# Patient Record
Sex: Male | Born: 1960 | Race: White | Hispanic: No | Marital: Married | Smoking: Never smoker
Health system: Southern US, Community
[De-identification: ages and names within clinical notes are randomized; demographics above are authoritative.]

## PROBLEM LIST (undated history)

## (undated) DIAGNOSIS — M419 Scoliosis, unspecified: Secondary | ICD-10-CM

## (undated) DIAGNOSIS — I10 Essential (primary) hypertension: Secondary | ICD-10-CM

## (undated) DIAGNOSIS — M4310 Spondylolisthesis, site unspecified: Secondary | ICD-10-CM

## (undated) DIAGNOSIS — C4490 Unspecified malignant neoplasm of skin, unspecified: Secondary | ICD-10-CM

## (undated) DIAGNOSIS — G8929 Other chronic pain: Secondary | ICD-10-CM

## (undated) DIAGNOSIS — G4733 Obstructive sleep apnea (adult) (pediatric): Secondary | ICD-10-CM

## (undated) DIAGNOSIS — F1090 Alcohol use, unspecified, uncomplicated: Secondary | ICD-10-CM

## (undated) DIAGNOSIS — M48062 Spinal stenosis, lumbar region with neurogenic claudication: Secondary | ICD-10-CM

## (undated) DIAGNOSIS — M109 Gout, unspecified: Secondary | ICD-10-CM

## (undated) DIAGNOSIS — L570 Actinic keratosis: Secondary | ICD-10-CM

## (undated) DIAGNOSIS — Z789 Other specified health status: Secondary | ICD-10-CM

## (undated) DIAGNOSIS — N529 Male erectile dysfunction, unspecified: Secondary | ICD-10-CM

## (undated) DIAGNOSIS — E785 Hyperlipidemia, unspecified: Secondary | ICD-10-CM

## (undated) DIAGNOSIS — R7303 Prediabetes: Secondary | ICD-10-CM

## (undated) HISTORY — PX: KNEE ARTHROSCOPY: SUR90

## (undated) HISTORY — DX: Obstructive sleep apnea (adult) (pediatric): G47.33

## (undated) HISTORY — DX: Actinic keratosis: L57.0

## (undated) HISTORY — PX: CARPAL TUNNEL RELEASE: SHX101

## (undated) HISTORY — PX: COLONOSCOPY W/ POLYPECTOMY: SHX1380

## (undated) HISTORY — DX: Hyperlipidemia, unspecified: E78.5

## (undated) HISTORY — DX: Unspecified malignant neoplasm of skin, unspecified: C44.90

## (undated) HISTORY — DX: Other specified health status: Z78.9

## (undated) HISTORY — DX: Alcohol use, unspecified, uncomplicated: F10.90

## (undated) HISTORY — DX: Male erectile dysfunction, unspecified: N52.9

## (undated) HISTORY — DX: Essential (primary) hypertension: I10

## (undated) HISTORY — PX: ROTATOR CUFF REPAIR: SHX139

## (undated) HISTORY — DX: Prediabetes: R73.03

## (undated) HISTORY — DX: Gout, unspecified: M10.9

## (undated) HISTORY — PX: JOINT REPLACEMENT: SHX530

## (undated) HISTORY — PX: KNEE ARTHROSCOPY W/ MENISCECTOMY: SHX1879

## (undated) HISTORY — PX: OLECRANON BURSECTOMY: SHX2097

## (undated) HISTORY — PX: REPLACEMENT TOTAL KNEE: SUR1224

---

## 2004-11-21 ENCOUNTER — Ambulatory Visit: Payer: Self-pay | Admitting: Internal Medicine

## 2008-04-06 ENCOUNTER — Ambulatory Visit: Payer: Self-pay | Admitting: Internal Medicine

## 2008-04-06 IMAGING — CR DG ABDOMEN 1V
1 series · 2 of 2 positions shown · non-contrast
Comparison: none

REASON FOR EXAM: abd pain
COMMENTS:

[Series 1: view not recorded · 0.17mm/px · 2 of 2 slices shown]
[im 1/2]
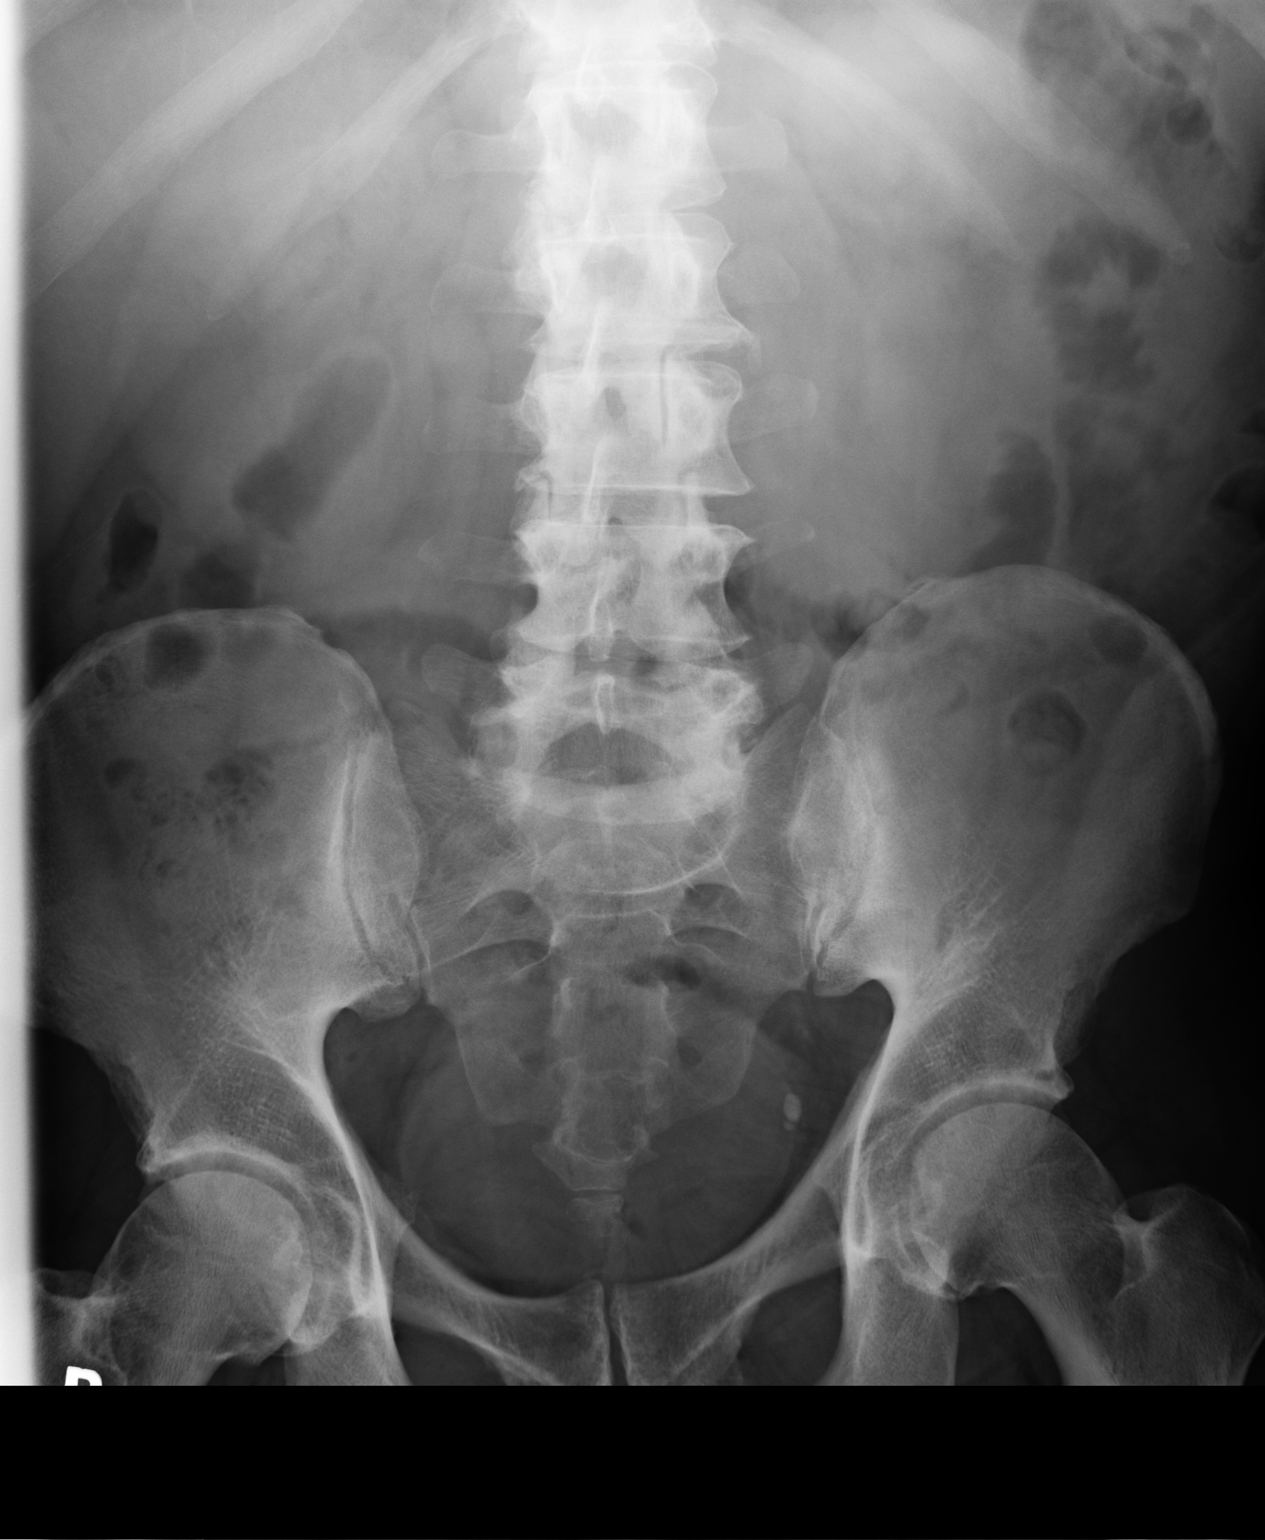
[im 2/2]
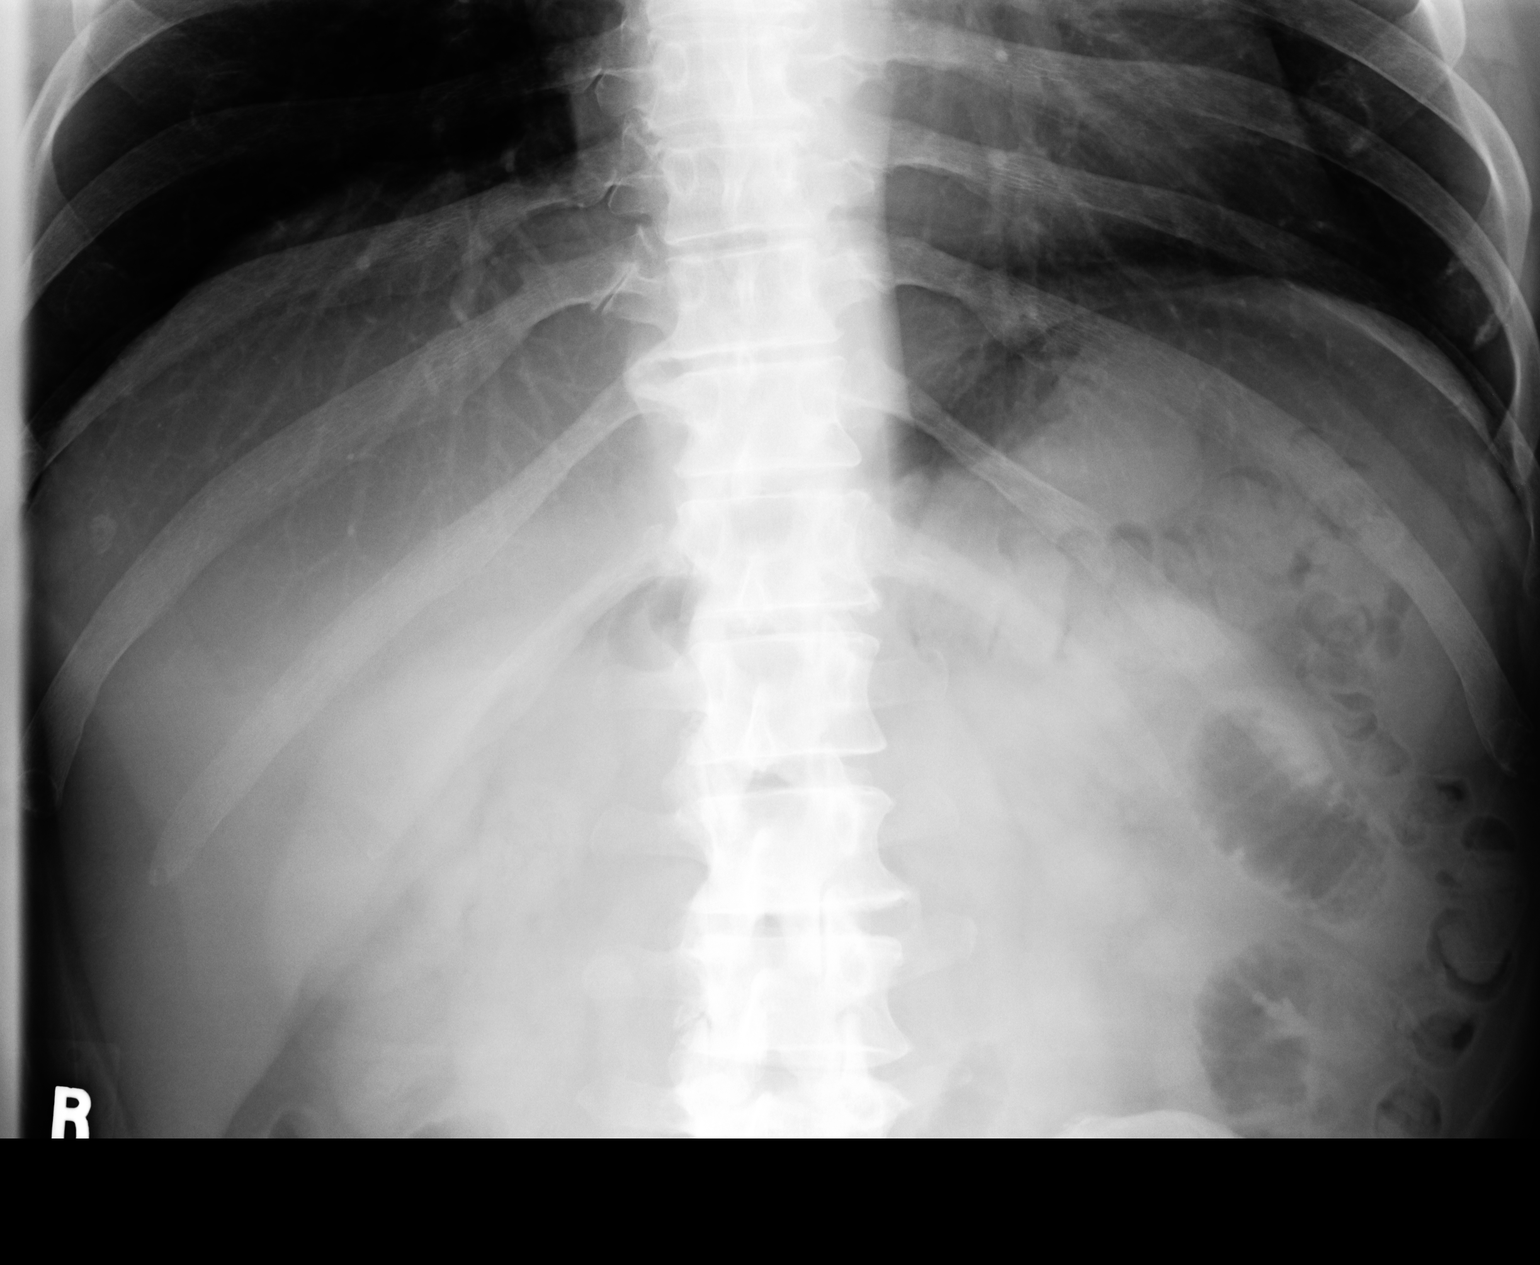

[2 of 2 positions shown; findings below may reference images not displayed]

PROCEDURE:     DXR - DXR KIDNEY URETER BLADDER  - April 06, 2008  [DATE]

RESULT:     The lumbar vertebral bodies exhibit very mild degenerative
change. There are two dense calcifications the left aspect of the pelvis
which likely reflect phleboliths, but the possibility of a distal left
ureteral stone is raised. A smaller calcification is seen just inferior to
the  larger calcification. The largest calcification measures roughly 7 mm
in greatest dimension while the smaller measures roughly 2 mm in greatest
dimension. The bowel gas pattern exhibits no acute abnormality.
IMPRESSION: There are calcifications in left aspect of the pelvis which
may reflect the presence of 1 or 2 stones measuring 2 mm and/or 7 mm
respectively. Followup IVP or CT scanning would be a useful next step given
that there is abdominal discomfort.

Attempts were made to call this report, but all receptionists were busy.

## 2008-04-06 IMAGING — US ABDOMEN ULTRASOUND
1 series · 17 of 25 positions shown · non-contrast
Comparison: none

REASON FOR EXAM: abdominal pain      call report  [phone]   [phone]
pager  [phone]
COMMENTS:

[Series 1: abdomen ultrasound · 17 of 85 slices shown]
[im 1/85]
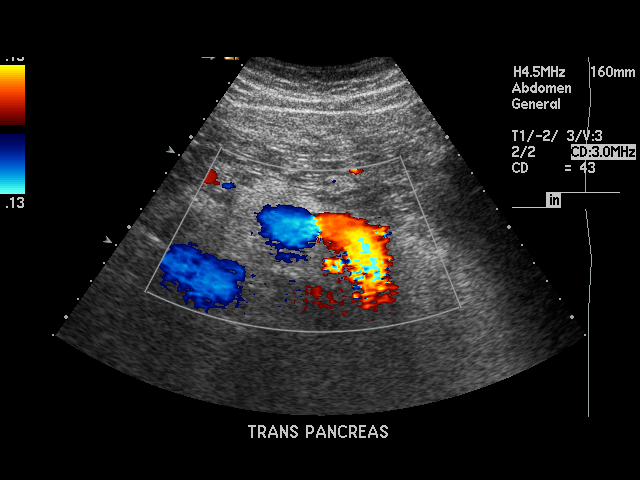
[im 8/85]
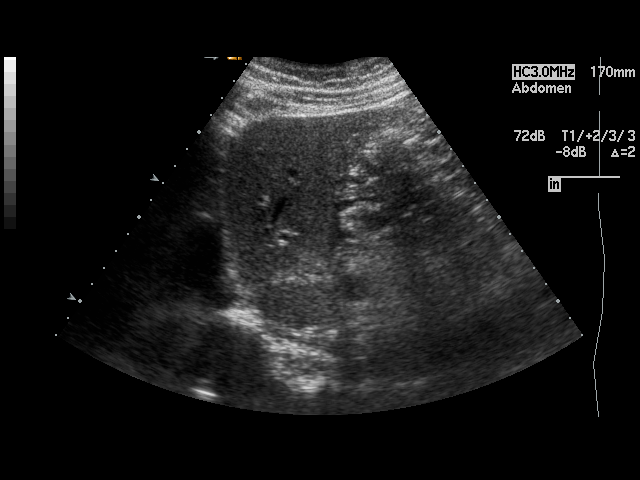
[im 11/85]
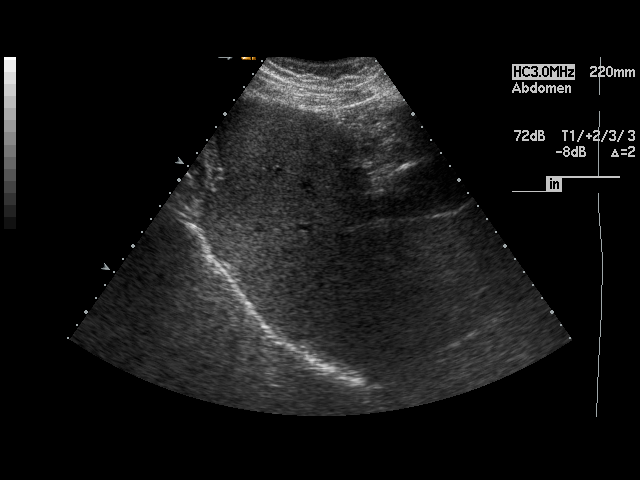
[im 18/85]
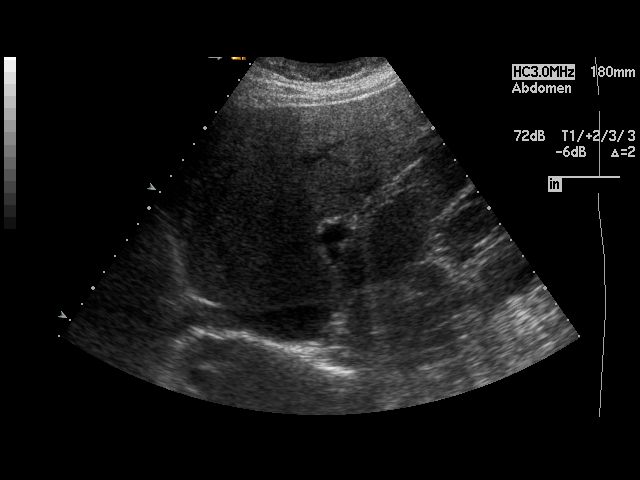
[im 22/85]
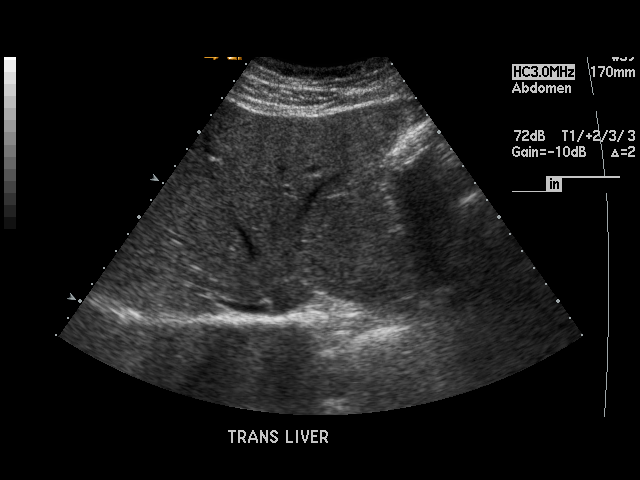
[im 29/85]
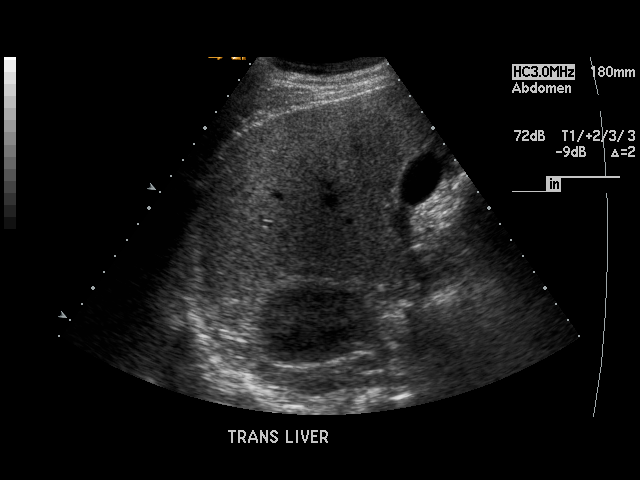
[im 32/85]
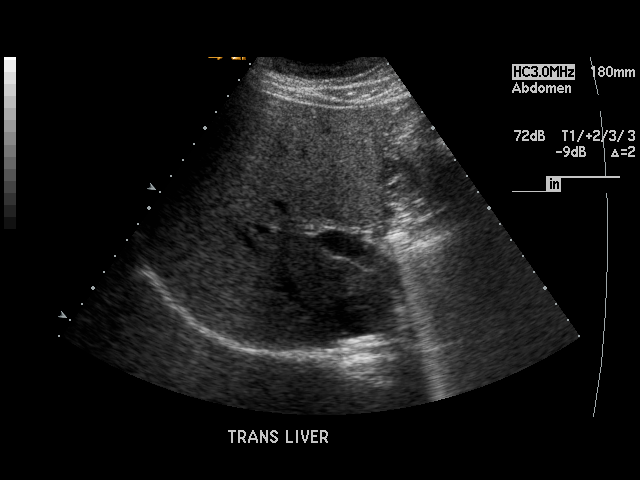
[im 39/85]
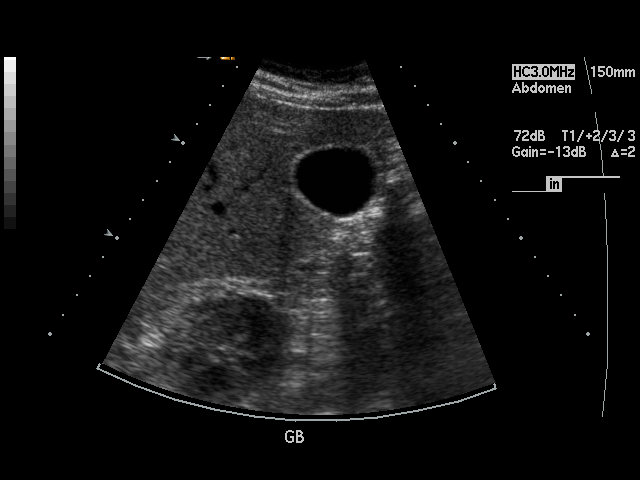
[im 43/85]
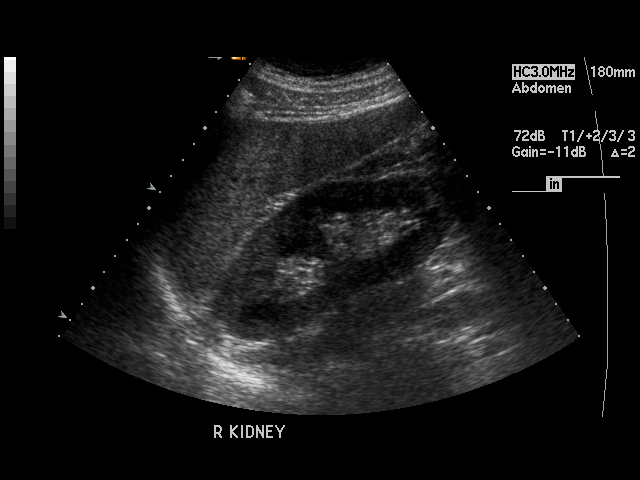
[im 46/85]
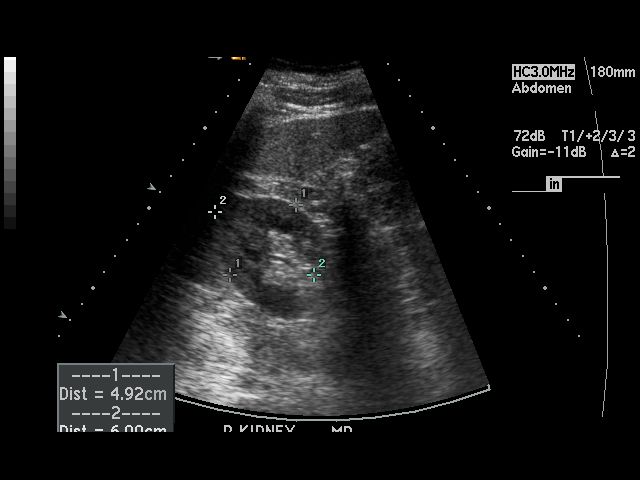
[im 53/85]
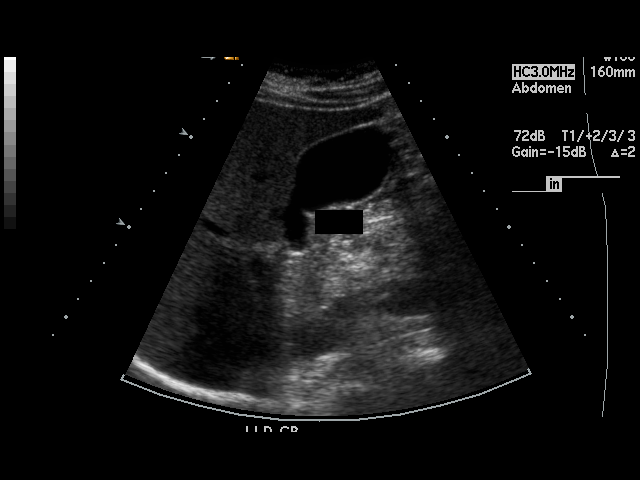
[im 57/85]
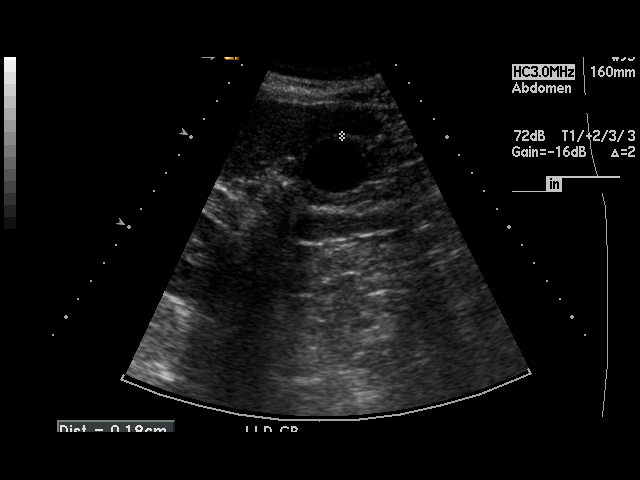
[im 64/85]
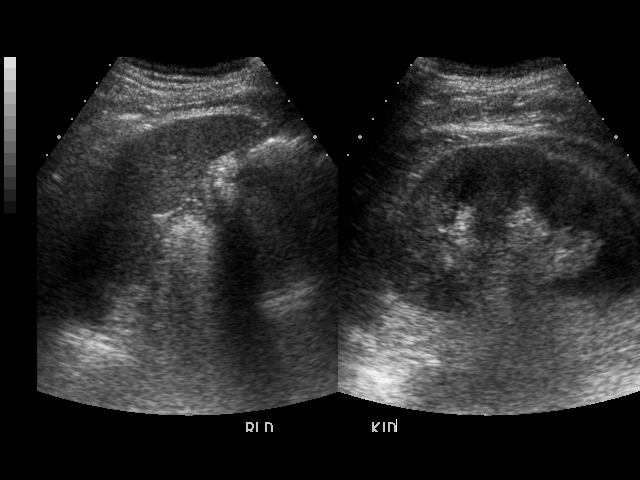
[im 67/85]
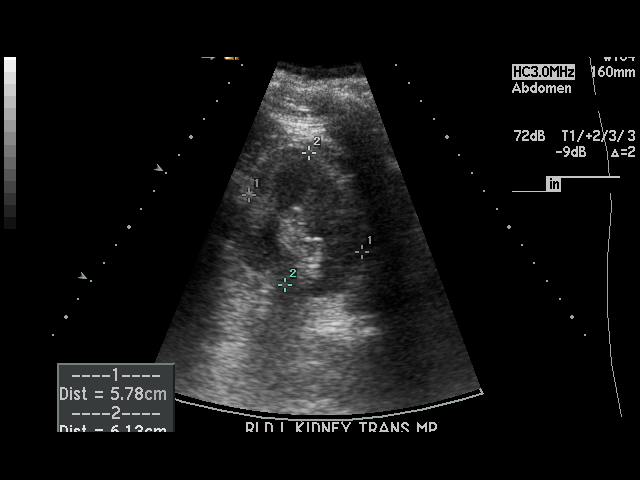
[im 74/85]
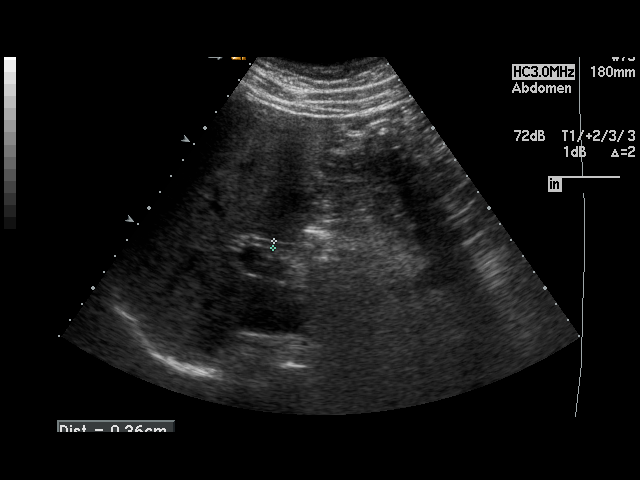
[im 78/85]
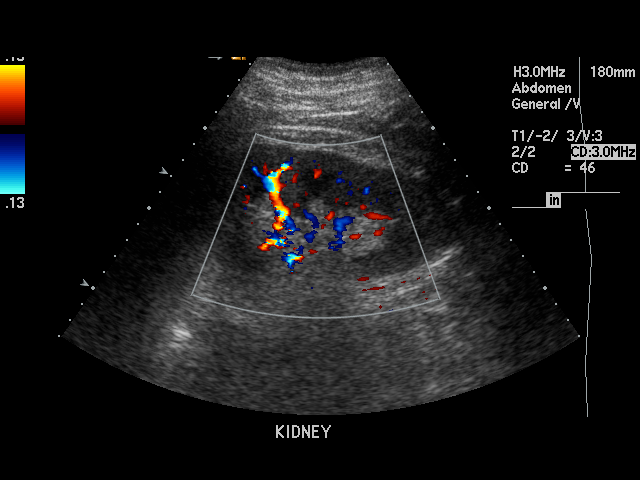
[im 85/85]
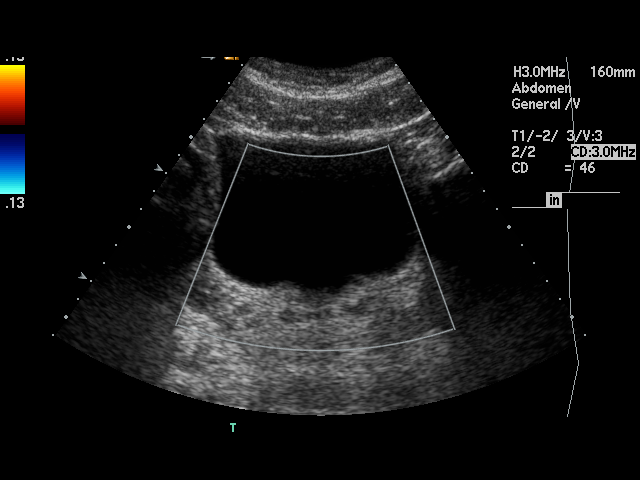

[17 of 25 positions shown; findings below may reference images not displayed]

PROCEDURE:     US  - US ABDOMEN GENERAL SURVEY  - April 06, 2008  [DATE]

RESULT:     The liver exhibits normal echotexture with no focal mass nor
ductal dilation. Portal venous flow is normal in direction toward the liver.
The gallbladder is adequately distended with no evidence of stones, wall
thickening, or pericholecystic fluid. The common bile duct is normal at
mm in diameter. There is no sonographic Murphy's sign.

The pancreas, spleen, abdominal aorta, and kidneys are normal in appearance.
IMPRESSION: Normal abdominal ultrasound examination.

## 2008-04-09 ENCOUNTER — Ambulatory Visit: Payer: Self-pay | Admitting: Internal Medicine

## 2008-04-09 IMAGING — CT CT STONE STUDY
1 of 2 series · 16 of 32 positions shown, 20 images · non-contrast
Comparison: none

REASON FOR EXAM: abd and flank pain, elev creatinine and  hematuria  call
COMMENTS:

PROCEDURE:     CT  - CT ABDOMEN /PELVIS WO (STONE)  - April 09, 2008  [DATE]
RESULT:
HISTORY: LEFT flank pain.

[Series 2: stone · axial · 0.77mm/px · z∈[-985,-535]mm · 16 of 164 slices shown, 20 images]
[im 7/164  soft-tissue]
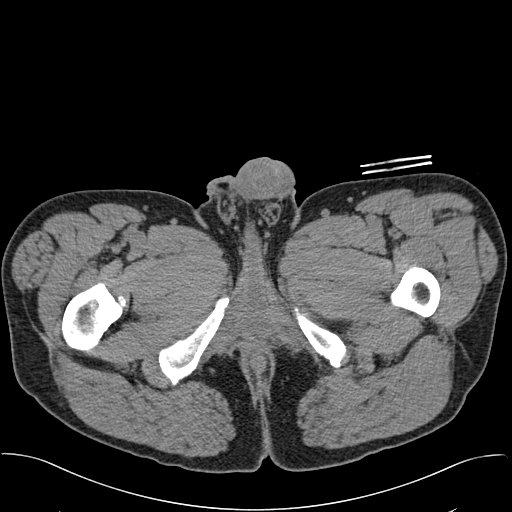
[im 7/164  bone]
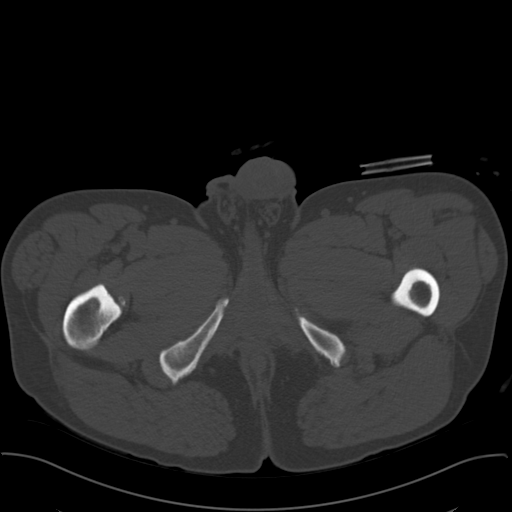
[im 20/164  soft-tissue]
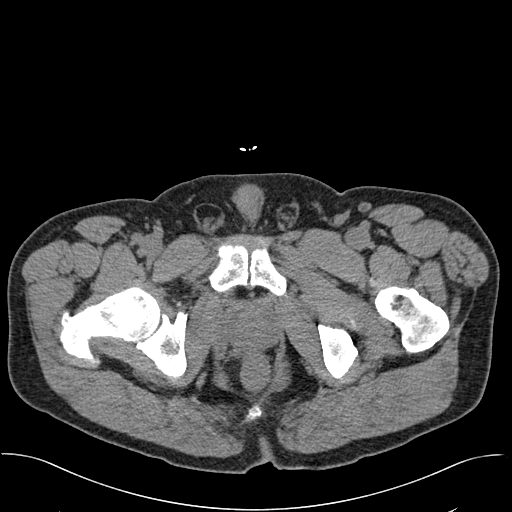
[im 33/164  soft-tissue]
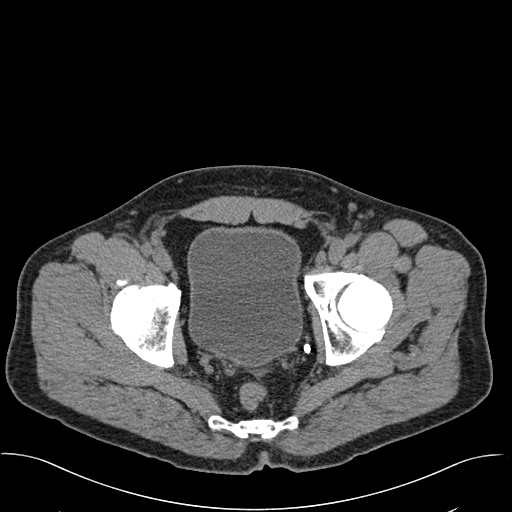
[im 46/164  soft-tissue]
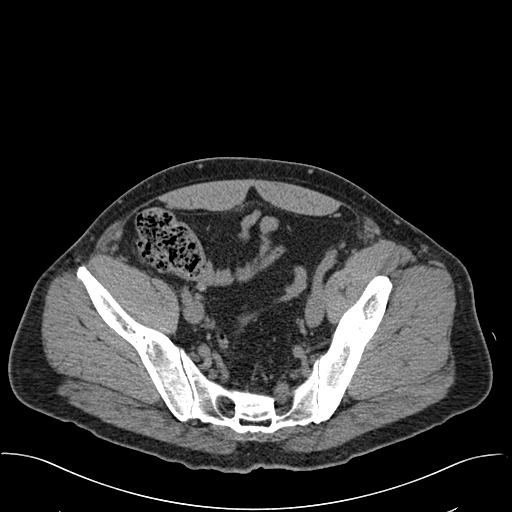
[im 53/164  soft-tissue]
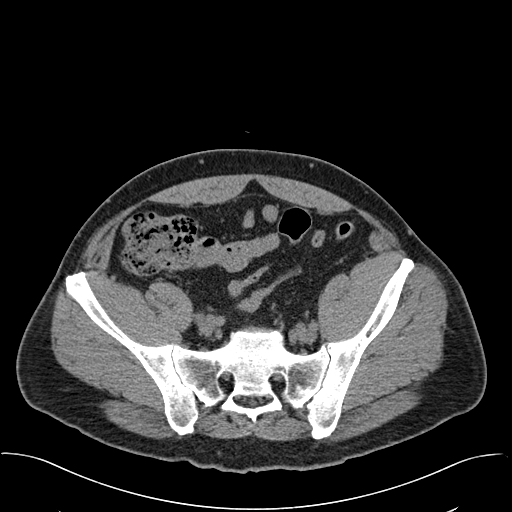
[im 66/164  soft-tissue]
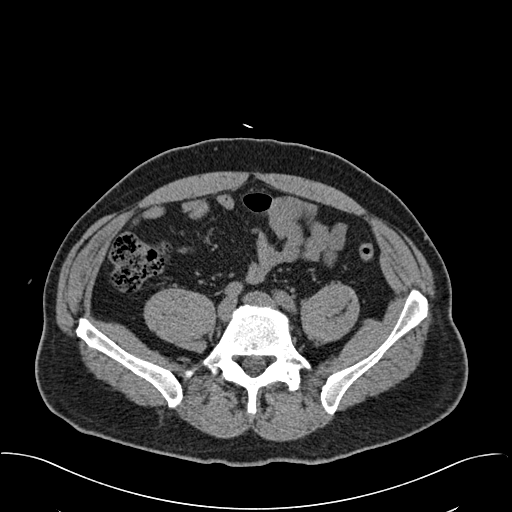
[im 79/164  soft-tissue]
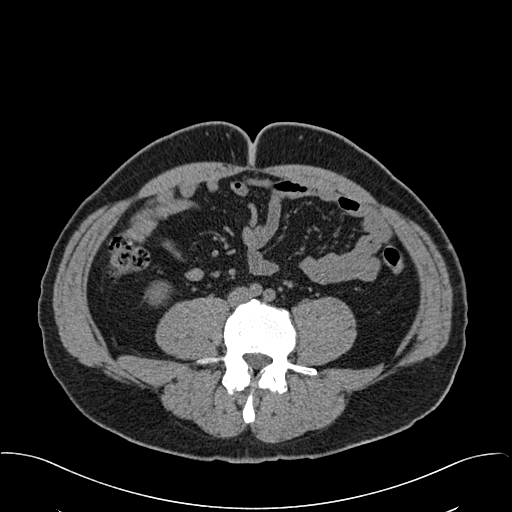
[im 85/164  soft-tissue]
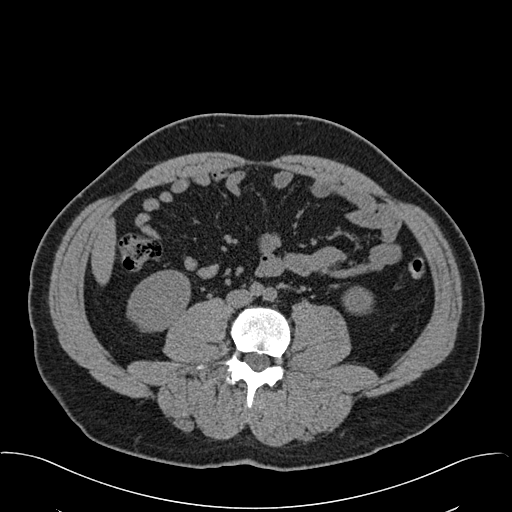
[im 98/164  soft-tissue]
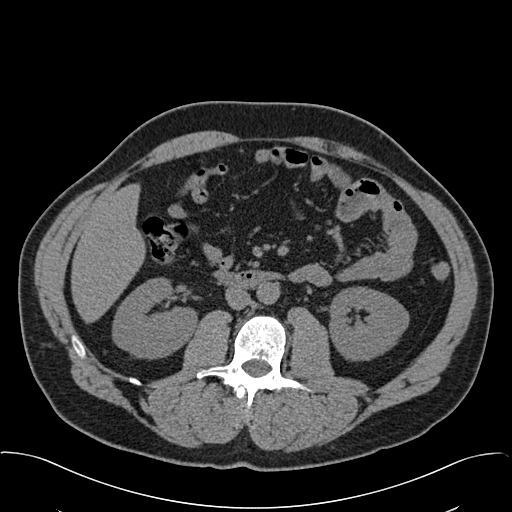
[im 98/164  bone]
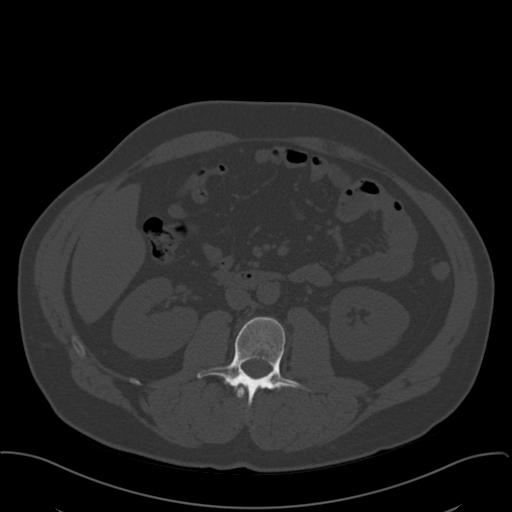
[im 111/164  soft-tissue]
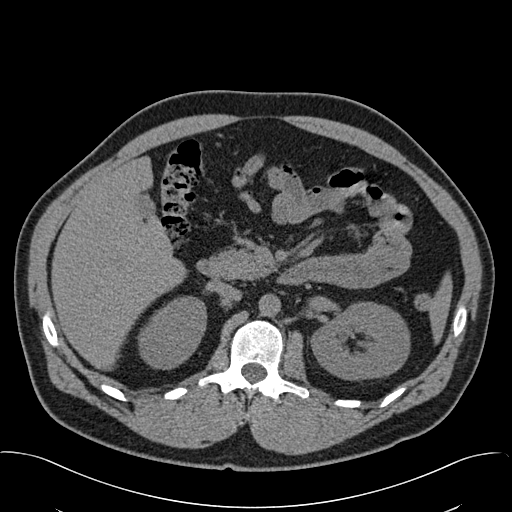
[im 124/164  soft-tissue]
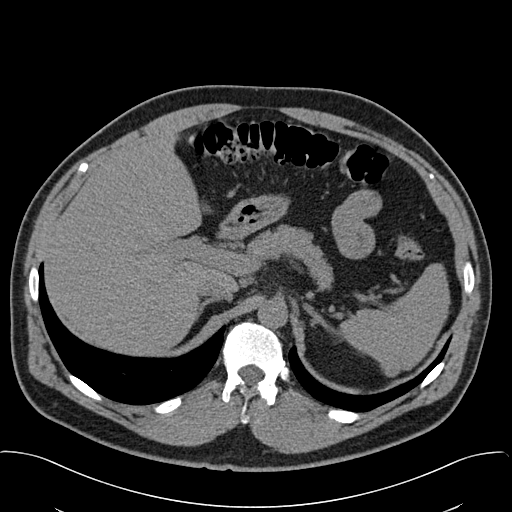
[im 131/164  soft-tissue]
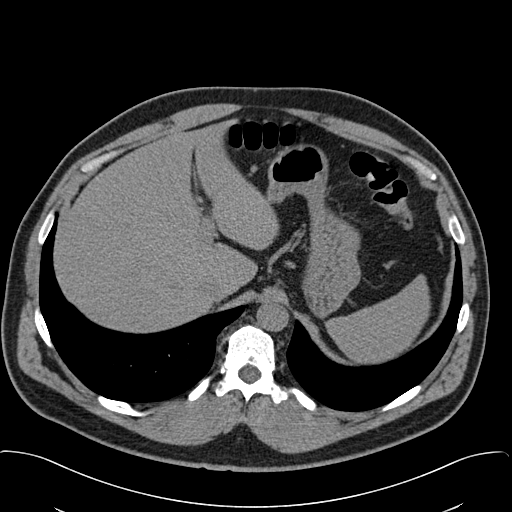
[im 137/164  lung]
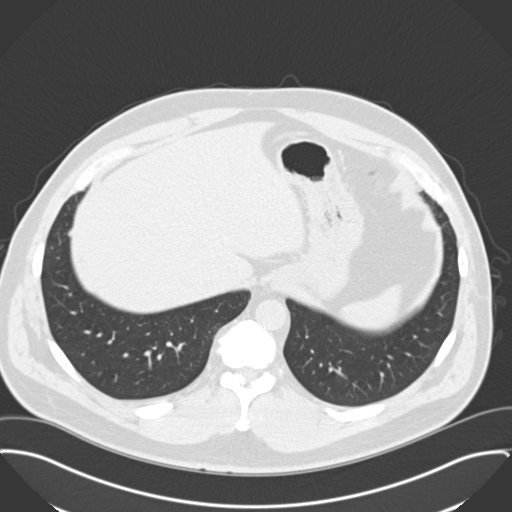
[im 144/164  soft-tissue]
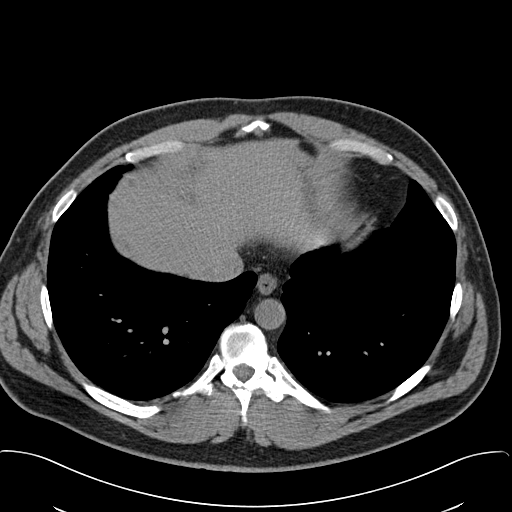
[im 144/164  lung]
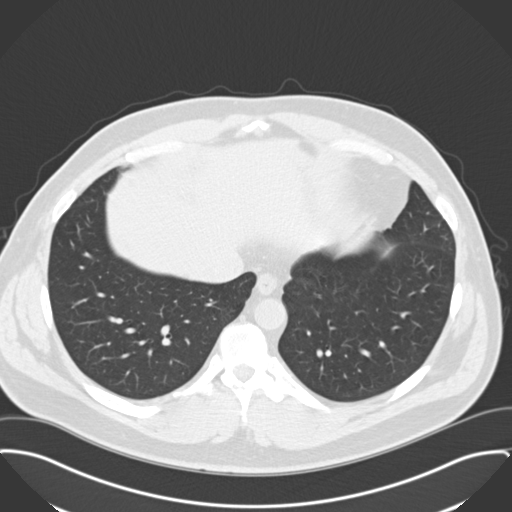
[im 150/164  lung]
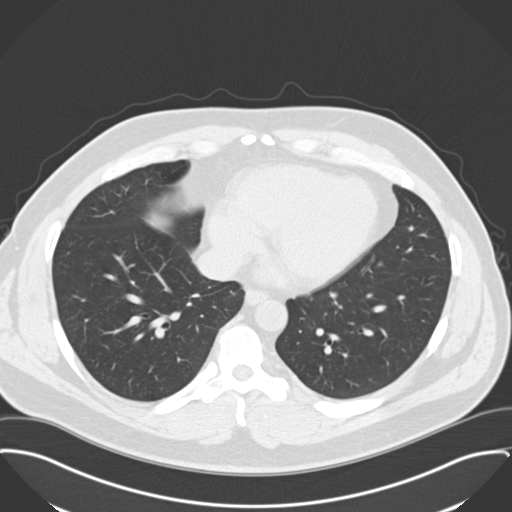
[im 157/164  soft-tissue]
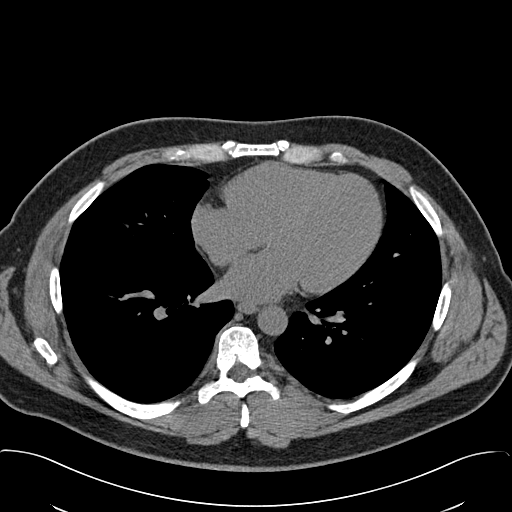
[im 157/164  lung]
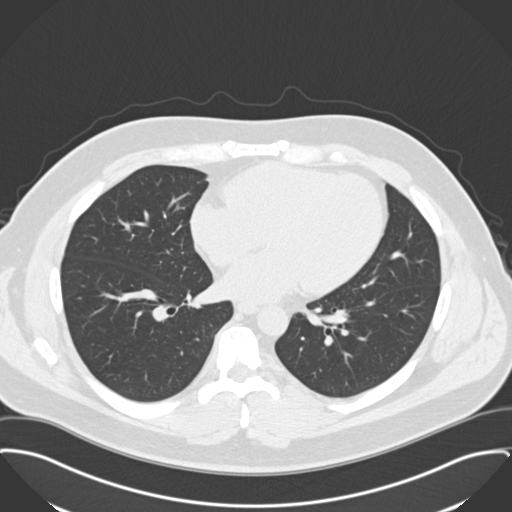

[16 of 32 positions shown; findings below may reference images not displayed]

COMPARISON STUDIES:  No recent.

PROCEDURE AND FINDINGS:   Nonenhanced CT of the abdomen and pelvis was
obtained.  The liver and spleen are normal.  The pancreas is normal. The
adrenals are normal. No hydronephrosis is noted. No ureteral stones are
noted. The bladder is unremarkable. No inguinal adenopathy is noted. The
appendix is normal.  Bilateral inguinal hernias are noted. The lung bases
are clear.
IMPRESSION: No acute or focal abnormality is identified.

## 2011-07-03 DIAGNOSIS — I10 Essential (primary) hypertension: Secondary | ICD-10-CM | POA: Insufficient documentation

## 2011-07-03 DIAGNOSIS — E669 Obesity, unspecified: Secondary | ICD-10-CM | POA: Insufficient documentation

## 2011-07-03 DIAGNOSIS — Z789 Other specified health status: Secondary | ICD-10-CM | POA: Insufficient documentation

## 2011-07-03 DIAGNOSIS — E782 Mixed hyperlipidemia: Secondary | ICD-10-CM | POA: Insufficient documentation

## 2012-07-15 ENCOUNTER — Emergency Department: Payer: Self-pay | Admitting: Emergency Medicine

## 2012-07-15 ENCOUNTER — Ambulatory Visit: Payer: Self-pay | Admitting: Family Medicine

## 2012-07-15 LAB — CBC WITH DIFFERENTIAL/PLATELET
Basophil #: 0.1 10*3/uL (ref 0.0–0.1)
Basophil %: 1 %
Eosinophil #: 0.2 10*3/uL (ref 0.0–0.7)
Eosinophil %: 3.3 %
Lymphocyte #: 1.7 10*3/uL (ref 1.0–3.6)
Lymphocyte %: 31.7 %
MCH: 30.7 pg (ref 26.0–34.0)
MCV: 90 fL (ref 80–100)
Monocyte #: 0.5 x10 3/mm (ref 0.2–1.0)
Monocyte %: 9 %
Neutrophil #: 2.9 10*3/uL (ref 1.4–6.5)
RBC: 4.76 10*6/uL (ref 4.40–5.90)
RDW: 13.4 % (ref 11.5–14.5)
WBC: 5.3 10*3/uL (ref 3.8–10.6)

## 2012-07-15 LAB — BASIC METABOLIC PANEL
Anion Gap: 10 (ref 7–16)
BUN: 18 mg/dL (ref 7–18)
Chloride: 103 mmol/L (ref 98–107)
Co2: 25 mmol/L (ref 21–32)
Creatinine: 1.08 mg/dL (ref 0.60–1.30)
EGFR (African American): >60
Osmolality: 277 (ref 275–301)
Potassium: 3.8 mmol/L (ref 3.5–5.1)
Sodium: 138 mmol/L (ref 136–145)

## 2012-07-15 LAB — CREATININE, SERUM: EGFR (Non-African Amer.): >60

## 2012-07-15 LAB — LIPASE, BLOOD: Lipase: 176 U/L (ref 73–393)

## 2012-07-15 IMAGING — CT CT ABD-PELV W/ CM
1 of 2 series · 15 of 32 positions shown, 19 images · IV contrast (isovue)
Comparison: none

REASON FOR EXAM: Left Lower abdominal pain, possible diverticulitis.
COMMENTS:

PROCEDURE:     CT  - CT ABDOMEN / PELVIS  W  - July 15, 2012 [DATE]
RESULT:     Comparison:  04/09/2008
TECHNIQUE: Multiple axial images of the abdomen and pelvis were performed
from the lung bases to the pubic symphysis, with p.o. contrast and with 100
mL of Isovue 370 intravenous contrast.

[Series 2: 3mm soft tissue · axial · 0.86mm/px · z∈[-544,-115]mm · 15 of 157 slices shown, 19 images]
[im 7/157  soft-tissue]
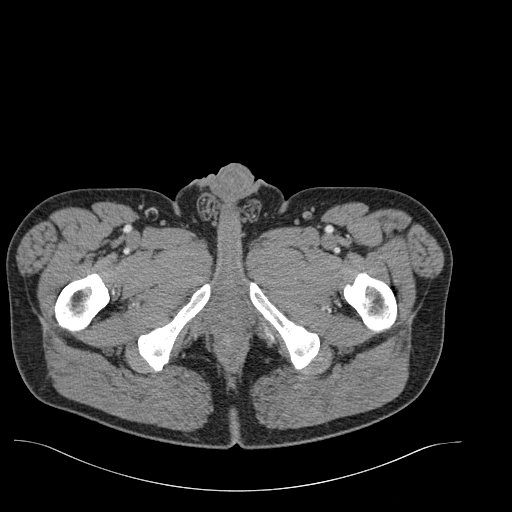
[im 7/157  bone]
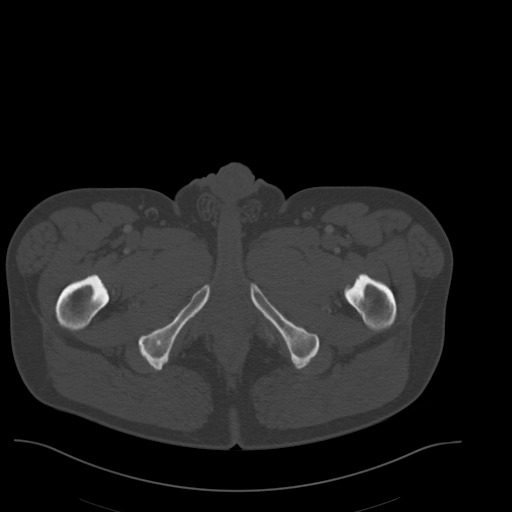
[im 20/157  soft-tissue]
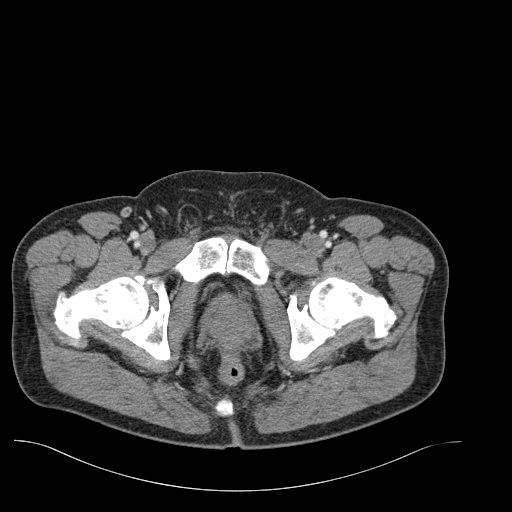
[im 33/157  soft-tissue]
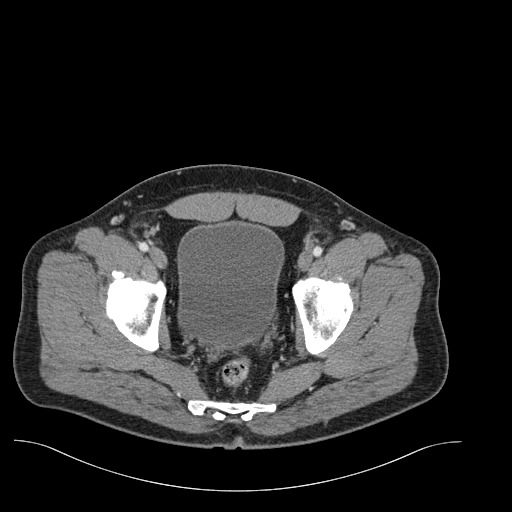
[im 46/157  soft-tissue]
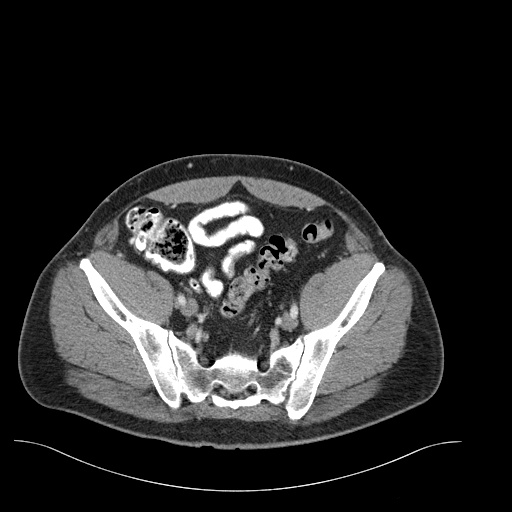
[im 53/157  soft-tissue]
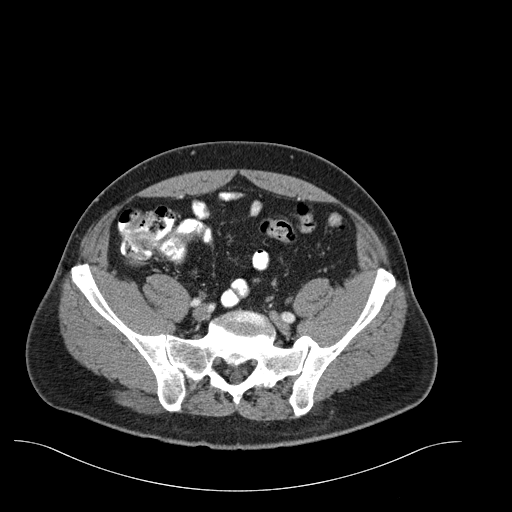
[im 66/157  soft-tissue]
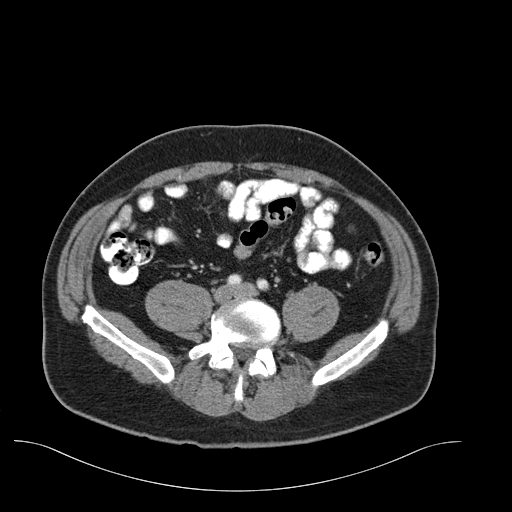
[im 79/157  soft-tissue]
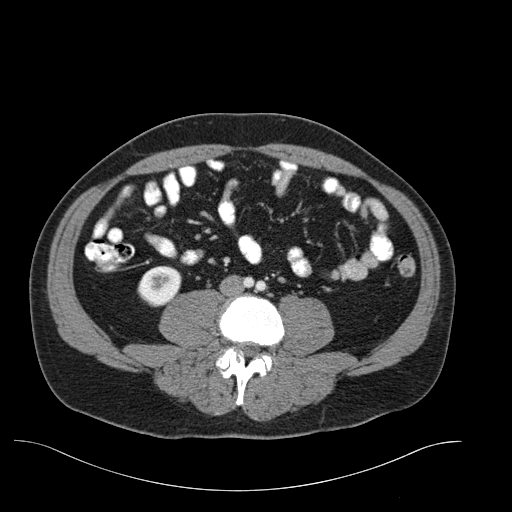
[im 92/157  soft-tissue]
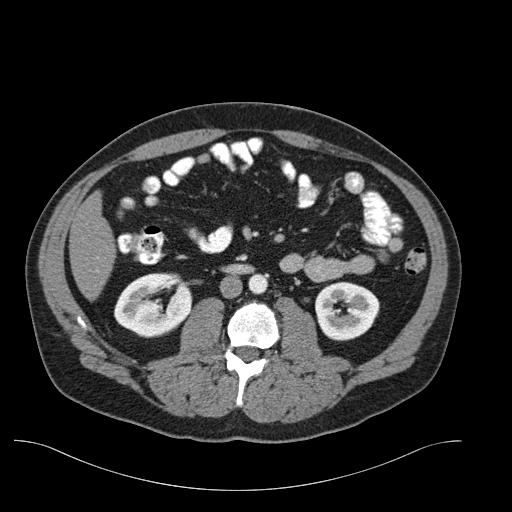
[im 105/157  soft-tissue]
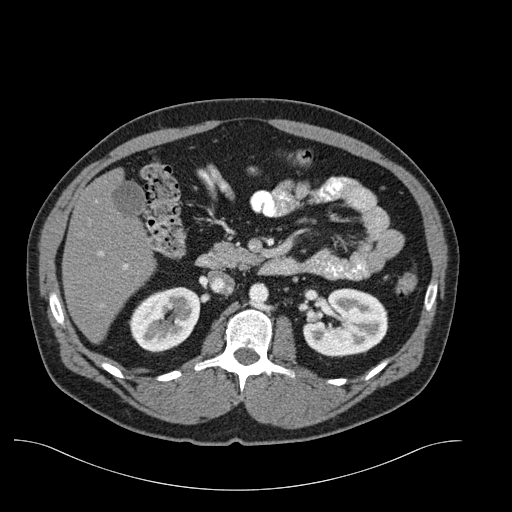
[im 105/157  bone]
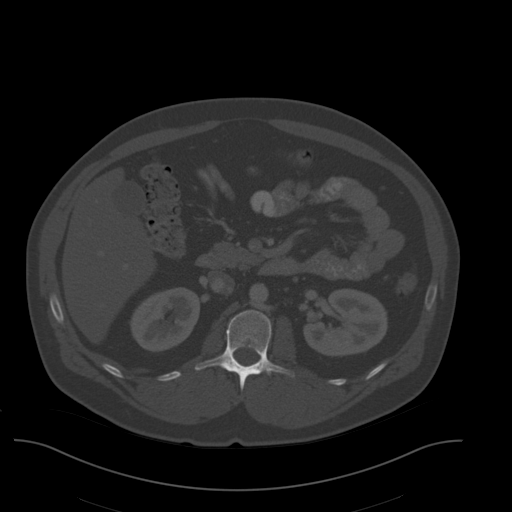
[im 111/157  soft-tissue]
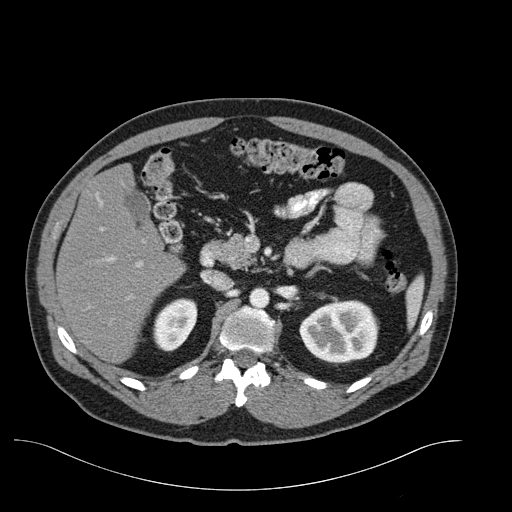
[im 124/157  soft-tissue]
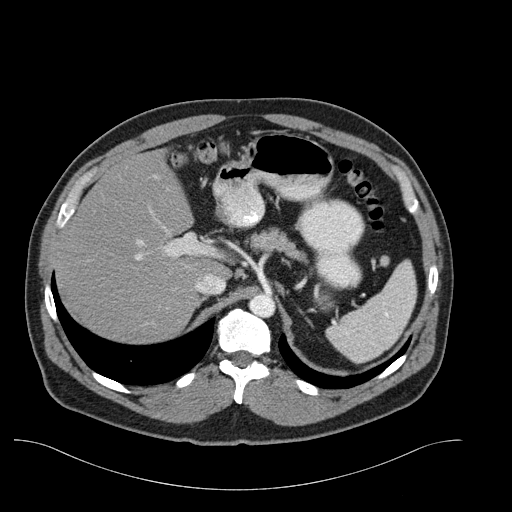
[im 131/157  lung]
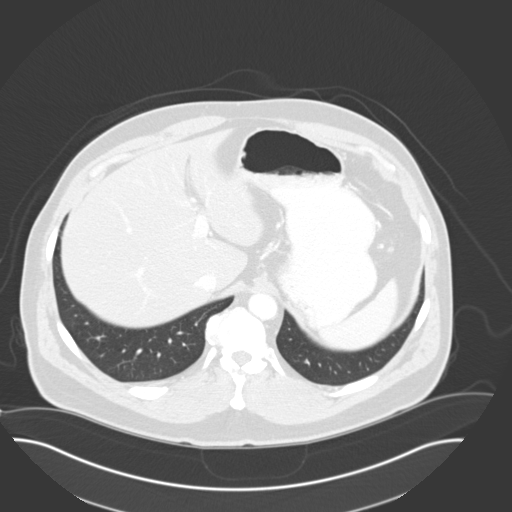
[im 137/157  soft-tissue]
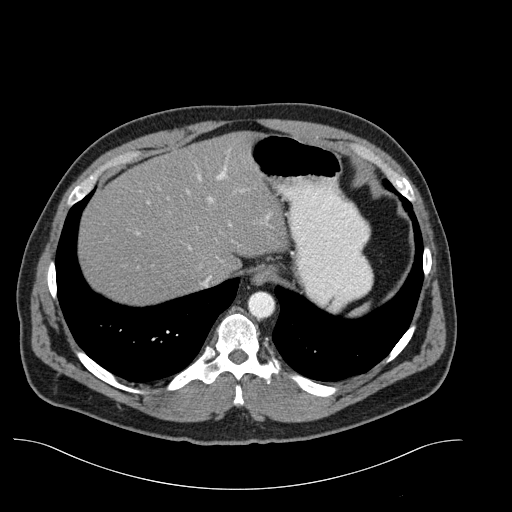
[im 137/157  lung]
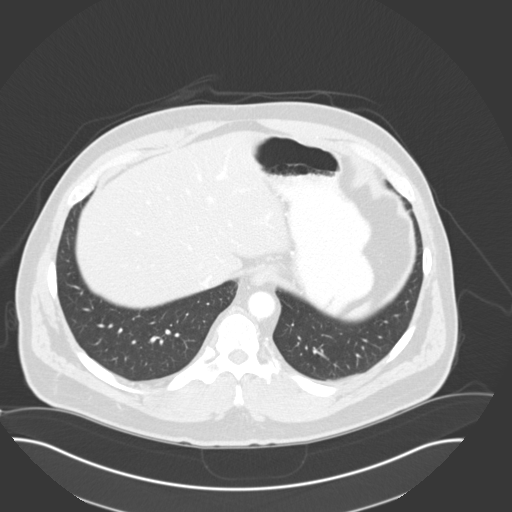
[im 144/157  lung]
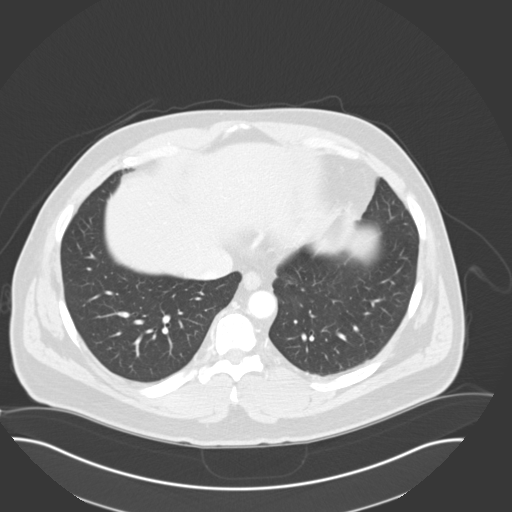
[im 150/157  soft-tissue]
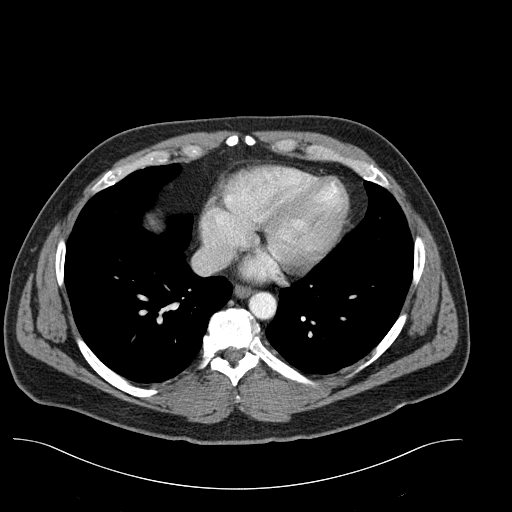
[im 150/157  lung]
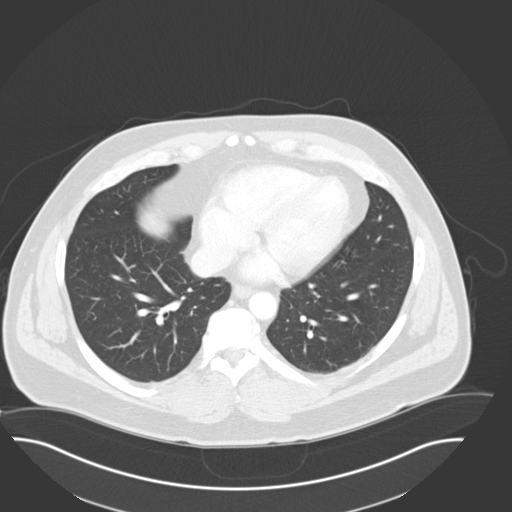

[15 of 32 positions shown; findings below may reference images not displayed]

FINDINGS: The liver is diffusely low in attenuation, consistent with hepatic
steatosis. The gallbladder, spleen, adrenals, and pancreas are unremarkable.
Tiny low-attenuation focus in the right kidney is too small to characterize.
No hydronephrosis.

The small and large bowel are normal in caliber. The appendix measures 7-8
mm in diameter. However, there are no adjacent inflammatory changes and
there is oral contrast within the appendix.

No aggressive lytic or sclerotic osseous lesions are identified.
IMPRESSION: 1. The appendix is mildly enlarged by size criteria. However, there are no
adjacent inflammatory changes and there is oral contrast material within the
appendix. However, correlate for early appendicitis.
2. Otherwise, no acute findings in the abdomen or pelvis.

## 2012-11-03 DIAGNOSIS — M109 Gout, unspecified: Secondary | ICD-10-CM | POA: Insufficient documentation

## 2013-10-01 DIAGNOSIS — G5603 Carpal tunnel syndrome, bilateral upper limbs: Secondary | ICD-10-CM | POA: Insufficient documentation

## 2015-04-04 ENCOUNTER — Ambulatory Visit
Admission: RE | Admit: 2015-04-04 | Discharge: 2015-04-04 | Disposition: A | Discharge status: Home / Self Care | Payer: 59 | Attending: Family Medicine | Admitting: Family Medicine

## 2015-04-04 ENCOUNTER — Ambulatory Visit (HOSPITAL_COMMUNITY)
Admission: RE | Admit: 2015-04-04 | Discharge: 2015-04-04 | Disposition: A | Discharge status: Home / Self Care | Payer: 59 | Source: Ambulatory Visit | Attending: Family Medicine | Admitting: Family Medicine

## 2015-04-04 ENCOUNTER — Ambulatory Visit: Admission: RE | Admit: 2015-04-04 | Payer: 59 | Source: Ambulatory Visit | Admitting: *Deleted

## 2015-04-04 ENCOUNTER — Other Ambulatory Visit: Payer: Self-pay | Admitting: Family Medicine

## 2015-04-04 ENCOUNTER — Ambulatory Visit
Admission: RE | Admit: 2015-04-04 | Discharge: 2015-04-04 | Disposition: A | Discharge status: Home / Self Care | Payer: 59 | Source: Ambulatory Visit | Attending: Family Medicine | Admitting: Family Medicine

## 2015-04-04 DIAGNOSIS — R05 Cough: Secondary | ICD-10-CM | POA: Diagnosis not present

## 2015-04-04 DIAGNOSIS — R059 Cough, unspecified: Secondary | ICD-10-CM

## 2015-04-04 IMAGING — CR DG CHEST 2V
2 series · 2 of 2 positions shown · non-contrast
Comparison: Report of a chest x-ray dated March 31, 2003.

CLINICAL DATA: Cough

EXAM:
CHEST  2 VIEW

[chest pa]
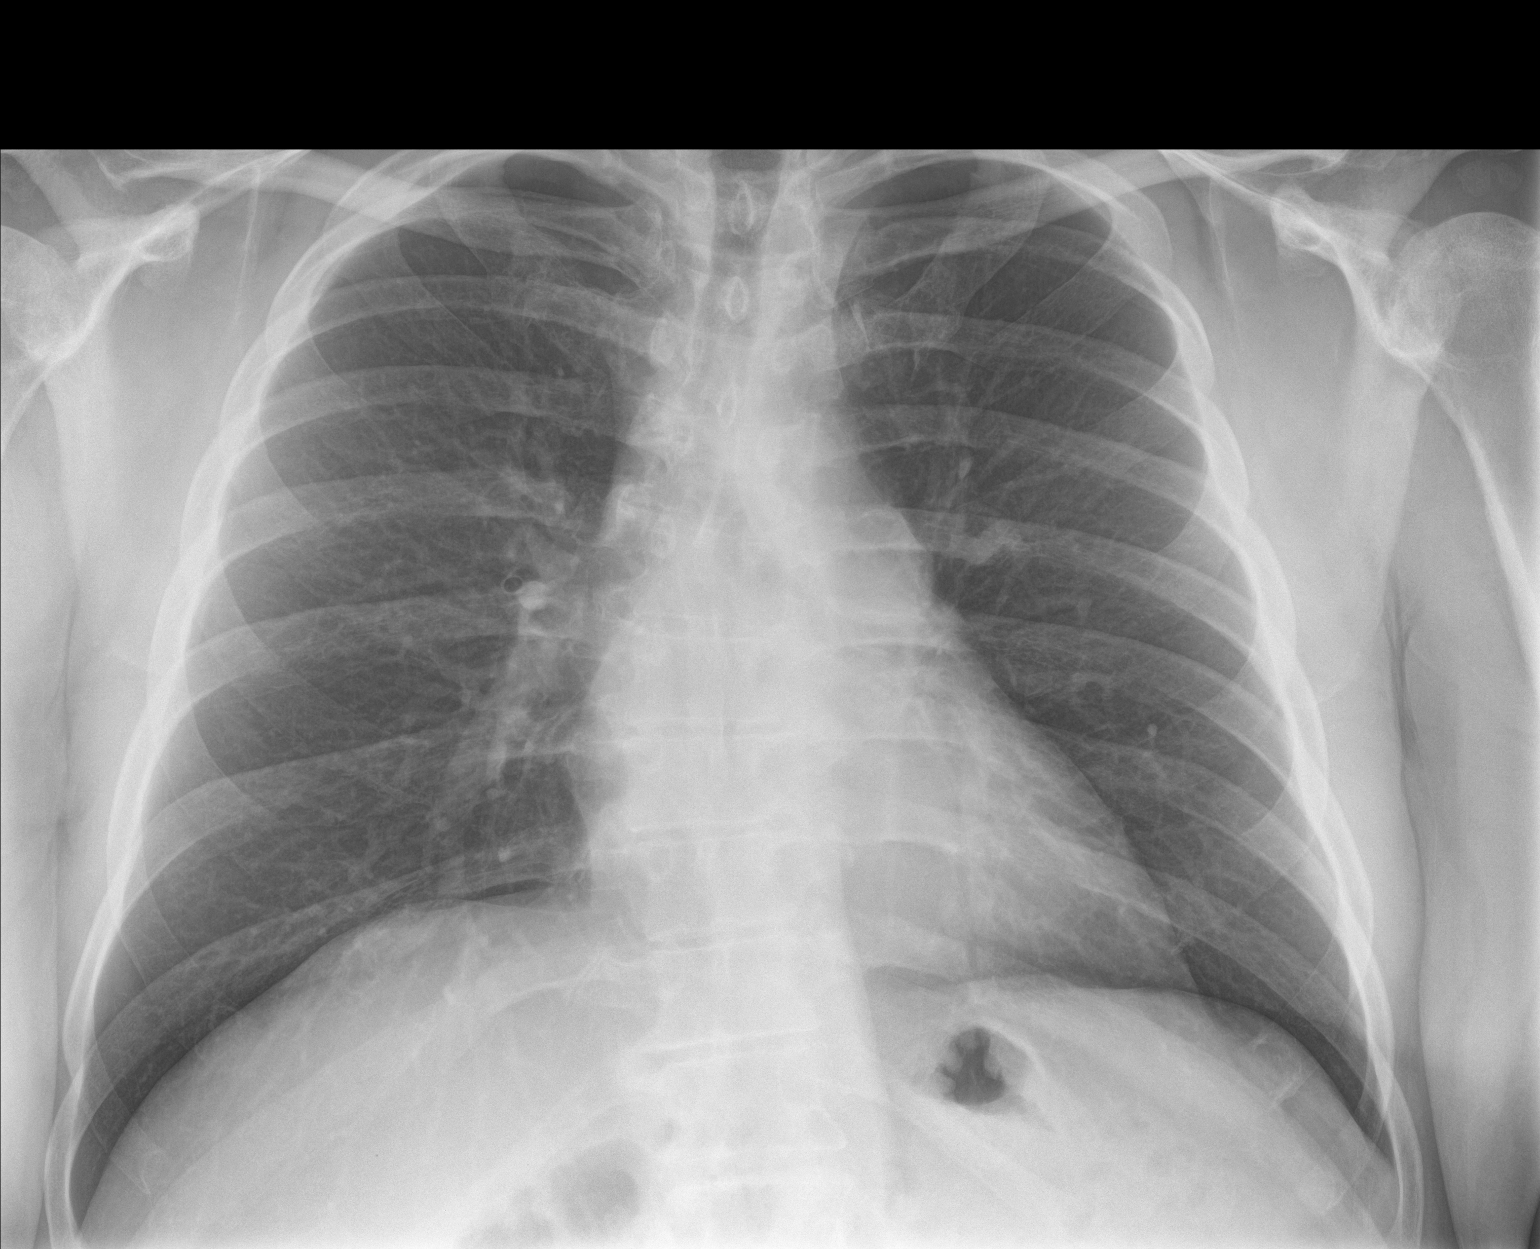

[chest lat]
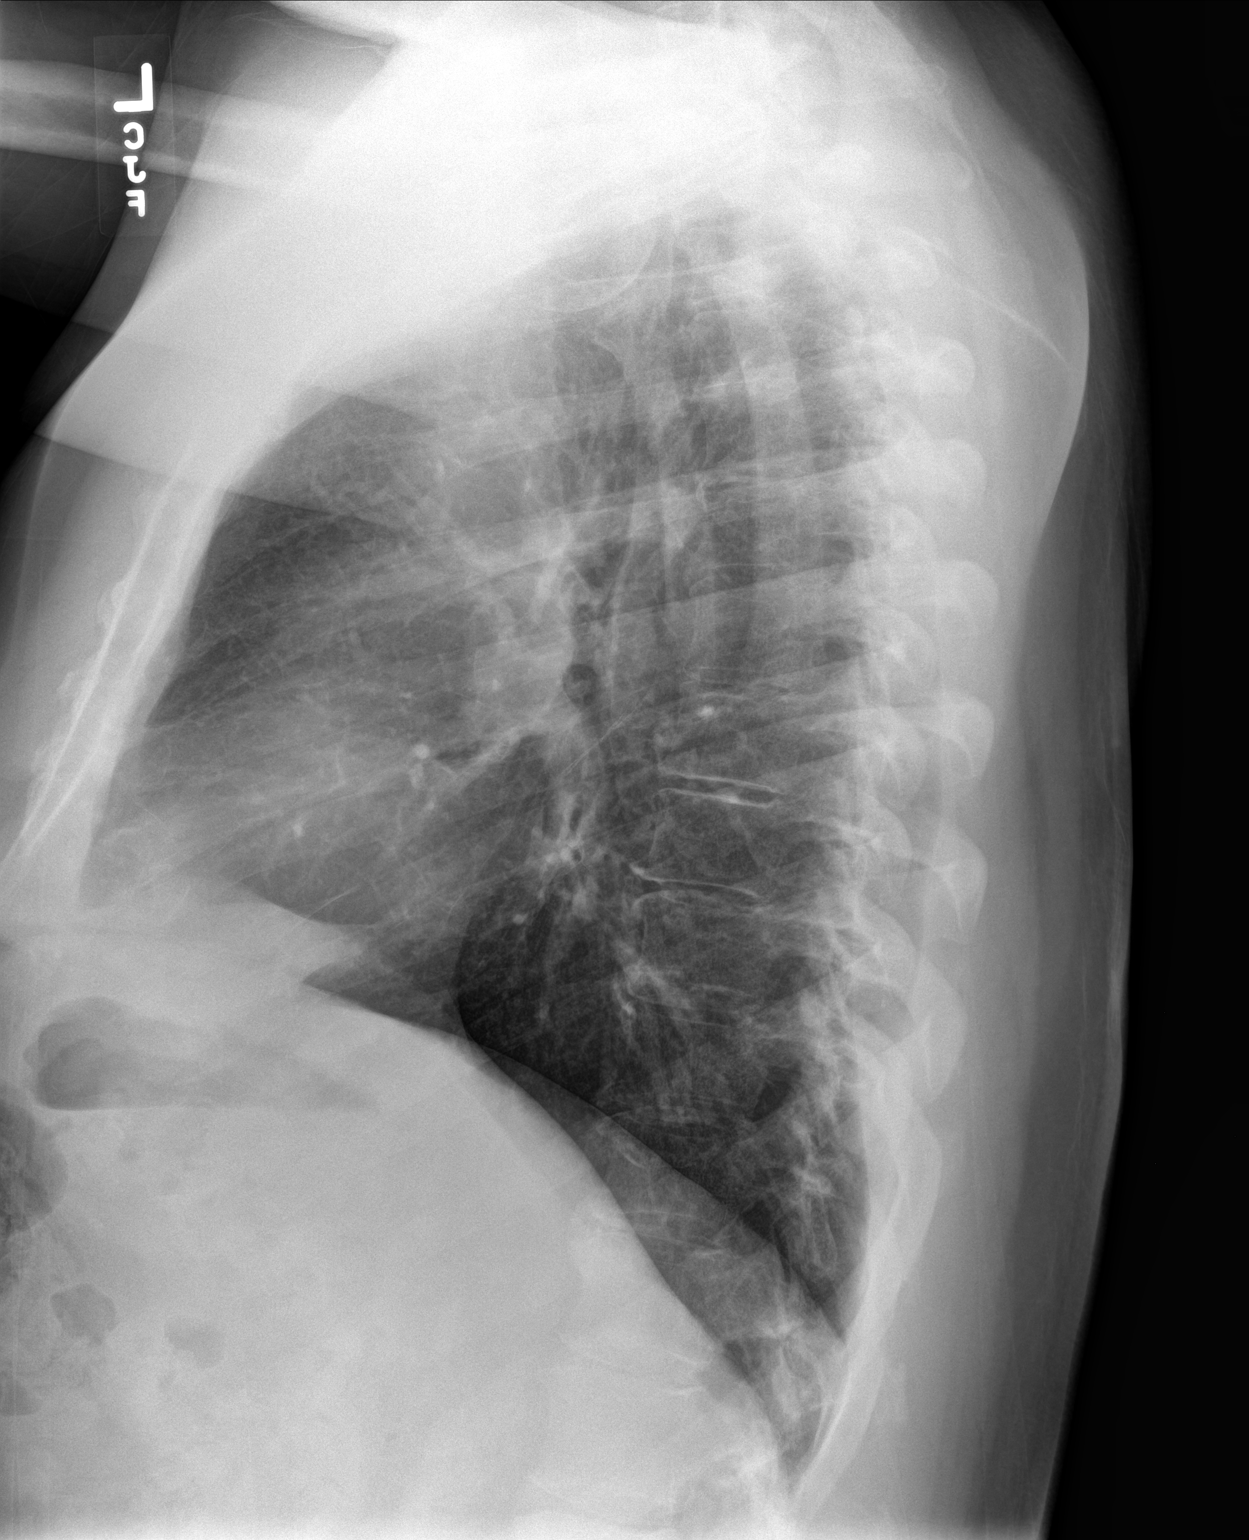

[2 of 2 positions shown; findings below may reference images not displayed]

FINDINGS: The lungs are adequately inflated and clear. The heart and pulmonary
vascularity are normal. The mediastinum is normal in width. There is
no pleural effusion. There is gentle dextrocurvature of the mid
lumbar spine.
IMPRESSION: There is no pneumonia nor other acute cardiopulmonary abnormality.

## 2016-05-04 DIAGNOSIS — Z860101 Personal history of adenomatous and serrated colon polyps: Secondary | ICD-10-CM | POA: Insufficient documentation

## 2016-05-04 DIAGNOSIS — Z86010 Personal history of colonic polyps: Secondary | ICD-10-CM | POA: Insufficient documentation

## 2016-06-05 HISTORY — PX: OTHER SURGICAL HISTORY: SHX169

## 2016-06-26 HISTORY — PX: OTHER SURGICAL HISTORY: SHX169

## 2017-09-06 HISTORY — PX: OTHER SURGICAL HISTORY: SHX169

## 2018-01-22 DIAGNOSIS — M7520 Bicipital tendinitis, unspecified shoulder: Secondary | ICD-10-CM | POA: Insufficient documentation

## 2018-01-22 DIAGNOSIS — M179 Osteoarthritis of knee, unspecified: Secondary | ICD-10-CM | POA: Insufficient documentation

## 2018-01-22 DIAGNOSIS — M719 Bursopathy, unspecified: Secondary | ICD-10-CM | POA: Insufficient documentation

## 2018-01-22 DIAGNOSIS — M1710 Unilateral primary osteoarthritis, unspecified knee: Secondary | ICD-10-CM | POA: Insufficient documentation

## 2018-01-22 DIAGNOSIS — M542 Cervicalgia: Secondary | ICD-10-CM | POA: Insufficient documentation

## 2018-01-22 DIAGNOSIS — M23359 Other meniscus derangements, posterior horn of lateral meniscus, unspecified knee: Secondary | ICD-10-CM | POA: Insufficient documentation

## 2018-01-22 DIAGNOSIS — M2350 Chronic instability of knee, unspecified knee: Secondary | ICD-10-CM | POA: Insufficient documentation

## 2018-01-22 DIAGNOSIS — M6281 Muscle weakness (generalized): Secondary | ICD-10-CM | POA: Insufficient documentation

## 2018-01-22 DIAGNOSIS — M25569 Pain in unspecified knee: Secondary | ICD-10-CM | POA: Insufficient documentation

## 2018-01-22 DIAGNOSIS — M1712 Unilateral primary osteoarthritis, left knee: Secondary | ICD-10-CM | POA: Insufficient documentation

## 2018-01-22 DIAGNOSIS — M2020 Hallux rigidus, unspecified foot: Secondary | ICD-10-CM | POA: Insufficient documentation

## 2018-01-22 DIAGNOSIS — G5620 Lesion of ulnar nerve, unspecified upper limb: Secondary | ICD-10-CM | POA: Insufficient documentation

## 2018-10-30 DIAGNOSIS — M6530 Trigger finger, unspecified finger: Secondary | ICD-10-CM | POA: Insufficient documentation

## 2019-03-03 DIAGNOSIS — M79641 Pain in right hand: Secondary | ICD-10-CM | POA: Insufficient documentation

## 2019-05-27 ENCOUNTER — Encounter: Payer: Self-pay | Admitting: Podiatry

## 2019-05-27 ENCOUNTER — Ambulatory Visit: Payer: BC Managed Care – PPO | Admitting: Podiatry

## 2019-05-27 ENCOUNTER — Other Ambulatory Visit: Payer: Self-pay

## 2019-05-27 DIAGNOSIS — B070 Plantar wart: Secondary | ICD-10-CM | POA: Diagnosis not present

## 2019-05-27 MED ORDER — NONFORMULARY OR COMPOUNDED ITEM: 2 refills | Status: DC

## 2019-05-27 MED ORDER — CIMETIDINE 800 MG PO TABS: 800.0000 mg | ORAL_TABLET | Freq: Every day | ORAL | 1 refills | Status: DC

## 2019-05-27 NOTE — Progress Notes (Signed)
  Subjective:  Patient ID: Todd Madden, male    DOB: October 31, 1960,  MRN: [id number] HPI Chief Complaint  Patient presents with  . Plantar Warts    patient presents today for plantar warts bottom of bilat feet, right worse than left x 1 year.  He state " they are uncomfortable when I walk and I have used everything you can think of to get rid of them"    58 y.o. male presents with the above complaint.   ROS: Denies fever chills nausea vomiting muscle aches pains calf pain back pain chest pain shortness of breath.  No past medical history on file.   Current Outpatient Medications:  .  allopurinol (ZYLOPRIM) 100 MG tablet, TAKE ONE TABLET BY MOUTH EVERY DAY, Disp: , Rfl:  .  Aspirin Buf,CaCarb-MgCarb-MgO, 81 MG TABS, Take by mouth., Disp: , Rfl:  .  colchicine 0.6 MG tablet, TAKE 2 TABS @ ONSET OF FLARE FOLLOWED BY 1 TAB 1 HOUR LATER [place] OF 3 TABS IN 1 attack.  May repeat in 3 days, Disp: , Rfl:  .  fexofenadine (ALLEGRA) 180 MG tablet, Take by mouth., Disp: , Rfl:  .  hydrochlorothiazide (HYDRODIURIL) 12.5 MG tablet, Take by mouth., Disp: , Rfl:  .  verapamil (CALAN-SR) 240 MG CR tablet, Take by mouth., Disp: , Rfl:  .  atorvastatin (LIPITOR) 80 MG tablet, , Disp: , Rfl:  .  cimetidine (TAGAMET) 800 MG tablet, Take 1 tablet (800 mg total) by mouth at bedtime., Disp: 30 tablet, Rfl: 1 .  lisinopril (ZESTRIL) 20 MG tablet, , Disp: , Rfl:  .  NONFORMULARY OR COMPOUNDED ITEM, See pharmacy note, Disp: 120 each, Rfl: 2 .  triamcinolone cream (KENALOG) 0.1 %, , Disp: , Rfl:   No Known Allergies Review of Systems Objective:  There were no vitals filed for this visit.  General: Well developed, nourished, in no acute distress, alert and oriented x3   Dermatological: Skin is warm, dry and supple bilateral. Nails x 10 are well maintained; remaining integument appears unremarkable at this time. There are no open sores, no preulcerative lesions, no rash or signs of infection present.  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  Vascular: Dorsalis Pedis artery and Posterior Tibial artery pedal pulses are 2/4 bilateral with immedate capillary fill time. Pedal hair growth present. No varicosities and no lower extremity edema present bilateral.   Neruologic: Grossly intact via light touch bilateral. Vibratory intact via tuning fork bilateral. Protective threshold with Semmes Wienstein monofilament intact to all pedal sites bilateral. Patellar and Achilles deep tendon reflexes 2+ bilateral. No Babinski or clonus noted bilateral.   Musculoskeletal: No gross boney pedal deformities bilateral. No pain, crepitus, or limitation noted with foot and ankle range of motion bilateral. Muscular strength 5/5 in all groups tested bilateral.  Gait: Unassisted, Nonantalgic.    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  Plan: Discussed etiology pathology conservative surgical therapies.  Due to the amount of skin involvement surgery is not an option for this.  We started him on cimetidine 800 mg once a day as well as a topical anti-wart regimen.  This is a compounded agent.     Vincentina Sollers T. [place], [place]

## 2019-06-03 DIAGNOSIS — G4733 Obstructive sleep apnea (adult) (pediatric): Secondary | ICD-10-CM | POA: Insufficient documentation

## 2019-07-13 ENCOUNTER — Other Ambulatory Visit: Payer: Self-pay

## 2019-07-13 ENCOUNTER — Ambulatory Visit: Payer: BC Managed Care – PPO | Admitting: Podiatry

## 2019-07-13 ENCOUNTER — Encounter: Payer: Self-pay | Admitting: Podiatry

## 2019-07-13 DIAGNOSIS — Z20822 Contact with and (suspected) exposure to covid-19: Secondary | ICD-10-CM

## 2019-07-13 DIAGNOSIS — B070 Plantar wart: Secondary | ICD-10-CM | POA: Diagnosis not present

## 2019-07-13 NOTE — Progress Notes (Signed)
He presents today for follow-up of his plantar warts he states that he is not sure that they are doing much better but his wife seems to think there looking better.  He states they continue the Efudex cream and the cimetidine tablets.  Objective: Vital signs are stable alert and oriented x3.  Pulses are palpable.  Neurologic sensorium is intact.  Deep tendon reflexes are intact.  Muscle strength is normal symmetrical.  Lesions appear to be much smaller and much more superficial than they have been in the past there is a lot of thrombosed capillaries that are now visible which they were not previously.  Assessment: Slowly resolving warts bilateral foot.  Plan: At this point I encouraged him to continue his Efudex cream and his cimetidine I will follow-up with him in 6 weeks to 2 months.

## 2019-07-14 LAB — NOVEL CORONAVIRUS, NAA: SARS-CoV-2, NAA: NOT DETECTED

## 2019-08-04 ENCOUNTER — Encounter: Payer: Self-pay | Admitting: Podiatry

## 2019-08-11 HISTORY — PX: OTHER SURGICAL HISTORY: SHX169

## 2019-09-07 ENCOUNTER — Ambulatory Visit: Payer: BC Managed Care – PPO | Admitting: Podiatry

## 2020-04-18 ENCOUNTER — Ambulatory Visit: Payer: BC Managed Care – PPO | Admitting: Dermatology

## 2020-04-18 ENCOUNTER — Other Ambulatory Visit: Payer: Self-pay

## 2020-04-18 ENCOUNTER — Encounter: Payer: Self-pay | Admitting: Dermatology

## 2020-04-18 DIAGNOSIS — L738 Other specified follicular disorders: Secondary | ICD-10-CM

## 2020-04-18 DIAGNOSIS — C44612 Basal cell carcinoma of skin of right upper limb, including shoulder: Secondary | ICD-10-CM | POA: Diagnosis not present

## 2020-04-18 DIAGNOSIS — L570 Actinic keratosis: Secondary | ICD-10-CM | POA: Diagnosis not present

## 2020-04-18 DIAGNOSIS — L814 Other melanin hyperpigmentation: Secondary | ICD-10-CM

## 2020-04-18 DIAGNOSIS — D485 Neoplasm of uncertain behavior of skin: Secondary | ICD-10-CM

## 2020-04-18 DIAGNOSIS — L820 Inflamed seborrheic keratosis: Secondary | ICD-10-CM | POA: Diagnosis not present

## 2020-04-18 DIAGNOSIS — L821 Other seborrheic keratosis: Secondary | ICD-10-CM

## 2020-04-18 DIAGNOSIS — C4491 Basal cell carcinoma of skin, unspecified: Secondary | ICD-10-CM

## 2020-04-18 DIAGNOSIS — D229 Melanocytic nevi, unspecified: Secondary | ICD-10-CM | POA: Diagnosis not present

## 2020-04-18 DIAGNOSIS — Z1283 Encounter for screening for malignant neoplasm of skin: Secondary | ICD-10-CM

## 2020-04-18 DIAGNOSIS — L578 Other skin changes due to chronic exposure to nonionizing radiation: Secondary | ICD-10-CM

## 2020-04-18 DIAGNOSIS — D1800 Hemangioma unspecified site: Secondary | ICD-10-CM

## 2020-04-18 DIAGNOSIS — L918 Other hypertrophic disorders of the skin: Secondary | ICD-10-CM

## 2020-04-18 HISTORY — DX: Basal cell carcinoma of skin, unspecified: C44.91

## 2020-04-18 NOTE — Progress Notes (Signed)
New Patient Visit  Subjective  Todd Madden is a 59 y.o. male who presents for the following: Annual Exam (patient has noticed an irregular skin lesion on the L pretibial that he would like checked). The patient presents for Total-Body Skin Exam (TBSE) for skin cancer screening and mole check.  The following portions of the chart were reviewed this encounter and updated as appropriate:  Tobacco  Allergies  Meds  Problems  Med Hx  Surg Hx  Fam Hx     Review of Systems:  No other skin or systemic complaints except as noted in HPI or Assessment and Plan.  Objective  Well appearing patient in no apparent distress; mood and affect are within normal limits.  A full examination was performed including scalp, head, eyes, ears, nose, lips, neck, chest, axillae, abdomen, back, buttocks, bilateral upper extremities, bilateral lower extremities, hands, feet, fingers, toes, fingernails, and toenails. All findings within normal limits unless otherwise noted below.  Objective  Face: Small yellow papules with a central dell.   Objective  face (11): Erythematous thin papules/macules with gritty scale.   Objective  R post shoulder: Scaly pink patches on the R upper arm, R shoulder, and upper back x 6  Objective  L pretibial (2): Erythematous keratotic or waxy stuck-on papule or plaque.   Assessment & Plan  Sebaceous hyperplasia Face  Benign, observe.    AK (actinic keratosis) (11) face  Destruction of lesion - face Complexity: simple   Destruction method: cryotherapy   Informed consent: discussed and consent obtained   Timeout:  patient name, date of birth, surgical site, and procedure verified Lesion destroyed using liquid nitrogen: Yes   Region frozen until ice ball extended beyond lesion: Yes   Outcome: patient tolerated procedure well with no complications   Post-procedure details: wound care instructions given    Neoplasm of uncertain behavior of skin R post  shoulder  Skin / nail biopsy Type of biopsy: tangential   Informed consent: discussed and consent obtained   Timeout: patient name, date of birth, surgical site, and procedure verified   Procedure prep:  Patient was prepped and draped in usual sterile fashion Prep type:  Isopropyl alcohol Anesthesia: the lesion was anesthetized in a standard fashion   Anesthetic:  1% lidocaine w/ epinephrine 1-100,000 buffered w/ 8.4% NaHCO3 Instrument used: flexible razor blade   Hemostasis achieved with: pressure, aluminum chloride and electrodesiccation   Outcome: patient tolerated procedure well   Post-procedure details: sterile dressing applied and wound care instructions given   Dressing type: bandage and petrolatum    Specimen 1 - Surgical pathology Differential Diagnosis: D48.5 r/o BCC  Check Margins: No Scaly pink patches on the R post shoulder   R/o superficial BCC - if positive plan to biopsy and treat the other 5 lesions  Inflamed seborrheic keratosis (2) L pretibial  Destruction of lesion - L pretibial Complexity: simple   Destruction method: cryotherapy   Timeout:  patient name, date of birth, surgical site, and procedure verified Lesion destroyed using liquid nitrogen: Yes   Region frozen until ice ball extended beyond lesion: Yes   Outcome: patient tolerated procedure well with no complications   Post-procedure details: wound care instructions given     Lentigines - Scattered tan macules - Discussed due to sun exposure - Benign, observe - Call for any changes  Seborrheic Keratoses - Stuck-on, waxy, tan-brown papules and plaques  - Discussed benign etiology and prognosis. - Observe - Call for any changes  Melanocytic Nevi - Tan-brown and/or pink-flesh-colored symmetric macules and papules - Benign appearing on exam today - Observation - Call clinic for new or changing moles - Recommend daily use of broad spectrum spf 30+ sunscreen to sun-exposed areas.    Hemangiomas - Red papules - Discussed benign nature - Observe - Call for any changes  Actinic Damage - diffuse scaly erythematous macules with underlying dyspigmentation - Recommend daily broad spectrum sunscreen SPF 30+ to sun-exposed areas, reapply every 2 hours as needed.  - Call for new or changing lesions.  Acrochordons (Skin Tags) - Fleshy, skin-colored pedunculated papules - Benign appearing.  - Observe. - If desired, they can be removed with an in office procedure that is not covered by insurance. - Please call the clinic if you notice any new or changing lesions.  Skin cancer screening performed today.  Return in about 2 months (around 06/18/2020) for recheck AK's, ISK, biopsy site and other pink patches.  Luther Redo, CMA, am acting as scribe for Sarina Ser, MD .  Documentation: I have reviewed the above documentation for accuracy and completeness, and I agree with the above.  Sarina Ser, MD

## 2020-04-18 NOTE — Patient Instructions (Signed)

## 2020-04-19 ENCOUNTER — Encounter: Payer: Self-pay | Admitting: Dermatology

## 2020-04-25 ENCOUNTER — Telehealth: Payer: Self-pay

## 2020-04-25 NOTE — Telephone Encounter (Signed)
-----   Message from Ralene Bathe, MD sent at 04/21/2020  7:30 PM EDT ----- Skin , right post shoulder SUPERFICIAL BASAL CELL CARCINOMA  Cancer - BCC Superficial Schedule for treatment [facility]) And recheck other similar spots

## 2020-04-25 NOTE — Telephone Encounter (Signed)
Advised pt of bx results.  Scheduled pt for [facility] on 06/14/20 at 4:30./sh

## 2020-05-17 DIAGNOSIS — J3089 Other allergic rhinitis: Secondary | ICD-10-CM | POA: Insufficient documentation

## 2020-06-14 ENCOUNTER — Encounter: Payer: Self-pay | Admitting: Dermatology

## 2020-06-14 ENCOUNTER — Ambulatory Visit: Payer: BC Managed Care – PPO | Admitting: Dermatology

## 2020-06-14 ENCOUNTER — Other Ambulatory Visit: Payer: Self-pay

## 2020-06-14 DIAGNOSIS — L578 Other skin changes due to chronic exposure to nonionizing radiation: Secondary | ICD-10-CM | POA: Diagnosis not present

## 2020-06-14 DIAGNOSIS — C44612 Basal cell carcinoma of skin of right upper limb, including shoulder: Secondary | ICD-10-CM

## 2020-06-14 DIAGNOSIS — L570 Actinic keratosis: Secondary | ICD-10-CM | POA: Diagnosis not present

## 2020-06-14 DIAGNOSIS — D485 Neoplasm of uncertain behavior of skin: Secondary | ICD-10-CM

## 2020-06-14 NOTE — Progress Notes (Signed)
   Follow-Up Visit   Subjective  Todd Madden is a 59 y.o. male who presents for the following: Basal Cell Carcinoma (right post shoulder - Biopsy proven - EDC today) and Other (Pink patches of right arm and back - Patient's daughter is getting married this weekend and he prefers to wait to have these areas treated.).  The following portions of the chart were reviewed this encounter and updated as appropriate:  Tobacco  Allergies  Meds  Problems  Med Hx  Surg Hx  Fam Hx     Review of Systems:  No other skin or systemic complaints except as noted in HPI or Assessment and Plan.  Objective  Well appearing patient in no apparent distress; mood and affect are within normal limits.  A focused examination was performed including face, scalp, back. Relevant physical exam findings are noted in the Assessment and Plan.  Objective  Right mid back: Pink patch  Objective  Right upper arm: Pink patches x 4  Objective  Right post shoulder: Healing biopsy site  Objective  Scalp: Erythematous thin papules/macules with gritty scale.    Assessment & Plan  Neoplasm of uncertain behavior of skin (2) Right mid back  Right upper arm  Will plan biopsy and EDC on follow up  Basal cell carcinoma (BCC) of skin of right upper extremity including shoulder Right post shoulder  Epidermal / dermal shaving  Lesion diameter (cm):  2.6 Informed consent: discussed and consent obtained   Timeout: patient name, date of birth, surgical site, and procedure verified   Procedure prep:  Patient was prepped and draped in usual sterile fashion Prep type:  Isopropyl alcohol Anesthesia: the lesion was anesthetized in a standard fashion   Anesthetic:  1% lidocaine w/ epinephrine 1-100,000 buffered w/ 8.4% NaHCO3 Instrument used: flexible razor blade   Hemostasis achieved with: pressure, aluminum chloride and electrodesiccation   Outcome: patient tolerated procedure well   Post-procedure details:  sterile dressing applied and wound care instructions given   Dressing type: bandage and petrolatum    Destruction of lesion Complexity: extensive   Destruction method: electrodesiccation and curettage   Informed consent: discussed and consent obtained   Timeout:  patient name, date of birth, surgical site, and procedure verified Procedure prep:  Patient was prepped and draped in usual sterile fashion Prep type:  Isopropyl alcohol Anesthesia: the lesion was anesthetized in a standard fashion   Anesthetic:  1% lidocaine w/ epinephrine 1-100,000 buffered w/ 8.4% NaHCO3 Curettage performed in three different directions: Yes   Electrodesiccation performed over the curetted area: Yes   Lesion length (cm):  2.6 Lesion width (cm):  2.6 Margin per side (cm):  0.2 Final wound size (cm):  3 Hemostasis achieved with:  pressure and aluminum chloride Outcome: patient tolerated procedure well with no complications   Post-procedure details: sterile dressing applied and wound care instructions given   Dressing type: bandage and petrolatum    AK (actinic keratosis) Scalp  Will plan Ln2 on follow up  Actinic Damage - diffuse scaly erythematous macules with underlying dyspigmentation - Recommend daily broad spectrum sunscreen SPF 30+ to sun-exposed areas, reapply every 2 hours as needed.  - Call for new or changing lesions.  Return for Biopsy and EDC in 2-8 weeks.   I, Ashok Cordia, CMA, am acting as scribe for Sarina Ser, MD .  Documentation: I have reviewed the above documentation for accuracy and completeness, and I agree with the above.  Sarina Ser, MD

## 2020-06-14 NOTE — Patient Instructions (Signed)

## 2020-07-05 ENCOUNTER — Ambulatory Visit: Payer: BC Managed Care – PPO | Admitting: Dermatology

## 2020-08-09 ENCOUNTER — Ambulatory Visit: Payer: BC Managed Care – PPO | Admitting: Dermatology

## 2020-08-16 ENCOUNTER — Ambulatory Visit: Payer: BC Managed Care – PPO | Admitting: Dermatology

## 2020-08-16 ENCOUNTER — Encounter: Payer: Self-pay | Admitting: Dermatology

## 2020-08-16 ENCOUNTER — Other Ambulatory Visit: Payer: Self-pay

## 2020-08-16 DIAGNOSIS — L570 Actinic keratosis: Secondary | ICD-10-CM | POA: Diagnosis not present

## 2020-08-16 DIAGNOSIS — D485 Neoplasm of uncertain behavior of skin: Secondary | ICD-10-CM | POA: Diagnosis not present

## 2020-08-16 DIAGNOSIS — Z85828 Personal history of other malignant neoplasm of skin: Secondary | ICD-10-CM | POA: Diagnosis not present

## 2020-08-16 DIAGNOSIS — L578 Other skin changes due to chronic exposure to nonionizing radiation: Secondary | ICD-10-CM

## 2020-08-16 DIAGNOSIS — C44519 Basal cell carcinoma of skin of other part of trunk: Secondary | ICD-10-CM | POA: Diagnosis not present

## 2020-08-16 NOTE — Progress Notes (Signed)
Follow-Up Visit   Subjective  Todd Madden is a 59 y.o. male who presents for the following: Irregular skin lesions (of the right upper arm and right mid back - patient is here today for biopsy and ED&C ) and AK's (on the scalp - patient is here today for LN2).  The following portions of the chart were reviewed this encounter and updated as appropriate:  Tobacco  Allergies  Meds  Problems  Med Hx  Surg Hx  Fam Hx     Review of Systems:  No other skin or systemic complaints except as noted in HPI or Assessment and Plan.  Objective  Well appearing patient in no apparent distress; mood and affect are within normal limits.  A focused examination was performed including the scalp, trunk, and extremities. Relevant physical exam findings are noted in the Assessment and Plan.  Objective  R distal tricep lat above the elbow: Crusted pink patch   Objective  R mid back: 1.1 cm pink patch   Objective  Scalp (16): Erythematous thin papules/macules with gritty scale.   Assessment & Plan  Neoplasm of uncertain behavior of skin (2) R distal tricep lat above the elbow  R mid back  Epidermal / dermal shaving  Lesion diameter (cm):  1.1 Informed consent: discussed and consent obtained   Timeout: patient name, date of birth, surgical site, and procedure verified   Procedure prep:  Patient was prepped and draped in usual sterile fashion Prep type:  Isopropyl alcohol Anesthesia: the lesion was anesthetized in a standard fashion   Anesthetic:  1% lidocaine w/ epinephrine 1-100,000 buffered w/ 8.4% NaHCO3 Instrument used: flexible razor blade   Hemostasis achieved with: pressure, aluminum chloride and electrodesiccation   Outcome: patient tolerated procedure well   Post-procedure details: sterile dressing applied and wound care instructions given   Dressing type: bandage and petrolatum    Destruction of lesion Complexity: extensive   Destruction method: electrodesiccation and  curettage   Informed consent: discussed and consent obtained   Timeout:  patient name, date of birth, surgical site, and procedure verified Procedure prep:  Patient was prepped and draped in usual sterile fashion Prep type:  Isopropyl alcohol Anesthesia: the lesion was anesthetized in a standard fashion   Anesthetic:  1% lidocaine w/ epinephrine 1-100,000 buffered w/ 8.4% NaHCO3 Curettage performed in three different directions: Yes   Electrodesiccation performed over the curetted area: Yes   Lesion length (cm):  1.1 Lesion width (cm):  1.1 Margin per side (cm):  0.2 Final wound size (cm):  1.5 Hemostasis achieved with:  pressure, aluminum chloride and electrodesiccation Outcome: patient tolerated procedure well with no complications   Post-procedure details: sterile dressing applied and wound care instructions given   Dressing type: bandage and petrolatum    Specimen 1 - Surgical pathology Differential Diagnosis: D48.5 r/o BCC Check Margins: No 1.1 cm pink patch  Recheck the R distal tricep lat above the elbow at follow up appointment. If still present plan to biopsy.  AK (actinic keratosis) (16) Scalp  Destruction of lesion - Scalp Complexity: simple   Destruction method: cryotherapy   Informed consent: discussed and consent obtained   Timeout:  patient name, date of birth, surgical site, and procedure verified Lesion destroyed using liquid nitrogen: Yes   Region frozen until ice ball extended beyond lesion: Yes   Outcome: patient tolerated procedure well with no complications   Post-procedure details: wound care instructions given     Actinic Damage - chronic, secondary to  cumulative UV radiation exposure/sun exposure over time - diffuse scaly erythematous macules with underlying dyspigmentation - Recommend daily broad spectrum sunscreen SPF 30+ to sun-exposed areas, reapply every 2 hours as needed.  - Call for new or changing lesions. - Discussed field tx - may  recommend PDT in the future.   History of Basal Cell Carcinoma of the Skin - R post shoulder  - No evidence of recurrence today - Recommend regular full body skin exams - Recommend daily broad spectrum sunscreen SPF 30+ to sun-exposed areas, reapply every 2 hours as needed.  - Call if any new or changing lesions are noted between office visits  Return for F/U appt. in 6-8 weeks.  Luther Redo, CMA, am acting as scribe for Sarina Ser, MD .  Documentation: I have reviewed the above documentation for accuracy and completeness, and I agree with the above.  Sarina Ser, MD

## 2020-08-16 NOTE — Patient Instructions (Signed)

## 2020-08-18 ENCOUNTER — Encounter: Payer: Self-pay | Admitting: Dermatology

## 2020-08-22 ENCOUNTER — Telehealth: Payer: Self-pay

## 2020-08-22 NOTE — Telephone Encounter (Signed)
Patient informed of pathology results 

## 2020-08-22 NOTE — Telephone Encounter (Signed)
-----   Message from Ralene Bathe, MD sent at 08/18/2020  3:27 PM EST ----- Diagnosis Skin , right mid back BASAL CELL CARCINOMA, NODULAR PATTERN  Cancer - BCC Already treated Recheck next visit

## 2020-10-05 DIAGNOSIS — M19041 Primary osteoarthritis, right hand: Secondary | ICD-10-CM | POA: Insufficient documentation

## 2020-10-05 DIAGNOSIS — M19042 Primary osteoarthritis, left hand: Secondary | ICD-10-CM | POA: Insufficient documentation

## 2020-10-12 ENCOUNTER — Other Ambulatory Visit: Payer: Self-pay

## 2020-10-12 ENCOUNTER — Ambulatory Visit: Payer: BC Managed Care – PPO | Admitting: Dermatology

## 2020-10-12 DIAGNOSIS — C44612 Basal cell carcinoma of skin of right upper limb, including shoulder: Secondary | ICD-10-CM

## 2020-10-12 DIAGNOSIS — L578 Other skin changes due to chronic exposure to nonionizing radiation: Secondary | ICD-10-CM

## 2020-10-12 DIAGNOSIS — L820 Inflamed seborrheic keratosis: Secondary | ICD-10-CM

## 2020-10-12 DIAGNOSIS — D485 Neoplasm of uncertain behavior of skin: Secondary | ICD-10-CM

## 2020-10-12 DIAGNOSIS — Z85828 Personal history of other malignant neoplasm of skin: Secondary | ICD-10-CM

## 2020-10-12 DIAGNOSIS — L570 Actinic keratosis: Secondary | ICD-10-CM

## 2020-10-12 NOTE — Progress Notes (Unsigned)
Follow-Up Visit   Subjective  Todd Madden is a 60 y.o. male who presents for the following: Basal Cell Carcinoma (Follow up right mid back - Biopsy and [facility] 08/16/2020) and Follow-up (AK follow up of scalp treated with LN2). He has other spots to be evaluated today.  The following portions of the chart were reviewed this encounter and updated as appropriate:   Tobacco  Allergies  Meds  Problems  Med Hx  Surg Hx  Fam Hx     Review of Systems:  No other skin or systemic complaints except as noted in HPI or Assessment and Plan.  Objective  Well appearing patient in no apparent distress; mood and affect are within normal limits.  examination was performed including scalp, head, eyes, ears, nose, lips, neck, chest, axillae, abdomen, back, bilateral upper extremities, hands, fingers,  fingernails. All findings within normal limits unless otherwise noted below.  Objective  Right distal lat tricep above elbow: 1.1 cm scaly pink patch  Objective  Right distal lat tricep above elbow medial: 0.7 cm scaly pink patch  Objective  Left Lower Eyelid: Erythematous keratotic or waxy stuck-on papule or plaque.    Assessment & Plan    Actinic Damage - chronic, secondary to cumulative UV radiation exposure/sun exposure over time - diffuse scaly erythematous macules with underlying dyspigmentation - Recommend daily broad spectrum sunscreen SPF 30+ to sun-exposed areas, reapply every 2 hours as needed.  - Call for new or changing lesions.  History of Basal Cell Carcinoma of the Skin - No evidence of recurrence today - Recommend regular full body skin exams - Recommend daily broad spectrum sunscreen SPF 30+ to sun-exposed areas, reapply every 2 hours as needed.  - Call if any new or changing lesions are noted between office visits   Neoplasm of uncertain behavior of skin (2) Right distal lat tricep above elbow  Epidermal / dermal shaving  Lesion diameter (cm):  1.1 Informed  consent: discussed and consent obtained   Timeout: patient name, date of birth, surgical site, and procedure verified   Procedure prep:  Patient was prepped and draped in usual sterile fashion Prep type:  Isopropyl alcohol Anesthesia: the lesion was anesthetized in a standard fashion   Anesthetic:  1% lidocaine w/ epinephrine 1-100,000 buffered w/ 8.4% NaHCO3 Instrument used: flexible razor blade   Hemostasis achieved with: pressure, aluminum chloride and electrodesiccation   Outcome: patient tolerated procedure well   Post-procedure details: sterile dressing applied and wound care instructions given   Dressing type: bandage and petrolatum    Destruction of lesion Complexity: extensive   Destruction method: electrodesiccation and curettage   Informed consent: discussed and consent obtained   Timeout:  patient name, date of birth, surgical site, and procedure verified Procedure prep:  Patient was prepped and draped in usual sterile fashion Prep type:  Isopropyl alcohol Anesthesia: the lesion was anesthetized in a standard fashion   Anesthetic:  1% lidocaine w/ epinephrine 1-100,000 buffered w/ 8.4% NaHCO3 Curettage performed in three different directions: Yes   Electrodesiccation performed over the curetted area: Yes   Lesion length (cm):  1.1 Lesion width (cm):  1.1 Margin per side (cm):  0.2 Final wound size (cm):  1.5 Hemostasis achieved with:  pressure and aluminum chloride Outcome: patient tolerated procedure well with no complications   Post-procedure details: sterile dressing applied and wound care instructions given   Dressing type: bandage and petrolatum    Specimen 1 - Surgical pathology Differential Diagnosis: BCC vs other  Check Margins: No 1.1 cm scaly pink patch EDC today  Right distal lat tricep above elbow medial  Epidermal / dermal shaving  Lesion diameter (cm):  0.7 Informed consent: discussed and consent obtained   Timeout: patient name, date of birth,  surgical site, and procedure verified   Procedure prep:  Patient was prepped and draped in usual sterile fashion Prep type:  Isopropyl alcohol Anesthesia: the lesion was anesthetized in a standard fashion   Anesthetic:  1% lidocaine w/ epinephrine 1-100,000 buffered w/ 8.4% NaHCO3 Instrument used: flexible razor blade   Hemostasis achieved with: pressure, aluminum chloride and electrodesiccation   Outcome: patient tolerated procedure well   Post-procedure details: sterile dressing applied and wound care instructions given   Dressing type: bandage and petrolatum    Destruction of lesion Complexity: extensive   Destruction method: electrodesiccation and curettage   Informed consent: discussed and consent obtained   Timeout:  patient name, date of birth, surgical site, and procedure verified Procedure prep:  Patient was prepped and draped in usual sterile fashion Prep type:  Isopropyl alcohol Anesthesia: the lesion was anesthetized in a standard fashion   Anesthetic:  1% lidocaine w/ epinephrine 1-100,000 buffered w/ 8.4% NaHCO3 Curettage performed in three different directions: Yes   Electrodesiccation performed over the curetted area: Yes   Lesion length (cm):  0.7 Lesion width (cm):  0.7 Margin per side (cm):  0.2 Final wound size (cm):  1.1 Hemostasis achieved with:  pressure and aluminum chloride Outcome: patient tolerated procedure well with no complications   Post-procedure details: sterile dressing applied and wound care instructions given   Dressing type: bandage and petrolatum    Specimen 2 - Surgical pathology Differential Diagnosis: BCC vs other  Check Margins: No 0.7 cm scaly pink patch EDC today  AK (actinic keratosis) Left Ear  Destruction of lesion - Left Ear Complexity: simple   Destruction method: cryotherapy   Informed consent: discussed and consent obtained   Timeout:  patient name, date of birth, surgical site, and procedure verified Lesion destroyed  using liquid nitrogen: Yes   Region frozen until ice ball extended beyond lesion: Yes   Outcome: patient tolerated procedure well with no complications   Post-procedure details: wound care instructions given    Inflamed seborrheic keratosis Left Lower Eyelid  Will plan LN2 on follow up.  Basal cell carcinoma (BCC) of right upper arm  Return in about 2 months (around 12/10/2020) for AK follow up, Biopsy follow up, treat ISK.   I, Ashok Cordia, CMA, am acting as scribe for Sarina Ser, MD .  Documentation: I have reviewed the above documentation for accuracy and completeness, and I agree with the above.  Sarina Ser, MD

## 2020-10-12 NOTE — Patient Instructions (Signed)

## 2020-10-14 ENCOUNTER — Encounter: Payer: Self-pay | Admitting: Dermatology

## 2020-10-17 ENCOUNTER — Telehealth: Payer: Self-pay

## 2020-10-17 NOTE — Telephone Encounter (Signed)
-----   Message from Ralene Bathe, MD sent at 10/13/2020  5:59 PM EST ----- Diagnosis 1. Skin , right distal lat tricep above elebow BASAL CELL CARCINOMA, SUPERFICIAL AND NODULAR PATTERNS 2. Skin , right disdtal lat tricep above elbow medial BASAL CELL CARCINOMA, SUPERFICIAL AND NODULAR PATTERNS  1&2 - both cancer - BCC Both already treated Recheck next visit

## 2020-10-17 NOTE — Telephone Encounter (Signed)
Advised patient of results/hd  

## 2020-12-05 ENCOUNTER — Other Ambulatory Visit: Payer: Self-pay

## 2020-12-05 ENCOUNTER — Ambulatory Visit (INDEPENDENT_AMBULATORY_CARE_PROVIDER_SITE_OTHER): Payer: BC Managed Care – PPO | Admitting: Dermatology

## 2020-12-05 DIAGNOSIS — C44612 Basal cell carcinoma of skin of right upper limb, including shoulder: Secondary | ICD-10-CM | POA: Diagnosis not present

## 2020-12-05 DIAGNOSIS — Z85828 Personal history of other malignant neoplasm of skin: Secondary | ICD-10-CM

## 2020-12-05 DIAGNOSIS — D489 Neoplasm of uncertain behavior, unspecified: Secondary | ICD-10-CM

## 2020-12-05 DIAGNOSIS — L570 Actinic keratosis: Secondary | ICD-10-CM | POA: Diagnosis not present

## 2020-12-05 DIAGNOSIS — L820 Inflamed seborrheic keratosis: Secondary | ICD-10-CM

## 2020-12-05 NOTE — Progress Notes (Signed)
Follow-Up Visit   Subjective  Todd Madden is a 60 y.o. male who presents for the following: 2 month follow up (Patient here today for follow up for isk on left lower eyelid and ak follow up on left ear. He is also here today for follow up on 2 biopsies on right distal lateral tricep above elbow.  He states areas are healing and has no concerns. ). The spot on his right deltoid area and rough areas on his ear and scalp The following portions of the chart were reviewed this encounter and updated as appropriate:  Tobacco  Allergies  Meds  Problems  Med Hx  Surg Hx  Fam Hx      Objective  Well appearing patient in no apparent distress; mood and affect are within normal limits.  A focused examination was performed including face, scalp, left ear, left eye, right shoulder/arm . Relevant physical exam findings are noted in the Assessment and Plan.  Objective  Left Ear x 3, scalp x 1 (4): Erythematous thin papules/macules with gritty scale.   Objective  left hand x 1, scalp x 2 (3): Erythematous keratotic or waxy stuck-on papule or plaque.   Objective  Right anterior lateral deltoid: 2.1 cm crusted pink patch      Assessment & Plan   History of Basal Cell Carcinoma of the Skin - No evidence of recurrence today right distal lateral tricep above elbow and right distal tricep above elbow medial (ED&C done )  - Recommend regular full body skin exams - Recommend daily broad spectrum sunscreen SPF 30+ to sun-exposed areas, reapply every 2 hours as needed.  - Call if any new or changing lesions are noted between office visits  History of Basal Cell Carcinoma of the Skin - No evidence of recurrence today right posterior shoulder  - Recommend regular full body skin exams - Recommend daily broad spectrum sunscreen SPF 30+ to sun-exposed areas, reapply every 2 hours as needed.  - Call if any new or changing lesions are noted between office visits  Actinic keratosis (4) Left Ear x  3, scalp x 1 Prior to procedure, discussed risks of blister formation, small wound, skin dyspigmentation, or rare scar following cryotherapy.   Destruction of lesion - Left Ear x 3, scalp x 1 Complexity: simple   Destruction method: cryotherapy   Informed consent: discussed and consent obtained   Timeout:  patient name, date of birth, surgical site, and procedure verified Lesion destroyed using liquid nitrogen: Yes   Region frozen until ice ball extended beyond lesion: Yes   Outcome: patient tolerated procedure well with no complications   Post-procedure details: wound care instructions given    Inflamed seborrheic keratosis (3) left hand x 1, scalp x 2 Will wait to treat until after eye surgery to treat under left eyelid Prior to procedure, discussed risks of blister formation, small wound, skin dyspigmentation, or rare scar following cryotherapy.   Destruction of lesion - left hand x 1, scalp x 2 Complexity: simple   Destruction method: cryotherapy   Informed consent: discussed and consent obtained   Timeout:  patient name, date of birth, surgical site, and procedure verified Lesion destroyed using liquid nitrogen: Yes   Region frozen until ice ball extended beyond lesion: Yes   Outcome: patient tolerated procedure well with no complications   Post-procedure details: wound care instructions given    Neoplasm of uncertain behavior Right anterior lateral deltoid Epidermal / dermal shaving  Lesion diameter (cm):  2.1  Informed consent: discussed and consent obtained   Timeout: patient name, date of birth, surgical site, and procedure verified   Procedure prep:  Patient was prepped and draped in usual sterile fashion Prep type:  Isopropyl alcohol Anesthesia: the lesion was anesthetized in a standard fashion   Anesthetic:  1% lidocaine w/ epinephrine 1-100,000 buffered w/ 8.4% NaHCO3 Instrument used: flexible razor blade   Hemostasis achieved with: pressure, aluminum chloride and  electrodesiccation   Outcome: patient tolerated procedure well   Post-procedure details: sterile dressing applied and wound care instructions given   Dressing type: bandage and petrolatum    Destruction of lesion Complexity: extensive   Destruction method: electrodesiccation and curettage   Informed consent: discussed and consent obtained   Timeout:  patient name, date of birth, surgical site, and procedure verified Procedure prep:  Patient was prepped and draped in usual sterile fashion Prep type:  Isopropyl alcohol Anesthesia: the lesion was anesthetized in a standard fashion   Anesthetic:  1% lidocaine w/ epinephrine 1-100,000 buffered w/ 8.4% NaHCO3 Curettage performed in three different directions: Yes   Electrodesiccation performed over the curetted area: Yes   Lesion length (cm):  2.1 Lesion width (cm):  2.1 Margin per side (cm):  0.2 Final wound size (cm):  2.5 Hemostasis achieved with:  pressure, aluminum chloride and electrodesiccation Outcome: patient tolerated procedure well with no complications   Post-procedure details: sterile dressing applied and wound care instructions given   Dressing type: bandage and petrolatum    R/o bcc  Other Related Procedures Surgical pathology  Return in about 3 months (around 03/07/2021) for ak and isk follow up.  IRuthell Rummage, CMA, am acting as scribe for Sarina Ser, MD.  Documentation: I have reviewed the above documentation for accuracy and completeness, and I agree with the above.  Sarina Ser, MD

## 2020-12-05 NOTE — Patient Instructions (Addendum)
Biopsy Wound Care Instructions  1. Leave the original bandage on for 24 hours if possible.  If the bandage becomes soaked or soiled before that time, it is OK to remove it and examine the wound.  A small amount of post-operative bleeding is normal.  If excessive bleeding occurs, remove the bandage, place gauze over the site and apply continuous pressure (no peeking) over the area for 30 minutes. If this does not work, please call our clinic as soon as possible or page your doctor if it is after hours.   2. Once a day, cleanse the wound with soap and water. It is fine to shower. If a thick crust develops you may use a Q-tip dipped into dilute hydrogen peroxide (mix 1:1 with water) to dissolve it.  Hydrogen peroxide can slow the healing process, so use it only as needed.    3. After washing, apply petroleum jelly (Vaseline) or an antibiotic ointment if your doctor prescribed one for you, followed by a bandage.    4. For best healing, the wound should be covered with a layer of ointment at all times. If you are not able to keep the area covered with a bandage to hold the ointment in place, this may mean re-applying the ointment several times a day.  Continue this wound care until the wound has healed and is no longer open.   Itching and mild discomfort is normal during the healing process. However, if you develop pain or severe itching, please call our office.   If you have any discomfort, you can take Tylenol (acetaminophen) or ibuprofen as directed on the bottle. (Please do not take these if you have an allergy to them or cannot take them for another reason).  Some redness, tenderness and white or yellow material in the wound is normal healing.  If the area becomes very sore and red, or develops a thick yellow-green material (pus), it may be infected; please notify us.    If you have stitches, return to clinic as directed to have the stitches removed. You will continue wound care for 2-3 days after  the stitches are removed.   Wound healing continues for up to one year following surgery. It is not unusual to experience pain in the scar from time to time during the interval.  If the pain becomes severe or the scar thickens, you should notify the office.    A slight amount of redness in a scar is expected for the first six months.  After six months, the redness will fade and the scar will soften and fade.  The color difference becomes less noticeable with time.  If there are any problems, return for a post-op surgery check at your earliest convenience.  To improve the appearance of the scar, you can use silicone scar gel, cream, or sheets (such as Mederma or Serica) every night for up to one year. These are available over the counter (without a prescription).  Please call our office at [phone] for any questions or concerns.        If you have any questions or concerns for your doctor, please call our main line at [phone] and press option 4 to reach your doctor's medical assistant. If no one answers, please leave a voicemail as directed and we will return your call as soon as possible. Messages left after 4 pm will be answered the following business day.   You may also send us a message via MyChart. We typically respond to   MyChart messages within 1-2 business days.  For prescription refills, please ask your pharmacy to contact our office. Our fax number is [phone].  If you have an urgent issue when the clinic is closed that cannot wait until the next business day, you can page your doctor at the number below.    Please note that while we do our best to be available for urgent issues outside of office hours, we are not available 24/7.   If you have an urgent issue and are unable to reach us, you may choose to seek medical care at your doctor's office, retail clinic, urgent care center, or emergency room.  If you have a medical emergency, please immediately call 911 or go  to the emergency department.  Pager Numbers  - Dr. Kowalski: [phone]  - Dr. Moye: [phone]  - Dr. Stewart: [phone]  In the event of inclement weather, please call our main line at [phone] for an update on the status of any delays or closures.  Dermatology Medication Tips: Please keep the boxes that topical medications come in in order to help keep track of the instructions about where and how to use these. Pharmacies typically print the medication instructions only on the boxes and not directly on the medication tubes.   If your medication is too expensive, please contact our office at [phone] option 4 or send us a message through MyChart.   We are unable to tell what your co-pay for medications will be in advance as this is different depending on your insurance coverage. However, we may be able to find a substitute medication at lower cost or fill out paperwork to get insurance to cover a needed medication.   If a prior authorization is required to get your medication covered by your insurance company, please allow us 1-2 business days to complete this process.  Drug prices often vary depending on where the prescription is filled and some pharmacies may offer cheaper prices.  The website www.goodrx.com contains coupons for medications through different pharmacies. The prices here do not account for what the cost may be with help from insurance (it may be cheaper with your insurance), but the website can give you the price if you did not use any insurance.  - You can print the associated coupon and take it with your prescription to the pharmacy.  - You may also stop by our office during regular business hours and pick up a GoodRx coupon card.  - If you need your prescription sent electronically to a different pharmacy, notify our office through [place] [place] MyChart or by phone at [phone] option 4.  

## 2020-12-07 ENCOUNTER — Telehealth: Payer: Self-pay

## 2020-12-07 ENCOUNTER — Encounter: Payer: Self-pay | Admitting: Dermatology

## 2020-12-07 NOTE — Telephone Encounter (Signed)
-----   Message from Ralene Bathe, MD sent at 12/07/2020  1:30 PM EDT ----- Diagnosis Skin , right anterior lateral deltoid BASAL CELL CARCINOMA, NODULAR AND INFILTRATIVE PATTERNS, CLOSE TO MARGIN  Cancer - BCC Already treated  Recheck next visit

## 2020-12-07 NOTE — Telephone Encounter (Signed)
Advised patient of results/hd  

## 2020-12-19 ENCOUNTER — Other Ambulatory Visit: Payer: Self-pay

## 2020-12-19 ENCOUNTER — Encounter (INDEPENDENT_AMBULATORY_CARE_PROVIDER_SITE_OTHER): Payer: BC Managed Care – PPO | Admitting: Ophthalmology

## 2020-12-19 DIAGNOSIS — H35033 Hypertensive retinopathy, bilateral: Secondary | ICD-10-CM | POA: Diagnosis not present

## 2020-12-19 DIAGNOSIS — H43813 Vitreous degeneration, bilateral: Secondary | ICD-10-CM | POA: Diagnosis not present

## 2020-12-19 DIAGNOSIS — I10 Essential (primary) hypertension: Secondary | ICD-10-CM | POA: Diagnosis not present

## 2020-12-19 DIAGNOSIS — H353122 Nonexudative age-related macular degeneration, left eye, intermediate dry stage: Secondary | ICD-10-CM | POA: Diagnosis not present

## 2021-01-10 ENCOUNTER — Encounter: Payer: Self-pay | Admitting: Dermatology

## 2021-01-10 ENCOUNTER — Other Ambulatory Visit: Payer: Self-pay

## 2021-01-10 ENCOUNTER — Ambulatory Visit: Payer: BC Managed Care – PPO | Admitting: Dermatology

## 2021-01-10 DIAGNOSIS — L578 Other skin changes due to chronic exposure to nonionizing radiation: Secondary | ICD-10-CM | POA: Diagnosis not present

## 2021-01-10 DIAGNOSIS — L820 Inflamed seborrheic keratosis: Secondary | ICD-10-CM | POA: Diagnosis not present

## 2021-01-10 DIAGNOSIS — Z85828 Personal history of other malignant neoplasm of skin: Secondary | ICD-10-CM

## 2021-01-10 NOTE — Progress Notes (Signed)
   Follow-Up Visit   Subjective  Todd Madden is a 60 y.o. male who presents for the following: ISk to treat (L lower eyelid) and recheck BCC (R ant lat deltoid, bx and EDC 12/05/20).  The following portions of the chart were reviewed this encounter and updated as appropriate:   Tobacco  Allergies  Meds  Problems  Med Hx  Surg Hx  Fam Hx     Review of Systems:  No other skin or systemic complaints except as noted in HPI or Assessment and Plan.  Objective  Well appearing patient in no apparent distress; mood and affect are within normal limits.  A focused examination was performed including face. Relevant physical exam findings are noted in the Assessment and Plan.  Objective  L lower eyelid margin x 1: Erythematous keratotic or waxy stuck-on papule or plaque.   Objective  R ant lat deltoid: Well healed scar with no evidence of recurrence.    Assessment & Plan  Inflamed seborrheic keratosis L lower eyelid margin x 1  Destruction of lesion - L lower eyelid margin x 1 Complexity: simple   Destruction method: cryotherapy   Informed consent: discussed and consent obtained   Timeout:  patient name, date of birth, surgical site, and procedure verified Lesion destroyed using liquid nitrogen: Yes   Region frozen until ice ball extended beyond lesion: Yes   Outcome: patient tolerated procedure well with no complications   Post-procedure details: wound care instructions given    History of basal cell carcinoma (BCC) R ant lat deltoid  Clear. Observe for recurrence. Call clinic for new or changing lesions.  Recommend regular skin exams, daily broad-spectrum spf 30+ sunscreen use, and photoprotection.     Actinic Damage - chronic, secondary to cumulative UV radiation exposure/sun exposure over time - diffuse scaly erythematous macules with underlying dyspigmentation - Recommend daily broad spectrum sunscreen SPF 30+ to sun-exposed areas, reapply every 2 hours as needed.  -  Recommend staying in the shade or wearing long sleeves, sun glasses (UVA+UVB protection) and wide brim hats (4-inch brim around the entire circumference of the hat). - Call for new or changing lesions.  Return in about 3 months (around 04/11/2021) for AK f/u, recheck ISK L lower eyelid margin, hx of BCC.  I, Othelia Pulling, RMA, am acting as scribe for Sarina Ser, MD .  Documentation: I have reviewed the above documentation for accuracy and completeness, and I agree with the above.  Sarina Ser, MD

## 2021-01-10 NOTE — Patient Instructions (Signed)

## 2021-01-12 ENCOUNTER — Encounter: Payer: Self-pay | Admitting: Dermatology

## 2021-03-08 ENCOUNTER — Ambulatory Visit: Payer: BC Managed Care – PPO | Admitting: Dermatology

## 2021-04-10 ENCOUNTER — Other Ambulatory Visit: Payer: Self-pay

## 2021-04-10 ENCOUNTER — Encounter: Payer: Self-pay | Admitting: Dermatology

## 2021-04-10 ENCOUNTER — Ambulatory Visit: Payer: BC Managed Care – PPO | Admitting: Dermatology

## 2021-04-10 DIAGNOSIS — L578 Other skin changes due to chronic exposure to nonionizing radiation: Secondary | ICD-10-CM | POA: Diagnosis not present

## 2021-04-10 DIAGNOSIS — L820 Inflamed seborrheic keratosis: Secondary | ICD-10-CM | POA: Diagnosis not present

## 2021-04-10 DIAGNOSIS — L570 Actinic keratosis: Secondary | ICD-10-CM | POA: Diagnosis not present

## 2021-04-10 DIAGNOSIS — L821 Other seborrheic keratosis: Secondary | ICD-10-CM | POA: Diagnosis not present

## 2021-04-10 DIAGNOSIS — B353 Tinea pedis: Secondary | ICD-10-CM

## 2021-04-10 MED ORDER — TERBINAFINE HCL 250 MG PO TABS
250.0000 mg | ORAL_TABLET | Freq: Every day | ORAL | 0 refills | Status: DC
Start: 2021-04-10 — End: 2022-08-29

## 2021-04-10 NOTE — Patient Instructions (Addendum)
Actinic keratoses are precancerous spots that appear secondary to cumulative UV radiation exposure/sun exposure over time. They are chronic with expected duration over 1 year. A portion of actinic keratoses will progress to squamous cell carcinoma of the skin. It is not possible to reliably predict which spots will progress to skin cancer and so treatment is recommended to prevent development of skin cancer.  Recommend daily broad spectrum sunscreen SPF 30+ to sun-exposed areas, reapply every 2 hours as needed.  Recommend staying in the shade or wearing long sleeves, sun glasses (UVA+UVB protection) and wide brim hats (4-inch brim around the entire circumference of the hat). Call for new or changing lesions.   Cryotherapy Aftercare  Wash gently with soap and water everyday.   Apply Vaseline and Band-Aid daily until healed.   If you have any questions or concerns for your doctor, please call our main line at [phone] and press option 4 to reach your doctor's medical assistant. If no one answers, please leave a voicemail as directed and we will return your call as soon as possible. Messages left after 4 pm will be answered the following business day.   You may also send us a message via MyChart. We typically respond to MyChart messages within 1-2 business days.  For prescription refills, please ask your pharmacy to contact our office. Our fax number is [phone].  If you have an urgent issue when the clinic is closed that cannot wait until the next business day, you can page your doctor at the number below.    Please note that while we do our best to be available for urgent issues outside of office hours, we are not available 24/7.   If you have an urgent issue and are unable to reach us, you may choose to seek medical care at your doctor's office, retail clinic, urgent care center, or emergency room.  If you have a medical emergency, please immediately call 911 or go to the emergency  department.  Pager Numbers  - Dr. Kowalski: [phone]  - Dr. Moye: [phone]  - Dr. Stewart: [phone]  In the event of inclement weather, please call our main line at [phone] for an update on the status of any delays or closures.  Dermatology Medication Tips: Please keep the boxes that topical medications come in in order to help keep track of the instructions about where and how to use these. Pharmacies typically print the medication instructions only on the boxes and not directly on the medication tubes.   If your medication is too expensive, please contact our office at [phone] option 4 or send us a message through MyChart.   We are unable to tell what your co-pay for medications will be in advance as this is different depending on your insurance coverage. However, we may be able to find a substitute medication at lower cost or fill out paperwork to get insurance to cover a needed medication.   If a prior authorization is required to get your medication covered by your insurance company, please allow us 1-2 business days to complete this process.  Drug prices often vary depending on where the prescription is filled and some pharmacies may offer cheaper prices.  The website www.goodrx.com contains coupons for medications through different pharmacies. The prices here do not account for what the cost may be with help from insurance (it may be cheaper with your insurance), but the website can give you the price if you did not use any insurance.  - You   can print the associated coupon and take it with your prescription to the pharmacy.  - You may also stop by our office during regular business hours and pick up a GoodRx coupon card.  - If you need your prescription sent electronically to a different pharmacy, notify our office through [facility] MyChart or by phone at [phone] option 4.  

## 2021-04-10 NOTE — Progress Notes (Signed)
Follow-Up Visit   Subjective  Todd Madden is a 60 y.o. male who presents for the following: Follow-up (Patient here today for follow up on isk on left lower eyelid. He also states he has a place on chin he would like looked at. He reports feet have been bothering him and would also liked looked at. ).  The following portions of the chart were reviewed this encounter and updated as appropriate:  Tobacco  Allergies  Meds  Problems  Med Hx  Surg Hx  Fam Hx     Objective  Well appearing patient in no apparent distress; mood and affect are within normal limits.  A focused examination was performed including face, scalp, chest, back, bilateral feet and ears. Relevant physical exam findings are noted in the Assessment and Plan.  Scalp x 6, right ear x 2, left ear x 1 (9) Erythematous thin papules/macules with gritty scale.   Right Foot - Anterior Scaling of feet   left chin x 1, left lower eyelid margin x 1 (2) Erythematous keratotic or waxy stuck-on papule or plaque.   Assessment & Plan  Actinic keratosis (9) Scalp x 6, right ear x 2, left ear x 1  Actinic keratoses are precancerous spots that appear secondary to cumulative UV radiation exposure/sun exposure over time. They are chronic with expected duration over 1 year. A portion of actinic keratoses will progress to squamous cell carcinoma of the skin. It is not possible to reliably predict which spots will progress to skin cancer and so treatment is recommended to prevent development of skin cancer.  Recommend daily broad spectrum sunscreen SPF 30+ to sun-exposed areas, reapply every 2 hours as needed.  Recommend staying in the shade or wearing long sleeves, sun glasses (UVA+UVB protection) and wide brim hats (4-inch brim around the entire circumference of the hat). Call for new or changing lesions.  Destruction of lesion - Scalp x 6, right ear x 2, left ear x 1 Complexity: simple   Destruction method: cryotherapy    Informed consent: discussed and consent obtained   Timeout:  patient name, date of birth, surgical site, and procedure verified Lesion destroyed using liquid nitrogen: Yes   Region frozen until ice ball extended beyond lesion: Yes   Outcome: patient tolerated procedure well with no complications   Post-procedure details: wound care instructions given    Tinea pedis of both feet Right Foot - Anterior Chronic and persistent Denies liver problems Start Lamisil 250 daily Terbinafine Counseling  Terbinafine is an anti-fungal medicine that can be applied to the skin (over the counter) or taken by mouth (prescription) to treat fungal infections. The pill version is often used to treat fungal infections of the nails or scalp. While most people do not have any side effects from taking terbinafine pills, some possible side effects of the medicine can include taste changes, headache, loss of smell, vision changes, nausea, vomiting, or diarrhea.   Rare side effects can include irritation of the liver, allergic reaction, or decrease in blood counts (which may show up as not feeling well or developing an infection). If you are concerned about any of these side effects, please stop the medicine and call your doctor, or in the case of an emergency such as feeling very unwell, seek immediate medical care.   Start terbinafine 250 mg tablet - take 1 tablet by mouth daily.  30 tabs 0 rf  Plan to recheck in 6 weeks    terbinafine (LAMISIL) 250 MG tablet -  Right Foot - Anterior Take 1 tablet (250 mg total) by mouth daily.  Inflamed seborrheic keratosis left chin x 1, left lower eyelid margin x 1  Prior to procedure, discussed risks of blister formation, small wound, skin dyspigmentation, or rare scar following cryotherapy. Recommend Vaseline ointment to treated areas while healing.  Destruction of lesion - left chin x 1, left lower eyelid margin x 1 Complexity: simple   Destruction method: cryotherapy    Informed consent: discussed and consent obtained   Timeout:  patient name, date of birth, surgical site, and procedure verified Lesion destroyed using liquid nitrogen: Yes   Region frozen until ice ball extended beyond lesion: Yes   Outcome: patient tolerated procedure well with no complications   Post-procedure details: wound care instructions given   Seborrheic Keratoses - Stuck-on, waxy, tan-brown papules and/or plaques  - Benign-appearing - Discussed benign etiology and prognosis. - Observe - Call for any changes  Actinic Damage - chronic, secondary to cumulative UV radiation exposure/sun exposure over time - diffuse scaly erythematous macules with underlying dyspigmentation - Recommend daily broad spectrum sunscreen SPF 30+ to sun-exposed areas, reapply every 2 hours as needed.  - Recommend staying in the shade or wearing long sleeves, sun glasses (UVA+UVB protection) and wide brim hats (4-inch brim around the entire circumference of the hat). - Call for new or changing lesions.  Return in about 6 weeks (around 05/22/2021) for isk and tinea pedis follow up. IRuthell Rummage, CMA, am acting as scribe for Sarina Ser, MD.  Documentation: I have reviewed the above documentation for accuracy and completeness, and I agree with the above.  Sarina Ser, MD

## 2021-05-24 ENCOUNTER — Other Ambulatory Visit: Payer: Self-pay

## 2021-05-24 ENCOUNTER — Ambulatory Visit: Payer: BC Managed Care – PPO | Admitting: Dermatology

## 2021-05-24 DIAGNOSIS — L578 Other skin changes due to chronic exposure to nonionizing radiation: Secondary | ICD-10-CM

## 2021-05-24 DIAGNOSIS — Z872 Personal history of diseases of the skin and subcutaneous tissue: Secondary | ICD-10-CM

## 2021-05-24 DIAGNOSIS — L820 Inflamed seborrheic keratosis: Secondary | ICD-10-CM | POA: Diagnosis not present

## 2021-05-24 DIAGNOSIS — B353 Tinea pedis: Secondary | ICD-10-CM

## 2021-05-24 NOTE — Progress Notes (Signed)
   Follow-Up Visit   Subjective  Todd Madden is a 60 y.o. male who presents for the following: Actinic Keratosis (The face and ears previously tx with LN2 - check for persistence today). Patient has a scaly irritated lesion on the R pretibial that he would like checked today. Recheck the feet for tinea pedis S/P Terbinafine - resolved per patient.   The following portions of the chart were reviewed this encounter and updated as appropriate:   Tobacco  Allergies  Meds  Problems  Med Hx  Surg Hx  Fam Hx     Review of Systems:  No other skin or systemic complaints except as noted in HPI or Assessment and Plan.  Objective  Well appearing patient in no apparent distress; mood and affect are within normal limits.  A focused examination was performed including the face, ears, scalp, and feet. Relevant physical exam findings are noted in the Assessment and Plan.  R pretibial x 1 Erythematous keratotic or waxy stuck-on papule or plaque.   Face and ears Clear.   B/L foot Clear.   Assessment & Plan  Inflamed seborrheic keratosis R pretibial x 1  Destruction of lesion - R pretibial x 1 Complexity: simple   Destruction method: cryotherapy   Informed consent: discussed and consent obtained   Timeout:  patient name, date of birth, surgical site, and procedure verified Lesion destroyed using liquid nitrogen: Yes   Region frozen until ice ball extended beyond lesion: Yes   Outcome: patient tolerated procedure well with no complications   Post-procedure details: wound care instructions given    History of actinic keratosis Face and ears  Clear. Observe for recurrence. Call clinic for new or changing lesions.  Recommend regular skin exams, daily broad-spectrum spf 30+ sunscreen use, and photoprotection.     Tinea pedis of both feet B/L foot  History of tinea pedis - resolved with oral Terbinafine  Related Medications terbinafine (LAMISIL) 250 MG tablet Take 1 tablet (250 mg  total) by mouth daily.  Actinic Damage - chronic, secondary to cumulative UV radiation exposure/sun exposure over time - diffuse scaly erythematous macules with underlying dyspigmentation - Recommend daily broad spectrum sunscreen SPF 30+ to sun-exposed areas, reapply every 2 hours as needed.  - Recommend staying in the shade or wearing long sleeves, sun glasses (UVA+UVB protection) and wide brim hats (4-inch brim around the entire circumference of the hat). - Call for new or changing lesions.  Return in about 4 months (around 09/23/2021) for Ak follow up .  Luther Redo, CMA, am acting as scribe for Sarina Ser, MD . Documentation: I have reviewed the above documentation for accuracy and completeness, and I agree with the above.  Sarina Ser, MD

## 2021-05-24 NOTE — Patient Instructions (Signed)

## 2021-05-25 ENCOUNTER — Encounter: Payer: Self-pay | Admitting: Dermatology

## 2021-09-20 ENCOUNTER — Ambulatory Visit: Payer: BC Managed Care – PPO | Admitting: Dermatology

## 2021-11-08 DIAGNOSIS — Z9889 Other specified postprocedural states: Secondary | ICD-10-CM | POA: Insufficient documentation

## 2021-11-10 ENCOUNTER — Emergency Department
Admission: EM | Admit: 2021-11-10 | Discharge: 2021-11-10 | Disposition: A | Discharge status: Home / Self Care | Payer: BC Managed Care – PPO | Attending: Emergency Medicine | Admitting: Emergency Medicine

## 2021-11-10 ENCOUNTER — Other Ambulatory Visit: Payer: Self-pay

## 2021-11-10 ENCOUNTER — Emergency Department: Payer: BC Managed Care – PPO

## 2021-11-10 DIAGNOSIS — K5792 Diverticulitis of intestine, part unspecified, without perforation or abscess without bleeding: Secondary | ICD-10-CM | POA: Insufficient documentation

## 2021-11-10 DIAGNOSIS — I10 Essential (primary) hypertension: Secondary | ICD-10-CM | POA: Insufficient documentation

## 2021-11-10 DIAGNOSIS — M545 Low back pain, unspecified: Secondary | ICD-10-CM | POA: Diagnosis present

## 2021-11-10 LAB — CBC WITH DIFFERENTIAL/PLATELET
Abs Immature Granulocytes: 0.03 10*3/uL (ref 0.00–0.07)
Basophils Absolute: 0.1 10*3/uL (ref 0.0–0.1)
Basophils Relative: 1 %
Eosinophils Absolute: 0.2 10*3/uL (ref 0.0–0.5)
Eosinophils Relative: 4 %
HCT: 42.3 % (ref 39.0–52.0)
Hemoglobin: 14.1 g/dL (ref 13.0–17.0)
Immature Granulocytes: 1 %
Lymphocytes Relative: 25 %
Lymphs Abs: 1.5 10*3/uL (ref 0.7–4.0)
MCH: 29.7 pg (ref 26.0–34.0)
MCHC: 33.3 g/dL (ref 30.0–36.0)
MCV: 89.1 fL (ref 80.0–100.0)
Monocytes Absolute: 0.6 10*3/uL (ref 0.1–1.0)
Monocytes Relative: 9 %
Neutro Abs: 3.7 10*3/uL (ref 1.7–7.7)
Neutrophils Relative %: 60 %
Platelets: 321 10*3/uL (ref 150–400)
RBC: 4.75 MIL/uL (ref 4.22–5.81)
RDW: 13.7 % (ref 11.5–15.5)
WBC: 6.1 10*3/uL (ref 4.0–10.5)
nRBC: 0 % (ref 0.0–0.2)

## 2021-11-10 LAB — COMPREHENSIVE METABOLIC PANEL
ALT: 27 U/L (ref 0–44)
AST: 30 U/L (ref 15–41)
Albumin: 4.4 g/dL (ref 3.5–5.0)
Alkaline Phosphatase: 53 U/L (ref 38–126)
Anion gap: 12 (ref 5–15)
BUN: 22 mg/dL — ABNORMAL HIGH (ref 6–20)
CO2: 22 mmol/L (ref 22–32)
Calcium: 9.5 mg/dL (ref 8.9–10.3)
Chloride: 100 mmol/L (ref 98–111)
Creatinine, Ser: 0.86 mg/dL (ref 0.61–1.24)
GFR, Estimated: >60 mL/min (ref >60)
Glucose, Bld: 104 mg/dL — ABNORMAL HIGH (ref 70–99)
Potassium: 3.6 mmol/L (ref 3.5–5.1)
Sodium: 134 mmol/L — ABNORMAL LOW (ref 135–145)
Total Bilirubin: 0.9 mg/dL (ref 0.3–1.2)
Total Protein: 8.2 g/dL — ABNORMAL HIGH (ref 6.5–8.1)

## 2021-11-10 LAB — URINALYSIS, COMPLETE (UACMP) WITH MICROSCOPIC
Bilirubin Urine: NEGATIVE
Glucose, UA: NEGATIVE mg/dL
Hgb urine dipstick: NEGATIVE
Ketones, ur: NEGATIVE mg/dL
Nitrite: NEGATIVE
Protein, ur: NEGATIVE mg/dL
Specific Gravity, Urine: 1.017 (ref 1.005–1.030)
Squamous Epithelial / HPF: NONE SEEN (ref 0–5)
WBC, UA: NONE SEEN WBC/hpf (ref 0–5)
pH: 5 (ref 5.0–8.0)

## 2021-11-10 LAB — TROPONIN I (HIGH SENSITIVITY): Troponin I (High Sensitivity): 4 ng/L (ref <18)

## 2021-11-10 LAB — LIPASE, BLOOD: Lipase: 37 U/L (ref 11–51)

## 2021-11-10 IMAGING — CT CT ABD-PELV W/ CM
2 of 5 series · 16 of 46 positions shown, 18 images · IV contrast (APPLIED)
Comparison: 07/15/2012

CLINICAL DATA: Abdominal pain

EXAM:
CT ABDOMEN AND PELVIS WITH CONTRAST
TECHNIQUE: Multidetector CT imaging of the abdomen and pelvis was performed
using the standard protocol following bolus administration of
intravenous contrast.

[Series 2: routine abd/pel with · axial · 0.82mm/px · z∈[-586,-132]mm · 13 of 103 slices shown, 15 images]
[im 6/103  soft-tissue]
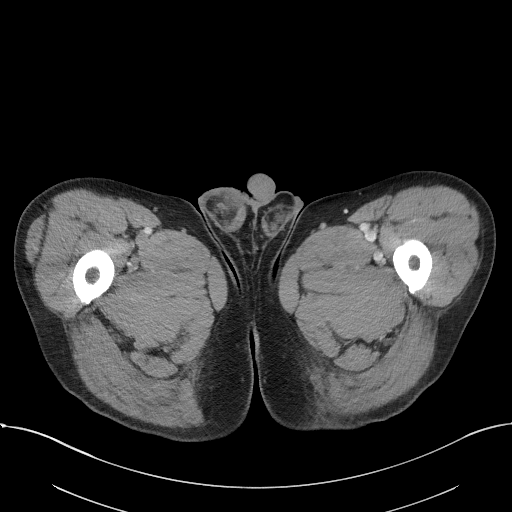
[im 6/103  bone]
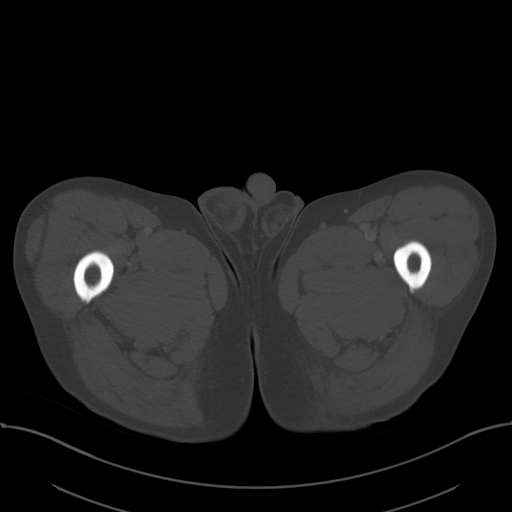
[im 12/103  soft-tissue]
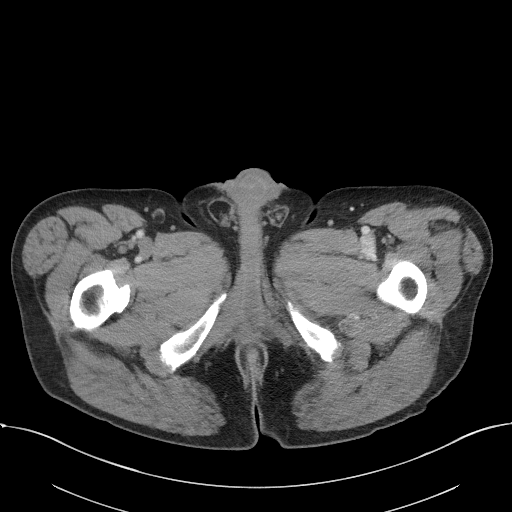
[im 23/103  soft-tissue]
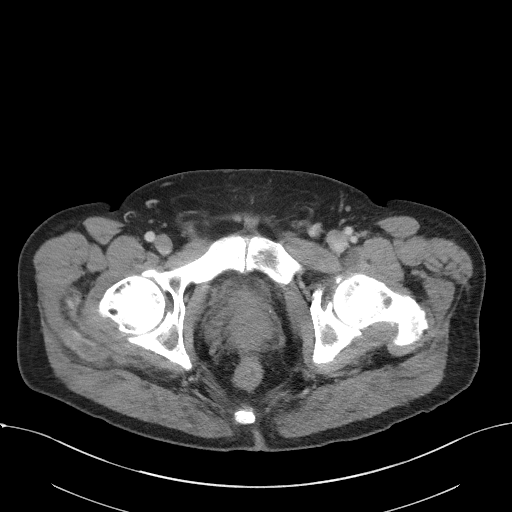
[im 29/103  soft-tissue]
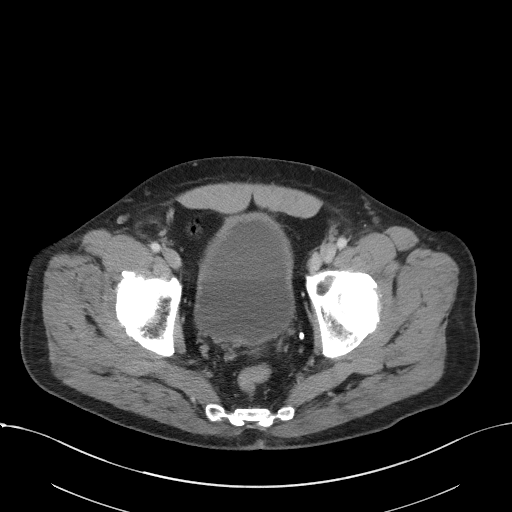
[im 35/103  soft-tissue]
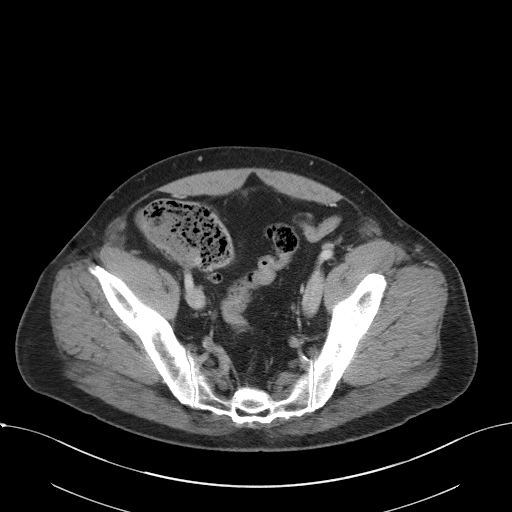
[im 46/103  soft-tissue]
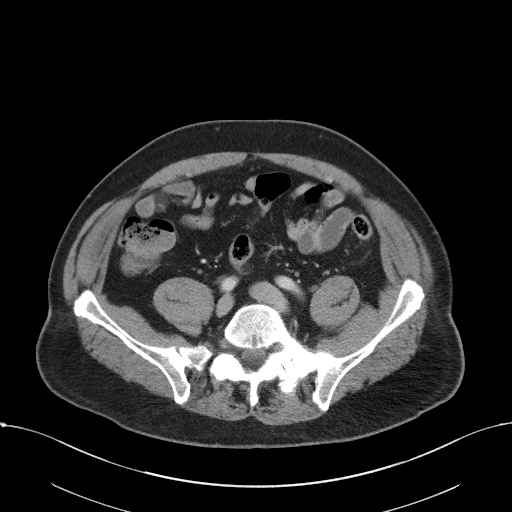
[im 52/103  soft-tissue]
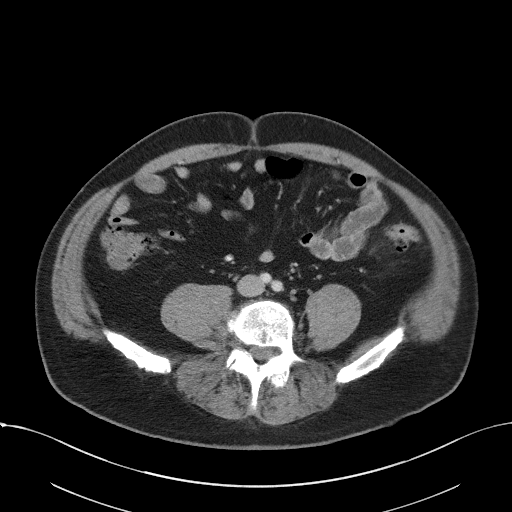
[im 57/103  soft-tissue]
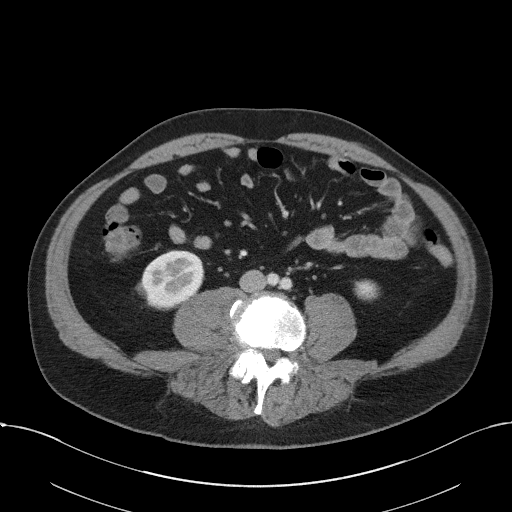
[im 69/103  soft-tissue]
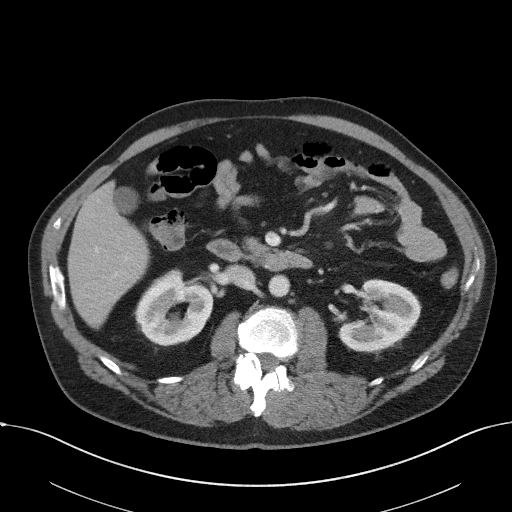
[im 69/103  bone]
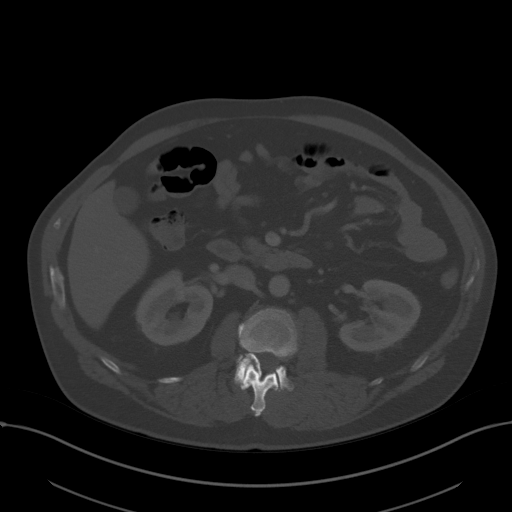
[im 74/103  soft-tissue]
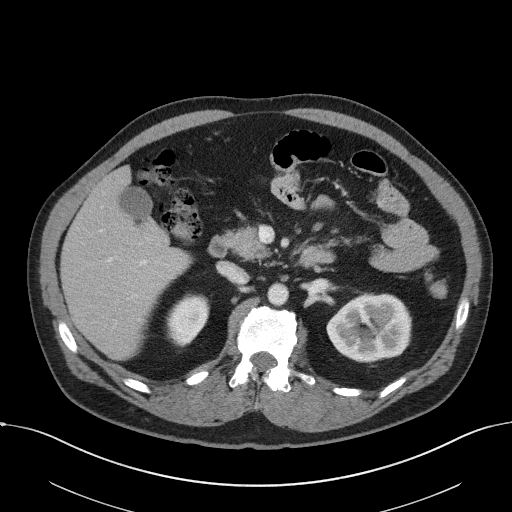
[im 80/103  soft-tissue]
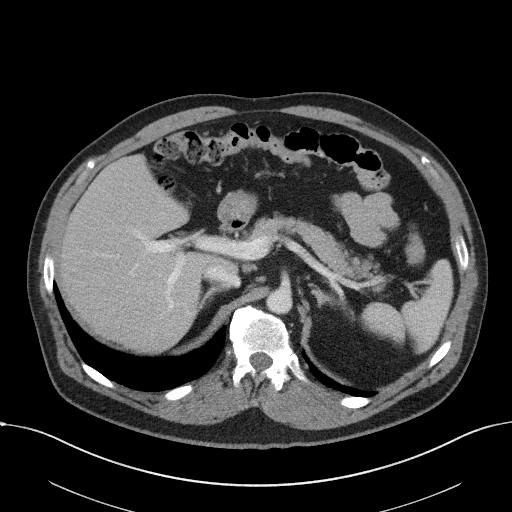
[im 91/103  soft-tissue]
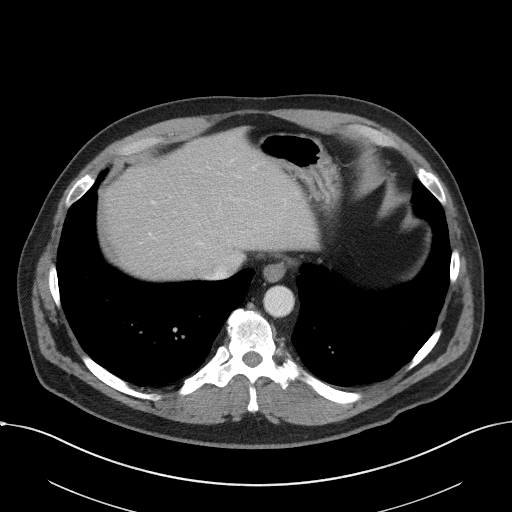
[im 97/103  soft-tissue]
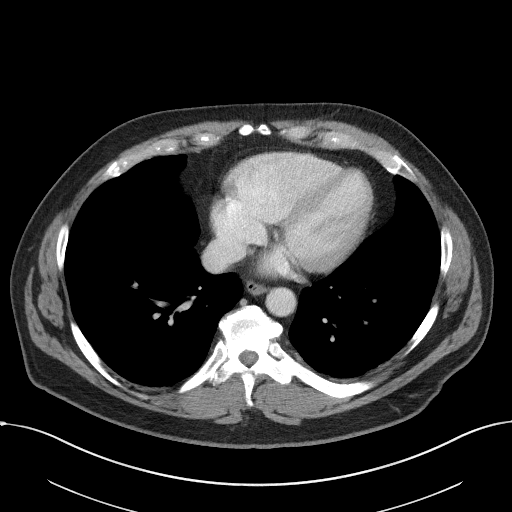

[Series 5: coronal st · coronal · 0.80mm/px · 3 of 100 slices shown]
[im 34/100  soft-tissue]
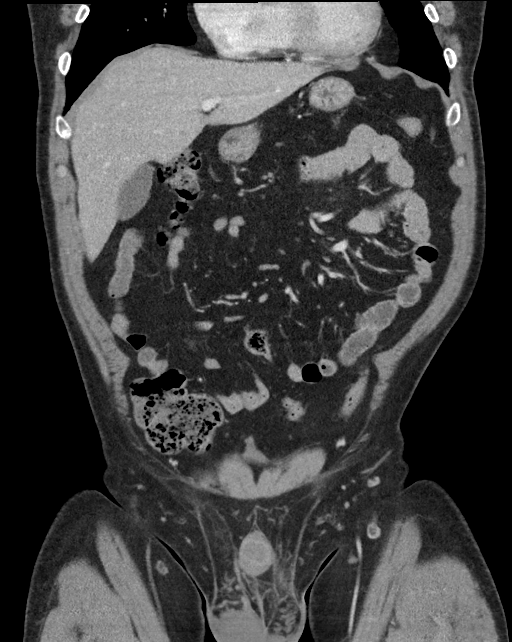
[im 45/100  soft-tissue]
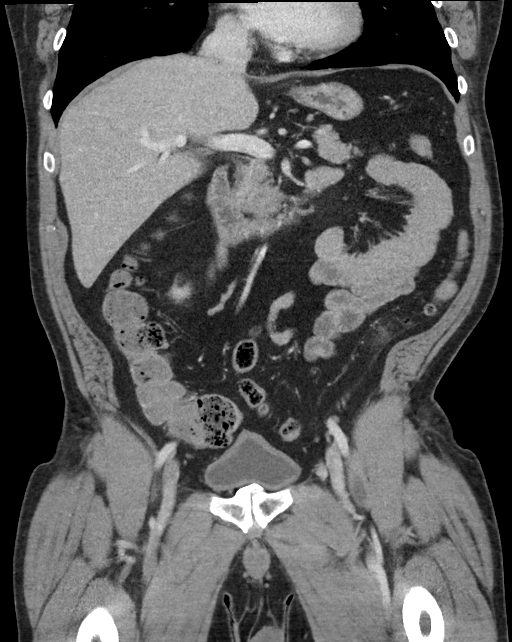
[im 56/100  soft-tissue]
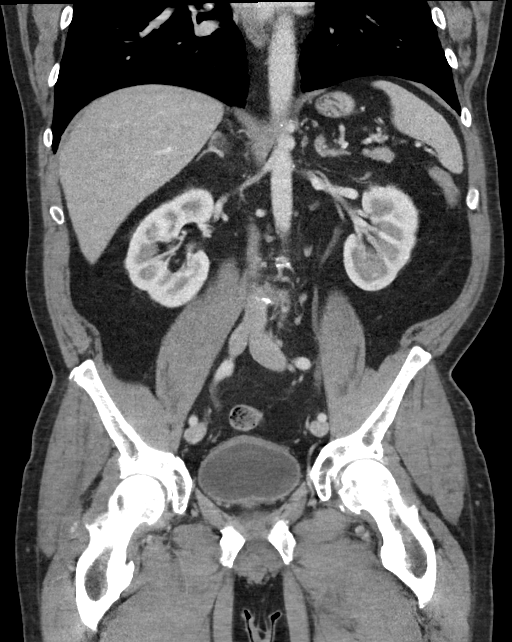

[16 of 46 positions shown; findings below may reference images not displayed]

RADIATION DOSE REDUCTION: This exam was performed according to the
departmental dose-optimization program which includes automated
exposure control, adjustment of the mA and/or kV according to
patient size and/or use of iterative reconstruction technique.

CONTRAST:  100mL OMNIPAQUE IOHEXOL 300 MG/ML  SOLN
FINDINGS: Lower chest: Included lung bases are clear.  Heart size is normal.

Hepatobiliary: No focal liver abnormality is seen. No gallstones,
gallbladder wall thickening, or biliary dilatation.

Pancreas: Unremarkable. No pancreatic ductal dilatation or
surrounding inflammatory changes.

Spleen: Normal in size without focal abnormality.

Adrenals/Urinary Tract: Unremarkable adrenal glands. Kidneys enhance
symmetrically without focal lesion, stone, or hydronephrosis.
Ureters are nondilated. Mild circumferential urinary bladder wall
thickening.

Stomach/Bowel: Stomach within normal limits. No dilated loops of
bowel. There is a single inflamed diverticula at the proximal
sigmoid colon with mild pericolonic fat stranding (series 2, image
54). No pericolonic fluid collection or abscess. No extraluminal
air. Remainder of the colon is within normal limits. Normal
appendix.

Vascular/Lymphatic: No significant vascular findings are present. No
enlarged abdominal or pelvic lymph nodes.

Reproductive: Prostate is unremarkable.

Other: No free fluid. No abdominopelvic fluid collection. No
pneumoperitoneum. No abdominal wall hernia.

Musculoskeletal: No acute or significant osseous findings. Lumbar
spondylosis.
IMPRESSION: 1. Acute uncomplicated sigmoid diverticulitis.
2. Mild circumferential urinary bladder wall thickening, which can
be seen in the setting of cystitis. Correlate with urinalysis.

## 2021-11-10 MED ORDER — MORPHINE SULFATE (PF) 4 MG/ML IV SOLN
4.0000 mg | Freq: Once | INTRAVENOUS | Status: AC
  Administered 2021-11-10: 4 mg via INTRAVENOUS
  Filled 2021-11-10: qty 1

## 2021-11-10 MED ORDER — LACTATED RINGERS IV BOLUS
1000.0000 mL | Freq: Once | INTRAVENOUS | Status: AC
  Administered 2021-11-10: 1000 mL via INTRAVENOUS

## 2021-11-10 MED ORDER — IOHEXOL 300 MG/ML  SOLN
100.0000 mL | Freq: Once | INTRAMUSCULAR | Status: AC | PRN
  Administered 2021-11-10: 100 mL via INTRAVENOUS
  Filled 2021-11-10: qty 100

## 2021-11-10 MED ORDER — OXYCODONE-ACETAMINOPHEN 5-325 MG PO TABS: 1.0000 | ORAL_TABLET | Freq: Three times a day (TID) | ORAL | 0 refills | Status: AC | PRN

## 2021-11-10 MED ORDER — OXYCODONE-ACETAMINOPHEN 5-325 MG PO TABS
1.0000 | ORAL_TABLET | Freq: Once | ORAL | Status: AC
  Administered 2021-11-10: 1 via ORAL
  Filled 2021-11-10: qty 1

## 2021-11-10 MED ORDER — KETOROLAC TROMETHAMINE 30 MG/ML IJ SOLN
15.0000 mg | Freq: Once | INTRAMUSCULAR | Status: AC
  Administered 2021-11-10: 15 mg via INTRAVENOUS
  Filled 2021-11-10: qty 1

## 2021-11-10 NOTE — ED Triage Notes (Signed)
Pt to ED for sharp, stabbing R lower back pain with abrupt onset since this morning.   Also has LLQ abdominal pain since 2-3 days, has outpatient CT scheduled, PCP told him may have diverticulosis  Denies vomiting, fevers. Denies hematuria.   EDP at bedside examining pt

## 2021-11-10 NOTE — ED Provider Notes (Signed)
[facility] Provider Note    Event Date/Time   First MD Initiated Contact with Patient 11/10/21 1113     (approximate)   History   Back Pain (R lower back pain sharp stabbing)   HPI  Todd Madden is a 61 y.o. male with a past medical history of HTN, HPL, gout, carpal tunnel syndrome, obesity, arthritis and recent PCP visit on 2/22 diagnosed clinically with possible diverticulitis due to some left lower quadrant Don[place] pain started on Cipro Flagyl who presents for evaluation of some right lower back pain.  Patient states his left-sided abdominal pain started on 2/21 but that has left lower back pain rating down the flank started today.  He denies any chest pain, cough, shortness of breath, urinary symptoms, blood in stool or urine, constipation or other clear associated symptoms.  He denies any history of diverticulitis or kidney stone.  He states he had 5 alcoholic beverages this past weekend 2 days before symptoms started but does not otherwise a heavy or daily drinker.  He denies any illicit drug use or tobacco abuse.  He states he had an outpatient CT scheduled but has not had a chance to get this done.  No other acute concerns at this time.      Physical Exam  Triage Vital Signs: ED Triage Vitals  Enc Vitals Group     BP      Pulse      Resp      Temp      Temp src      SpO2      Weight      Height      Head Circumference      Peak Flow      Pain Score      Pain Loc      Pain Edu?      Excl. in [place]?     Most recent vital signs: Vitals:   11/10/21 1122 11/10/21 1352  BP: 126/85 130/78  Pulse: 76 80  Resp: 16 16  Temp: 98 F (36.7 C)   SpO2: 97% 97%    General: Awake, no distress.  CV:  Good peripheral perfusion.  2+ radial pulses. Resp:  Normal effort.  Abd:  No distention.  Soft throughout. Other:  No CVA tenderness.  No lesions of the back.   ED Results / Procedures / Treatments  Labs (all labs ordered are listed, but only  abnormal results are displayed) Labs Reviewed  COMPREHENSIVE METABOLIC PANEL - Abnormal; Notable for the following components:      Result Value   Sodium 134 (*)    Glucose, Bld 104 (*)    BUN 22 (*)    Total Protein 8.2 (*)    All other components within normal limits  URINALYSIS, COMPLETE (UACMP) WITH MICROSCOPIC - Abnormal; Notable for the following components:   Color, Urine YELLOW (*)    APPearance CLEAR (*)    Leukocytes,Ua TRACE (*)    Bacteria, UA RARE (*)    All other components within normal limits  URINE CULTURE  CBC WITH DIFFERENTIAL/PLATELET  LIPASE, BLOOD  TROPONIN I (HIGH SENSITIVITY)     EKG  EKG is remarkable sinus rhythm with a ventricular rate of 67, normal axis, unremarkable intervals with nonspecific ST changes in lead III, aVF and V3 without other clear evidence of acute ischemia or significant rhythm.   RADIOLOGY CT abdomen pelvis on my interpretation shows some diverticulitis without abscess or perforation or  evidence of stone.  As reviewed radiologist interpretation agreed or findings of acute uncomplicated diverticulitis.  I also make note of some gallbladder wall thickening but no other acute process in the abdomen/pelvis such as a AAA, kidney stone or pyonephritis or appendicitis.   PROCEDURES:  Critical Care performed: No  Procedures   MEDICATIONS ORDERED IN ED: Medications  oxyCODONE-acetaminophen (PERCOCET/ROXICET) 5-325 MG per tablet 1 tablet (has no administration in time range)  morphine (PF) 4 MG/ML injection 4 mg (4 mg Intravenous Given 11/10/21 1137)  lactated ringers bolus 1,000 mL (0 mLs Intravenous Stopped 11/10/21 1310)  ketorolac (TORADOL) 30 MG/ML injection 15 mg (15 mg Intravenous Given 11/10/21 1308)  iohexol (OMNIPAQUE) 300 MG/ML solution 100 mL (100 mLs Intravenous Contrast Given 11/10/21 1355)     IMPRESSION / MDM / ASSESSMENT AND PLAN / ED COURSE  I reviewed the triage vital signs and the nursing notes.                               Differential diagnosis includes, but is not limited to diverticulitis, kidney stone, pancreatitis, cholecystitis, appendicitis, cystitis, pyelonephritis, enlarging AAA and possible atypical ACS.  ECG a nonelevated troponin and not suggestive of atypical ACS.  CMP without any significant electrolyte or metabolic derangements.  CBC without leukocytosis or acute anemia.  Lipase not consistent with pancreatitis.  UA has some bacteria and trace leukocyte esterase but otherwise unremarkable.  CT abdomen pelvis on my interpretation shows some diverticulitis without abscess or perforation or evidence of stone.  As reviewed radiologist interpretation agreed or findings of acute uncomplicated diverticulitis.  I also make note of some gallbladder wall thickening but no other acute process in the abdomen/pelvis such as a AAA, kidney stone or pyonephritis or appendicitis.  Suspect likely this acute uncomplicated diverticulitis as a source of patient symptoms.  I would lower suspicion for stone or other process.  There is no evidence of perforation or abscess and I do not believe patient is septic.  No significant nausea.  He is on appropriate antibiotics and just picked up his Cipro and Flagyl and started them yesterday.  We will add a prescription for pain.  Discussed observation additional diagnostic studies at this point the patient is stable for discharge to outpatient follow-up.  All radiology did make notation of some bladder wall thickening and there are some bacteria in his urine I have low suspicion for significant cystitis and his current antibiotics will treat this if it is present.  Recommended outpatient follow-up.  Discharged in stable condition.     FINAL CLINICAL IMPRESSION(S) / ED DIAGNOSES   Final diagnoses:  Diverticulitis     Rx / DC Orders   ED Discharge Orders          Ordered    oxyCODONE-acetaminophen (PERCOCET) 5-325 MG tablet  Every 8 hours PRN        11/10/21 1435              Note:  This document was prepared using Dragon voice recognition software and may include unintentional dictation errors.   Lucrezia Starch, MD 11/10/21 1438

## 2021-11-12 LAB — URINE CULTURE: Culture: NO GROWTH

## 2021-11-13 ENCOUNTER — Other Ambulatory Visit: Payer: Self-pay

## 2021-11-13 ENCOUNTER — Ambulatory Visit (INDEPENDENT_AMBULATORY_CARE_PROVIDER_SITE_OTHER): Payer: BC Managed Care – PPO | Admitting: Sports Medicine

## 2021-11-13 VITALS — BP 132/78 | HR 61 | Ht 67.0 in | Wt 195.0 lb

## 2021-11-13 DIAGNOSIS — M5431 Sciatica, right side: Secondary | ICD-10-CM | POA: Diagnosis not present

## 2021-11-13 DIAGNOSIS — G5701 Lesion of sciatic nerve, right lower limb: Secondary | ICD-10-CM

## 2021-11-13 MED ORDER — MELOXICAM 15 MG PO TABS: 15.0000 mg | ORAL_TABLET | Freq: Every day | ORAL | 0 refills | Status: DC

## 2021-11-13 MED ORDER — CYCLOBENZAPRINE HCL 10 MG PO TABS: 10.0000 mg | ORAL_TABLET | Freq: Every evening | ORAL | 0 refills | Status: DC | PRN

## 2021-11-13 NOTE — Progress Notes (Signed)
Benito Mccreedy D.[place] Phone: [phone]   Assessment and Plan:     1. Sciatica of right side 2. Piriformis syndrome, right -Acute, uncomplicated, initial sports medicine visit - Consistent with right-sided sciatica due to piriformis syndrome and gluteal muscular spasms - No red flag symptoms, no significant MOI, no signs of lumbar cause of sciatica, so no imaging obtained today - Start Flexeril 10 mg nightly as needed for muscle spasms - Start meloxicam 15 mg daily x2 weeks.  If still having pain after 2 weeks, complete 3rd-week of meloxicam. May use remaining meloxicam as needed once daily for pain control.  Do not to use additional NSAIDs while taking meloxicam.  May use Tylenol [phone] mg to 3 times a day for breakthrough pain. - Start HEP for piriformis and sciatica   Pertinent previous records reviewed include none   Follow Up: As needed if no improvement in 2 to 3 weeks.  Would obtain lumbar x-ray at that time.  Good discuss OMT   Subjective:   I, Moenique Parris, am serving as a Education administrator for Doctor Glennon Mac  Chief Complaint: sciatic pain   HPI:   11/13/21 Patient is  61 year old male complaining of sciatic pain . Patient states that he thinks his sciatic nerve is flared up  bad, gets numbness from his shin down to his foot, right sided pain no radiating pain, hurts to sit has to lay , back pain started Friday 11/10/2021 no MOI ,  pain got worse over the weekend , has been using heat , ice made it hurt , saw his massage lady   Relevant Historical Information: Hypertension, gout, recent bout of diverticulitis on antibiotics  Additional pertinent review of systems negative.   Current Outpatient Medications:    allopurinol (ZYLOPRIM) 100 MG tablet, TAKE ONE TABLET BY MOUTH EVERY DAY, Disp: , Rfl:    Aspirin Buf,CaCarb-MgCarb-MgO, 81 MG TABS, Take by mouth., Disp: , Rfl:    atorvastatin  (LIPITOR) 80 MG tablet, , Disp: , Rfl:    cimetidine (TAGAMET) 800 MG tablet, Take 1 tablet (800 mg total) by mouth at bedtime., Disp: 30 tablet, Rfl: 1   colchicine 0.6 MG tablet, TAKE 2 TABS @ ONSET OF FLARE FOLLOWED BY 1 TAB 1 HOUR LATER MAX OF 3 TABS IN 1 attack.  May repeat in 3 days, Disp: , Rfl:    cyclobenzaprine (FLEXERIL) 10 MG tablet, Take 1 tablet (10 mg total) by mouth at bedtime as needed for muscle spasms., Disp: 10 tablet, Rfl: 0   fexofenadine (ALLEGRA) 180 MG tablet, Take by mouth., Disp: , Rfl:    hydrochlorothiazide (HYDRODIURIL) 12.5 MG tablet, Take by mouth., Disp: , Rfl:    lisinopril (ZESTRIL) 20 MG tablet, , Disp: , Rfl:    meloxicam (MOBIC) 15 MG tablet, Take 1 tablet (15 mg total) by mouth daily., Disp: 30 tablet, Rfl: 0   NONFORMULARY OR COMPOUNDED ITEM, See pharmacy note, Disp: 120 each, Rfl: 2   oxyCODONE-acetaminophen (PERCOCET) 5-325 MG tablet, Take 1 tablet by mouth every 8 (eight) hours as needed for up to 5 days for severe pain., Disp: 15 tablet, Rfl: 0   terbinafine (LAMISIL) 250 MG tablet, Take 1 tablet (250 mg total) by mouth daily., Disp: 30 tablet, Rfl: 0   triamcinolone cream (KENALOG) 0.1 %, , Disp: , Rfl:    verapamil (CALAN-SR) 240 MG CR tablet, Take by mouth., Disp: , Rfl:  Objective:     Vitals:   11/13/21 1357  BP: 132/78  Pulse: 61  SpO2: 98%  Weight: 195 lb (88.5 kg)  Height: 5\' 7"  (1.702 m)      Body mass index is 30.54 kg/m.    Physical Exam:    General: awake, alert, and oriented no acute distress, nontoxic Skin: no suspicious lesions or rashes Neuro:sensation intact distally with no dificits, normal muscle tone, no atrophy, strength 5/5 in all tested lower ext groups Psych: normal mood and affect, speech clear  Right hip: No deformity, swelling or wasting ROM Fexion 80, ext 20, IR 45, ER 45 TTP gluteal musculature NTTP over the hip flexors, greater troch,   si joint, lumbar spine Negative log roll with FROM Positive FABER  for lateral and posterior hip discomfort Negative FADIR Positive piriformis test   Gait abnormal.  Walking with a limp, favoring left leg   Electronically signed by:  Benito Mccreedy D.[facility] 2:56 PM 11/13/21

## 2021-11-13 NOTE — Patient Instructions (Addendum)
Good to see you  - Start meloxicam 15 mg daily x2 weeks.  If still having pain after 2 weeks, complete 3rd-week of meloxicam. May use remaining meloxicam as needed once daily for pain control.  Do not to use additional NSAIDs while taking meloxicam.  May use Tylenol [phone] mg to 3 times a day for breakthrough pain. Flexeril 10 mg nightly as needed for muscle spasm 10 pill  Sciatic and piriformis HEP  As needed follow up if no improvement 2-3 week follow up

## 2022-01-15 ENCOUNTER — Ambulatory Visit: Payer: BC Managed Care – PPO | Admitting: Dermatology

## 2022-01-15 DIAGNOSIS — Z85828 Personal history of other malignant neoplasm of skin: Secondary | ICD-10-CM | POA: Diagnosis not present

## 2022-01-15 DIAGNOSIS — L820 Inflamed seborrheic keratosis: Secondary | ICD-10-CM

## 2022-01-15 DIAGNOSIS — L814 Other melanin hyperpigmentation: Secondary | ICD-10-CM

## 2022-01-15 DIAGNOSIS — L570 Actinic keratosis: Secondary | ICD-10-CM | POA: Diagnosis not present

## 2022-01-15 DIAGNOSIS — D485 Neoplasm of uncertain behavior of skin: Secondary | ICD-10-CM | POA: Diagnosis not present

## 2022-01-15 DIAGNOSIS — D1800 Hemangioma unspecified site: Secondary | ICD-10-CM

## 2022-01-15 DIAGNOSIS — L578 Other skin changes due to chronic exposure to nonionizing radiation: Secondary | ICD-10-CM | POA: Diagnosis not present

## 2022-01-15 DIAGNOSIS — Z1283 Encounter for screening for malignant neoplasm of skin: Secondary | ICD-10-CM

## 2022-01-15 DIAGNOSIS — D229 Melanocytic nevi, unspecified: Secondary | ICD-10-CM

## 2022-01-15 DIAGNOSIS — L821 Other seborrheic keratosis: Secondary | ICD-10-CM

## 2022-01-15 NOTE — Progress Notes (Signed)
? ?Follow-Up Visit ?  ?Subjective  ?Todd Madden is a 61 y.o. male who presents for the following: Actinic Keratosis (History of Aks of face and ears treated in the past with LN2/). ?The patient presents for Upper Body Skin Exam (UBSE) for skin cancer screening and mole check.  The patient has spots, moles and lesions to be evaluated, some may be new or changing and the patient has concerns that these could be cancer. ? ?The following portions of the chart were reviewed this encounter and updated as appropriate:  ? Tobacco  Allergies  Meds  Problems  Med Hx  Surg Hx  Fam Hx   ?  ?Review of Systems:  No other skin or systemic complaints except as noted in HPI or Assessment and Plan. ? ?Objective  ?Well appearing patient in no apparent distress; mood and affect are within normal limits. ? ?All skin waist up examined. ? ?Scalp, face (7) ?Erythematous thin papules/macules with gritty scale.  ? ?Left hand x 1, left elbow x 1 (2) ?Erythematous stuck-on, waxy papule or plaque ? ?Right post deltoid ?Pink patch ? ? ?Assessment & Plan  ? ?History of Basal Cell Carcinoma of the Skin ?- No evidence of recurrence today ?- Recommend regular full body skin exams ?- Recommend daily broad spectrum sunscreen SPF 30+ to sun-exposed areas, reapply every 2 hours as needed.  ?- Call if any new or changing lesions are noted between office visits  ? ?Lentigines ?- Scattered tan macules ?- Due to sun exposure ?- Benign-appearing, observe ?- Recommend daily broad spectrum sunscreen SPF 30+ to sun-exposed areas, reapply every 2 hours as needed. ?- Call for any changes ? ?Seborrheic Keratoses ?- Stuck-on, waxy, tan-brown papules and/or plaques  ?- Benign-appearing ?- Discussed benign etiology and prognosis. ?- Observe ?- Call for any changes ? ?Melanocytic Nevi ?- Tan-brown and/or pink-flesh-colored symmetric macules and papules ?- Benign appearing on exam today ?- Observation ?- Call clinic for new or changing moles ?- Recommend daily  use of broad spectrum spf 30+ sunscreen to sun-exposed areas.  ? ?Hemangiomas ?- Red papules ?- Discussed benign nature ?- Observe ?- Call for any changes ? ?Actinic Damage ?- Chronic condition, secondary to cumulative UV/sun exposure ?- diffuse scaly erythematous macules with underlying dyspigmentation ?- Recommend daily broad spectrum sunscreen SPF 30+ to sun-exposed areas, reapply every 2 hours as needed.  ?- Staying in the shade or wearing long sleeves, sun glasses (UVA+UVB protection) and wide brim hats (4-inch brim around the entire circumference of the hat) are also recommended for sun protection.  ?- Call for new or changing lesions. ? ?Skin cancer screening performed today. ? ?AK (actinic keratosis) (7) ?Scalp, face ? ?Destruction of lesion - Scalp, face ?Complexity: simple   ?Destruction method: cryotherapy   ?Informed consent: discussed and consent obtained   ?Timeout:  patient name, date of birth, surgical site, and procedure verified ?Lesion destroyed using liquid nitrogen: Yes   ?Region frozen until ice ball extended beyond lesion: Yes   ?Outcome: patient tolerated procedure well with no complications   ?Post-procedure details: wound care instructions given   ? ?Inflamed seborrheic keratosis (2) ?Left hand x 1, left elbow x 1 ? ?Destruction of lesion - Left hand x 1, left elbow x 1 ?Complexity: simple   ?Destruction method: cryotherapy   ?Informed consent: discussed and consent obtained   ?Timeout:  patient name, date of birth, surgical site, and procedure verified ?Lesion destroyed using liquid nitrogen: Yes   ?Region frozen until ice  ball extended beyond lesion: Yes   ?Outcome: patient tolerated procedure well with no complications   ?Post-procedure details: wound care instructions given   ? ?Neoplasm of uncertain behavior of skin ?Right post deltoid ? ?BCC vs other - will plan shave removal and EDC on follow up ? ?Skin cancer screening ? ? ?Return in about 3 months (around 04/17/2022). ? ?I, Ashok Cordia, CMA, am acting as scribe for Sarina Ser, MD . ?Documentation: I have reviewed the above documentation for accuracy and completeness, and I agree with the above. ? ?Sarina Ser, MD ? ?

## 2022-01-15 NOTE — Patient Instructions (Signed)

## 2022-01-23 ENCOUNTER — Encounter: Payer: Self-pay | Admitting: Dermatology

## 2022-04-18 ENCOUNTER — Ambulatory Visit: Payer: BC Managed Care – PPO | Admitting: Dermatology

## 2022-04-18 DIAGNOSIS — L570 Actinic keratosis: Secondary | ICD-10-CM | POA: Diagnosis not present

## 2022-04-18 DIAGNOSIS — D485 Neoplasm of uncertain behavior of skin: Secondary | ICD-10-CM

## 2022-04-18 DIAGNOSIS — L578 Other skin changes due to chronic exposure to nonionizing radiation: Secondary | ICD-10-CM

## 2022-04-18 DIAGNOSIS — C44612 Basal cell carcinoma of skin of right upper limb, including shoulder: Secondary | ICD-10-CM

## 2022-04-18 DIAGNOSIS — L820 Inflamed seborrheic keratosis: Secondary | ICD-10-CM

## 2022-04-18 MED ORDER — FLUOROURACIL 5 % EX CREA: TOPICAL_CREAM | CUTANEOUS | 0 refills | Status: DC

## 2022-04-18 NOTE — Patient Instructions (Addendum)
Start 5-fluorouracil/calcipotriene cream twice a day for 7 days to affected areas including the scalp. Prescription sent to [facility]. Patient provided with contact information for pharmacy and advised the pharmacy will mail the prescription to their home. Patient provided with handout reviewing treatment course and side effects and advised to call or message [place] on MyChart with any concerns.  5-Fluorouracil/Calcipotriene Patient Education   Actinic keratoses are the dry, red scaly spots on the skin caused by sun damage. A portion of these spots can turn into skin cancer with time, and treating them can help prevent development of skin cancer.   Treatment of these spots requires removal of the defective skin cells. There are various ways to remove actinic keratoses, including freezing with liquid nitrogen, treatment with creams, or treatment with a blue light procedure in the office.   5-fluorouracil cream is a topical cream used to treat actinic keratoses. It works by interfering with the growth of abnormal fast-growing skin cells, such as actinic keratoses. These cells peel off and are replaced by healthy ones.   5-fluorouracil/calcipotriene is a combination of the 5-fluorouracil cream with a vitamin D analog cream called calcipotriene. The calcipotriene alone does not treat actinic keratoses. However, when it is combined with 5-fluorouracil, it helps the 5-fluorouracil treat the actinic keratoses much faster so that the same results can be achieved with a much shorter treatment time.  INSTRUCTIONS FOR 5-FLUOROURACIL/CALCIPOTRIENE CREAM:   5-fluorouracil/calcipotriene cream typically only needs to be used for 4-7 days. A thin layer should be applied twice a day to the treatment areas recommended by your physician.   If your physician prescribed you separate tubes of 5-fluourouracil and calcipotriene, apply a thin layer of 5-fluorouracil followed by a thin layer of calcipotriene.   Avoid  contact with your eyes, nostrils, and mouth. Do not use 5-fluorouracil/calcipotriene cream on infected or open wounds.   You will develop redness, irritation and some crusting at areas where you have pre-cancer damage/actinic keratoses. IF YOU DEVELOP PAIN, BLEEDING, OR SIGNIFICANT CRUSTING, STOP THE TREATMENT EARLY - you have already gotten a good response and the actinic keratoses should clear up well.  Wash your hands after applying 5-fluorouracil 5% cream on your skin.   A moisturizer or sunscreen with a minimum SPF 30 should be applied each morning.   Once you have finished the treatment, you can apply a thin layer of Vaseline twice a day to irritated areas to soothe and calm the areas more quickly. If you experience significant discomfort, contact your physician.  For some patients it is necessary to repeat the treatment for best results.  SIDE EFFECTS: When using 5-fluorouracil/calcipotriene cream, you may have mild irritation, such as redness, dryness, swelling, or a mild burning sensation. This usually resolves within 2 weeks. The more actinic keratoses you have, the more redness and inflammation you can expect during treatment. Eye irritation has been reported rarely. If this occurs, please let us know.   If you have any trouble using this cream, please call the office. If you have any other questions about this information, please do not hesitate to ask me before you leave the office.   Electrodesiccation and Curettage ("Scrape and Burn") Wound Care Instructions  Leave the original bandage on for 24 hours if possible.  If the bandage becomes soaked or soiled before that time, it is OK to remove it and examine the wound.  A small amount of post-operative bleeding is normal.  If excessive bleeding occurs, remove the bandage, place gauze  over the site and apply continuous pressure (no peeking) over the area for 30 minutes. If this does not work, please call our clinic as soon as possible or  page your doctor if it is after hours.   Once a day, cleanse the wound with soap and water. It is fine to shower. If a thick crust develops you may use a Q-tip dipped into dilute hydrogen peroxide (mix 1:1 with water) to dissolve it.  Hydrogen peroxide can slow the healing process, so use it only as needed.    After washing, apply petroleum jelly (Vaseline) or an antibiotic ointment if your doctor prescribed one for you, followed by a bandage.    For best healing, the wound should be covered with a layer of ointment at all times. If you are not able to keep the area covered with a bandage to hold the ointment in place, this may mean re-applying the ointment several times a day.  Continue this wound care until the wound has healed and is no longer open. It may take several weeks for the wound to heal and close.  Itching and mild discomfort is normal during the healing process.  If you have any discomfort, you can take Tylenol (acetaminophen) or ibuprofen as directed on the bottle. (Please do not take these if you have an allergy to them or cannot take them for another reason).  Some redness, tenderness and white or yellow material in the wound is normal healing.  If the area becomes very sore and red, or develops a thick yellow-green material (pus), it may be infected; please notify us.    Wound healing continues for up to one year following surgery. It is not unusual to experience pain in the scar from time to time during the interval.  If the pain becomes severe or the scar thickens, you should notify the office.    A slight amount of redness in a scar is expected for the first six months.  After six months, the redness will fade and the scar will soften and fade.  The color difference becomes less noticeable with time.  If there are any problems, return for a post-op surgery check at your earliest convenience.  To improve the appearance of the scar, you can use silicone scar gel, cream, or sheets  (such as Mederma or Serica) every night for up to one year. These are available over the counter (without a prescription).  Please call our office at [phone] for any questions or concerns.  Due to recent changes in healthcare laws, you may see results of your pathology and/or laboratory studies on MyChart before the doctors have had a chance to review them. We understand that in some cases there may be results that are confusing or concerning to you. Please understand that not all results are received at the same time and often the doctors may need to interpret multiple results in order to provide you with the best plan of care or course of treatment. Therefore, we ask that you please give [place] 2 business days to thoroughly review all your results before contacting the office for clarification. Should we see a critical lab result, you will be contacted sooner.   If You Need Anything After Your Visit  If you have any questions or concerns for your doctor, please call our main line at [phone] and press option 4 to reach your doctor's medical assistant. If no one answers, please leave a voicemail as directed and we will return  your call as soon as possible. Messages left after 4 pm will be answered the following business day.   You may also send [place] a message via [place]. We typically respond to MyChart messages within 1-2 business days.  For prescription refills, please ask your pharmacy to contact our office. Our fax number is [phone].  If you have an urgent issue when the clinic is closed that cannot wait until the next business day, you can page your doctor at the number below.    Please note that while we do our best to be available for urgent issues outside of office hours, we are not available 24/7.   If you have an urgent issue and are unable to reach [place], you may choose to seek medical care at your doctor's office, retail clinic, urgent care center, or emergency room.  If you  have a medical emergency, please immediately call 911 or go to the emergency department.  Pager Numbers  - Dr. Nehemiah Massed: [phone]  - Dr. Laurence Ferrari: [phone]  - Dr. Nicole Kindred: [phone]  In the event of inclement weather, please call our main line at [phone] for an update on the status of any delays or closures.  Dermatology Medication Tips: Please keep the boxes that topical medications come in in order to help keep track of the instructions about where and how to use these. Pharmacies typically print the medication instructions only on the boxes and not directly on the medication tubes.   If your medication is too expensive, please contact our office at [phone] option 4 or send [place] a message through [place].   We are unable to tell what your co-pay for medications will be in advance as this is different depending on your insurance coverage. However, we may be able to find a substitute medication at lower cost or fill out paperwork to get insurance to cover a needed medication.   If a prior authorization is required to get your medication covered by your insurance company, please allow [place] 1-2 business days to complete this process.  Drug prices often vary depending on where the prescription is filled and some pharmacies may offer cheaper prices.  The website www.goodrx.com contains coupons for medications through different pharmacies. The prices here do not account for what the cost may be with help from insurance (it may be cheaper with your insurance), but the website can give you the price if you did not use any insurance.  - You can print the associated coupon and take it with your prescription to the pharmacy.  - You may also stop by our office during regular business hours and pick up a GoodRx coupon card.  - If you need your prescription sent electronically to a different pharmacy, notify our office through [facility] or by phone at [phone] option  4.     Si Usted Necesita Algo Despus de Su Visita  Tambin puede enviarnos un mensaje a travs de Pharmacist, community. Por lo general respondemos a los mensajes de MyChart en el transcurso de 1 a 2 das hbiles.  Para renovar recetas, por favor pida a su farmacia que se ponga en contacto con nuestra oficina. Harland Dingwall de fax es [place] [phone].  Si tiene un asunto urgente cuando la clnica est cerrada y que no puede esperar hasta el siguiente da hbil, puede llamar/localizar a su doctor(a) al nmero que aparece a continuacin.   Por favor, tenga en cuenta que aunque hacemos todo lo posible para estar disponibles para asuntos urgentes  fuera del horario de oficina, no estamos disponibles las 24 horas del da, los 7 das de la [place].   Si tiene un problema urgente y no puede comunicarse con nosotros, puede optar por buscar atencin mdica  en el consultorio de su doctor(a), en una clnica privada, en un centro de atencin urgente o en una sala de emergencias.  Si tiene Engineering geologist, por favor llame inmediatamente al 911 o vaya a la sala de emergencias.  Nmeros de bper  - Dr. Nehemiah Massed: [phone]  - Dra. Moye: [phone]  - Dra. Nicole Kindred: [phone]  En caso de inclemencias del [place], por favor llame a Johnsie Kindred principal al [phone] para una actualizacin sobre el [place] de cualquier retraso o cierre.  Consejos para la medicacin en dermatologa: Por favor, guarde las cajas en las que vienen los medicamentos de uso tpico para ayudarle a seguir las instrucciones sobre dnde y cmo usarlos. Las farmacias generalmente imprimen las instrucciones del medicamento slo en las cajas y no directamente en los tubos del [place].   Si su medicamento es muy caro, por favor, pngase en contacto con Zigmund Daniel llamando al [phone] y presione la opcin 4 o envenos un mensaje a travs de Pharmacist, community.   No podemos decirle cul ser su copago por los medicamentos por  adelantado ya que esto es diferente dependiendo de la cobertura de su seguro. Sin embargo, es posible que podamos encontrar un medicamento sustituto a Electrical engineer un formulario para que el seguro cubra el medicamento que se considera necesario.   Si se requiere una autorizacin previa para que su compaa de seguros [place] su medicamento, por favor permtanos de 1 a 2 das hbiles para completar este proceso.  Los precios de los medicamentos varan con frecuencia dependiendo del Environmental consultant de dnde se surte la receta y alguna farmacias pueden ofrecer precios ms baratos.  El sitio web www.goodrx.com tiene cupones para medicamentos de Airline pilot. Los precios aqu no tienen en cuenta lo que podra costar con la ayuda del seguro (puede ser ms barato con su seguro), pero el sitio web puede darle el precio si no utiliz Research scientist (physical sciences).  - Puede imprimir el cupn correspondiente y llevarlo con su receta a la farmacia.  - Tambin puede pasar por nuestra oficina durante el horario de atencin regular y Charity fundraiser una tarjeta de cupones de GoodRx.  - Si necesita que su receta se enve electrnicamente a una farmacia diferente, informe a nuestra oficina a travs de MyChart de [place] o por telfono llamando al [phone] y presione la opcin 4.

## 2022-04-18 NOTE — Progress Notes (Unsigned)
Follow-Up Visit   Subjective  Todd Madden is a 61 y.o. male who presents for the following: Irregular skin lesion (R post deltoid - patient is here today for biopsy and ED&C ) and Actinic Keratosis (Face and scalp - check for new or persistent precancerous skin lesions). The patient has spots, moles and lesions to be evaluated, some may be new or changing and the patient has concerns that these could be cancer.  The following portions of the chart were reviewed this encounter and updated as appropriate:   Tobacco  Allergies  Meds  Problems  Med Hx  Surg Hx  Fam Hx     Review of Systems:  No other skin or systemic complaints except as noted in HPI or Assessment and Plan.  Objective  Well appearing patient in no apparent distress; mood and affect are within normal limits.  A focused examination was performed including the face, trunk, extremities. Relevant physical exam findings are noted in the Assessment and Plan.  R post deltoid Pink patch 2.1 cm   R back x 1, R foot x 1 (2) Erythematous stuck-on, waxy papule or plaque  Scalp Erythematous thin papules/macules with gritty scale.    Assessment & Plan  Neoplasm of uncertain behavior of skin R post deltoid Epidermal / dermal shaving  Lesion diameter (cm):  2.1 Informed consent: discussed and consent obtained   Timeout: patient name, date of birth, surgical site, and procedure verified   Procedure prep:  Patient was prepped and draped in usual sterile fashion Prep type:  Isopropyl alcohol Anesthesia: the lesion was anesthetized in a standard fashion   Anesthetic:  1% lidocaine w/ epinephrine 1-100,000 buffered w/ 8.4% NaHCO3 Instrument used: flexible razor blade   Hemostasis achieved with: pressure, aluminum chloride and electrodesiccation   Outcome: patient tolerated procedure well   Post-procedure details: sterile dressing applied and wound care instructions given   Dressing type: bandage and petrolatum     Destruction of lesion Complexity: extensive   Destruction method: electrodesiccation and curettage   Informed consent: discussed and consent obtained   Timeout:  patient name, date of birth, surgical site, and procedure verified Procedure prep:  Patient was prepped and draped in usual sterile fashion Prep type:  Isopropyl alcohol Anesthesia: the lesion was anesthetized in a standard fashion   Anesthetic:  1% lidocaine w/ epinephrine 1-100,000 buffered w/ 8.4% NaHCO3 Curettage performed in three different directions: Yes   Electrodesiccation performed over the curetted area: Yes   Lesion length (cm):  2.1 Lesion width (cm):  2.1 Margin per side (cm):  0.2 Final wound size (cm):  2.5 Hemostasis achieved with:  pressure, aluminum chloride and electrodesiccation Outcome: patient tolerated procedure well with no complications   Post-procedure details: sterile dressing applied and wound care instructions given   Dressing type: bandage and petrolatum    Specimen 1 - Surgical pathology Differential Diagnosis: D48.5 r/o BCC ED&C today  Check Margins: No  Inflamed seborrheic keratosis (2) R back x 1, R foot x 1 Destruction of lesion - R back x 1, R foot x 1 Complexity: simple   Destruction method: cryotherapy   Informed consent: discussed and consent obtained   Timeout:  patient name, date of birth, surgical site, and procedure verified Lesion destroyed using liquid nitrogen: Yes   Region frozen until ice ball extended beyond lesion: Yes   Outcome: patient tolerated procedure well with no complications   Post-procedure details: wound care instructions given    AK (actinic keratosis) Scalp  Start 5FU/Calcipotriene cream BID x 7 days on the scalp. fluorouracil (EFUDEX) 5 % cream - Scalp Apply to the scalp BID x 7 days  Actinic Damage - Severe, confluent actinic changes with pre-cancerous actinic keratoses  - Severe, chronic, not at goal, secondary to cumulative UV radiation  exposure over time - diffuse scaly erythematous macules and papules with underlying dyspigmentation - Discussed Prescription "Field Treatment" for Severe, Chronic Confluent Actinic Changes with Pre-Cancerous Actinic Keratoses Field treatment involves treatment of an entire area of skin that has confluent Actinic Changes (Sun/ Ultraviolet light damage) and PreCancerous Actinic Keratoses by method of PhotoDynamic Therapy (PDT) and/or prescription Topical Chemotherapy agents such as 5-fluorouracil, 5-fluorouracil/calcipotriene, and/or imiquimod.  The purpose is to decrease the number of clinically evident and subclinical PreCancerous lesions to prevent progression to development of skin cancer by chemically destroying early precancer changes that may or may not be visible.  It has been shown to reduce the risk of developing skin cancer in the treated area. As a result of treatment, redness, scaling, crusting, and open sores may occur during treatment course. One or more than one of these methods may be used and may have to be used several times to control, suppress and eliminate the PreCancerous changes. Discussed treatment course, expected reaction, and possible side effects. - Recommend daily broad spectrum sunscreen SPF 30+ to sun-exposed areas, reapply every 2 hours as needed.  - Staying in the shade or wearing long sleeves, sun glasses (UVA+UVB protection) and wide brim hats (4-inch brim around the entire circumference of the hat) are also recommended. - Call for new or changing lesions. - Start  5FU/Calcipotriene cream BID x 1 week on the scalp.  Return in about 6 months (around 10/19/2022).  Luther Redo, CMA, am acting as scribe for Sarina Ser, MD . Documentation: I have reviewed the above documentation for accuracy and completeness, and I agree with the above.  Sarina Ser, MD

## 2022-04-19 ENCOUNTER — Telehealth: Payer: Self-pay

## 2022-04-19 ENCOUNTER — Encounter: Payer: Self-pay | Admitting: Dermatology

## 2022-04-19 NOTE — Telephone Encounter (Signed)
Advised pt of bx results/sh ?

## 2022-04-19 NOTE — Telephone Encounter (Signed)
-----   Message from Ralene Bathe, MD sent at 04/19/2022  5:12 PM EDT ----- Diagnosis Skin , right post deltoid SUPERFICIAL BASAL CELL CARCINOMA, CLOSE TO MARGIN  Cancer - BCC Superficial Already treated Recheck next visit

## 2022-04-24 NOTE — Progress Notes (Deleted)
[place] Phone: [phone] Subjective:    I'm seeing this patient by the request  of:  Olmedo, Guy Begin, MD  CC:   [id number]  Todd Madden is a 61 y.o. male coming in with complaint of LBP. Saw Dr. Glennon Mac in February. Patient states        Past Medical History:  Diagnosis Date   Actinic keratosis    Basal cell carcinoma 04/18/2020   Right post. shoulder. Superficial.    Basal cell carcinoma 08/16/2020   R mid back    Basal cell carcinoma 10/12/2020   right distal lat tricep above elbow, right distal lat tricep above elbow medial - EDC x 2   Basal cell carcinoma 12/05/2020   right ant lat deltoid, edc   Basal cell carcinoma 04/18/2022   R post deltoid, EDC   Skin cancer    unknown type - treated by Dr. Evorn Gong    No past surgical history on file. Social History   Socioeconomic History   Marital status: Married    Spouse name: Not on file   Number of children: Not on file   Years of education: Not on file   Highest education level: Not on file  Occupational History   Not on file  Tobacco Use   Smoking status: Never   Smokeless tobacco: Never  Substance and Sexual Activity   Alcohol use: Yes    Alcohol/week: 6.0 - 8.0 standard drinks of alcohol    Types: 6 - 8 Cans of beer per week    Comment: 6-8 beers weekly   Drug use: Never   Sexual activity: Not on file  Other Topics Concern   Not on file  Social History Narrative   Not on file   Social Determinants of Health   Financial Resource Strain: Not on file  Food Insecurity: Not on file  Transportation Needs: Not on file  Physical Activity: Not on file  Stress: Not on file  Social Connections: Not on file   No Known Allergies No family history on file.   Current Outpatient Medications (Cardiovascular):    atorvastatin (LIPITOR) 80 MG tablet,    hydrochlorothiazide (HYDRODIURIL) 12.5 MG tablet, Take by mouth.    lisinopril (ZESTRIL) 20 MG tablet,    verapamil (CALAN-SR) 240 MG CR tablet, Take by mouth.  Current Outpatient Medications (Respiratory):    fexofenadine (ALLEGRA) 180 MG tablet, Take by mouth.  Current Outpatient Medications (Analgesics):    allopurinol (ZYLOPRIM) 100 MG tablet, TAKE ONE TABLET BY MOUTH EVERY DAY   Aspirin Buf,CaCarb-MgCarb-MgO, 81 MG TABS, Take by mouth.   colchicine 0.6 MG tablet, TAKE 2 TABS @ ONSET OF FLARE FOLLOWED BY 1 TAB 1 HOUR LATER MAX OF 3 TABS IN 1 attack.  May repeat in 3 days   meloxicam (MOBIC) 15 MG tablet, Take 1 tablet (15 mg total) by mouth daily.   Current Outpatient Medications (Other):    cimetidine (TAGAMET) 800 MG tablet, Take 1 tablet (800 mg total) by mouth at bedtime.   cyclobenzaprine (FLEXERIL) 10 MG tablet, Take 1 tablet (10 mg total) by mouth at bedtime as needed for muscle spasms.   fluorouracil (EFUDEX) 5 % cream, Apply to the scalp BID x 7 days   NONFORMULARY OR COMPOUNDED ITEM, See pharmacy note   terbinafine (LAMISIL) 250 MG tablet, Take 1 tablet (250 mg total) by mouth daily.   triamcinolone cream (KENALOG) 0.1 %,    Reviewed  prior external information including notes and imaging from  primary care provider As well as notes that were available from care everywhere and other healthcare systems.  Past medical history, social, surgical and family history all reviewed in electronic medical record.  No pertanent information unless stated regarding to the chief complaint.   Review of Systems:  No headache, visual changes, nausea, vomiting, diarrhea, constipation, dizziness, abdominal pain, skin rash, fevers, chills, night sweats, weight loss, swollen lymph nodes, body aches, joint swelling, chest pain, shortness of breath, mood changes. POSITIVE muscle aches  Objective  There were no vitals taken for this visit.   General: No apparent distress alert and oriented x3 mood and affect normal, dressed appropriately.  HEENT: Pupils equal,  extraocular movements intact  Respiratory: Patient's speak in full sentences and does not appear short of breath  Cardiovascular: No lower extremity edema, non tender, no erythema      Impression and Recommendations:

## 2022-04-26 ENCOUNTER — Ambulatory Visit: Payer: BC Managed Care – PPO | Admitting: Family Medicine

## 2022-05-14 NOTE — Progress Notes (Unsigned)
[place] [address] [place] Phone: [phone] Subjective:   IVilma Meckel, am serving as a scribe for Dr. Hulan Saas.  I'm seeing this patient by the request  of:  Valera Castle, MD  CC: low back pain   [id number]  Saw Dr. Glennon Mac in Feb 2023 for piriformis pain  Todd Madden is a 61 y.o. male coming in with complaint of low back pain. Chronic back pain. Right side pain radiating down back of thigh. Right shin numb since March. Had MRI done in March did 3 epidurals no relief, PT no relief, tried ablation.       Past Medical History:  Diagnosis Date   Actinic keratosis    Basal cell carcinoma 04/18/2020   Right post. shoulder. Superficial.    Basal cell carcinoma 08/16/2020   R mid back    Basal cell carcinoma 10/12/2020   right distal lat tricep above elbow, right distal lat tricep above elbow medial - EDC x 2   Basal cell carcinoma 12/05/2020   right ant lat deltoid, edc   Basal cell carcinoma 04/18/2022   R post deltoid, EDC   Skin cancer    unknown type - treated by Dr. Evorn Gong    No past surgical history on file. Social History   Socioeconomic History   Marital status: Married    Spouse name: Not on file   Number of children: Not on file   Years of education: Not on file   Highest education level: Not on file  Occupational History   Not on file  Tobacco Use   Smoking status: Never   Smokeless tobacco: Never  Substance and Sexual Activity   Alcohol use: Yes    Alcohol/week: 6.0 - 8.0 standard drinks of alcohol    Types: 6 - 8 Cans of beer per week    Comment: 6-8 beers weekly   Drug use: Never   Sexual activity: Not on file  Other Topics Concern   Not on file  Social History Narrative   Not on file   Social Determinants of Health   Financial Resource Strain: Not on file  Food Insecurity: Not on file  Transportation Needs: Not on file  Physical Activity: Not on file  Stress: Not  on file  Social Connections: Not on file   No Known Allergies No family history on file.   Current Outpatient Medications (Cardiovascular):    atorvastatin (LIPITOR) 80 MG tablet,    hydrochlorothiazide (HYDRODIURIL) 12.5 MG tablet, Take by mouth.   lisinopril (ZESTRIL) 20 MG tablet,    verapamil (CALAN-SR) 240 MG CR tablet, Take by mouth.  Current Outpatient Medications (Respiratory):    fexofenadine (ALLEGRA) 180 MG tablet, Take by mouth.  Current Outpatient Medications (Analgesics):    allopurinol (ZYLOPRIM) 100 MG tablet, TAKE ONE TABLET BY MOUTH EVERY DAY   Aspirin Buf,CaCarb-MgCarb-MgO, 81 MG TABS, Take by mouth.   colchicine 0.6 MG tablet, TAKE 2 TABS @ ONSET OF FLARE FOLLOWED BY 1 TAB 1 HOUR LATER MAX OF 3 TABS IN 1 attack.  May repeat in 3 days   meloxicam (MOBIC) 15 MG tablet, Take 1 tablet (15 mg total) by mouth daily.   Current Outpatient Medications (Other):    cimetidine (TAGAMET) 800 MG tablet, Take 1 tablet (800 mg total) by mouth at bedtime.   cyclobenzaprine (FLEXERIL) 10 MG tablet, Take 1 tablet (10 mg total) by mouth at bedtime as needed for muscle spasms.  fluorouracil (EFUDEX) 5 % cream, Apply to the scalp BID x 7 days   NONFORMULARY OR COMPOUNDED ITEM, See pharmacy note   terbinafine (LAMISIL) 250 MG tablet, Take 1 tablet (250 mg total) by mouth daily.   triamcinolone cream (KENALOG) 0.1 %,    Reviewed prior external information including notes and imaging from  primary care provider As well as notes that were available from care everywhere and other healthcare systems.  Past medical history, social, surgical and family history all reviewed in electronic medical record.  No pertanent information unless stated regarding to the chief complaint.   Review of Systems:  No headache, visual changes, nausea, vomiting, diarrhea, constipation, dizziness, abdominal pain, skin rash, fevers, chills, night sweats, weight loss, swollen lymph nodes, body aches, joint  swelling, chest pain, shortness of breath, mood changes. POSITIVE muscle aches  Objective  Blood pressure 122/70, pulse 89, height '5\' 7"'$  (1.702 m), weight 201 lb (91.2 kg), SpO2 95 %.   General: No apparent distress alert and oriented x3 mood and affect normal, dressed appropriately.  HEENT: Pupils equal, extraocular movements intact  Respiratory: Patient's speak in full sentences and does not appear short of breath  Cardiovascular: No lower extremity edema, non tender, no erythema  Low back exam shows loss of lordosis noted.  Patient does have significant tightness with FABER test bilaterally.  Tenderness to palpation in the piriformis and the right sacroiliac joint. Leg length discrepancy noted on the left leg being shorter by approximately quarter of an inch does seem to get better though with patient laying down.  Osteopathic findings  T8 extended rotated and side bent left L1 flexed rotated and side bent right L5 flexed rotated and side bent left Sacrum right on right     Impression and Recommendations:    The above documentation has been reviewed and is accurate and complete Lyndal Pulley, DO

## 2022-05-15 ENCOUNTER — Ambulatory Visit (INDEPENDENT_AMBULATORY_CARE_PROVIDER_SITE_OTHER): Payer: BC Managed Care – PPO

## 2022-05-15 ENCOUNTER — Ambulatory Visit: Payer: BC Managed Care – PPO | Admitting: Family Medicine

## 2022-05-15 VITALS — BP 122/70 | HR 89 | Ht 67.0 in | Wt 201.0 lb

## 2022-05-15 DIAGNOSIS — M9904 Segmental and somatic dysfunction of sacral region: Secondary | ICD-10-CM

## 2022-05-15 DIAGNOSIS — M2170 Unequal limb length (acquired), unspecified site: Secondary | ICD-10-CM

## 2022-05-15 DIAGNOSIS — M2550 Pain in unspecified joint: Secondary | ICD-10-CM | POA: Diagnosis not present

## 2022-05-15 DIAGNOSIS — G5701 Lesion of sciatic nerve, right lower limb: Secondary | ICD-10-CM

## 2022-05-15 DIAGNOSIS — M7918 Myalgia, other site: Secondary | ICD-10-CM

## 2022-05-15 DIAGNOSIS — M9903 Segmental and somatic dysfunction of lumbar region: Secondary | ICD-10-CM

## 2022-05-15 DIAGNOSIS — M9902 Segmental and somatic dysfunction of thoracic region: Secondary | ICD-10-CM | POA: Diagnosis not present

## 2022-05-15 LAB — CBC WITH DIFFERENTIAL/PLATELET
Basophils Absolute: 0 10*3/uL (ref 0.0–0.1)
Basophils Relative: 0.2 % (ref 0.0–3.0)
Eosinophils Absolute: 0.3 10*3/uL (ref 0.0–0.7)
Eosinophils Relative: 3.9 % (ref 0.0–5.0)
HCT: 41.4 % (ref 39.0–52.0)
Hemoglobin: 14 g/dL (ref 13.0–17.0)
Lymphocytes Relative: 24.1 % (ref 12.0–46.0)
Lymphs Abs: 1.6 10*3/uL (ref 0.7–4.0)
MCHC: 33.8 g/dL (ref 30.0–36.0)
MCV: 91.1 fl (ref 78.0–100.0)
Monocytes Absolute: 0.7 10*3/uL (ref 0.1–1.0)
Monocytes Relative: 11 % (ref 3.0–12.0)
Neutro Abs: 4 10*3/uL (ref 1.4–7.7)
Neutrophils Relative %: 60.8 % (ref 43.0–77.0)
Platelets: 299 10*3/uL (ref 150.0–400.0)
RBC: 4.55 Mil/uL (ref 4.22–5.81)
RDW: 13.3 % (ref 11.5–15.5)
WBC: 6.6 10*3/uL (ref 4.0–10.5)

## 2022-05-15 LAB — TSH: TSH: 2.14 u[IU]/mL (ref 0.35–5.50)

## 2022-05-15 LAB — TESTOSTERONE: Testosterone: 302.59 ng/dL (ref 300.00–890.00)

## 2022-05-15 LAB — FERRITIN: Ferritin: 211.7 ng/mL (ref 22.0–322.0)

## 2022-05-15 LAB — VITAMIN D 25 HYDROXY (VIT D DEFICIENCY, FRACTURES): VITD: 21.37 ng/mL — ABNORMAL LOW (ref 30.00–100.00)

## 2022-05-15 LAB — SEDIMENTATION RATE: Sed Rate: 34 mm/hr — ABNORMAL HIGH (ref 0–20)

## 2022-05-15 NOTE — Assessment & Plan Note (Signed)
Known arthritic changes of the mid back and patient has had discomfort there for some time.  Felt that there was more secondary to radiation of the pain but has had 3 epidurals with no significant improvement.  Patient has had more pain and feels like it is starting to affect daily activities.  Starting to affect quality of life as well.  We will get laboratory work-up to further evaluate.  Do feel that a possible MRI of the pelvis could be beneficial as well.  Patient did have a diverticulitis flare before this all seem to start.  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  Heel lift given for the left side to see if this does help.  Follow-up with me again in 6 weeks.  Did respond to some osteopathic manipulation as well.

## 2022-05-15 NOTE — Assessment & Plan Note (Signed)

## 2022-05-15 NOTE — Assessment & Plan Note (Signed)
Required for left-sided knee replacement.  Heel lift given for the knee with 5 being shorter.

## 2022-05-15 NOTE — Patient Instructions (Addendum)
Labs today Xrays today [facility] Call Today  When we receive your results we will contact you.  Heel lift left side

## 2022-05-16 ENCOUNTER — Other Ambulatory Visit: Payer: Self-pay

## 2022-05-16 ENCOUNTER — Other Ambulatory Visit: Payer: Self-pay | Admitting: Family Medicine

## 2022-05-16 DIAGNOSIS — M2550 Pain in unspecified joint: Secondary | ICD-10-CM

## 2022-05-16 LAB — COMPREHENSIVE METABOLIC PANEL
ALT: 39 U/L (ref 0–53)
AST: 33 U/L (ref 0–37)
Albumin: 4.6 g/dL (ref 3.5–5.2)
Alkaline Phosphatase: 59 U/L (ref 39–117)
BUN: 17 mg/dL (ref 6–23)
CO2: 22 mEq/L (ref 19–32)
Calcium: 9.9 mg/dL (ref 8.4–10.5)
Chloride: 100 mEq/L (ref 96–112)
Creatinine, Ser: 0.93 mg/dL (ref 0.40–1.50)
GFR: 89.03 mL/min (ref >60.00)
Glucose, Bld: 94 mg/dL (ref 70–99)
Potassium: 3.7 mEq/L (ref 3.5–5.1)
Sodium: 136 mEq/L (ref 135–145)
Total Bilirubin: 0.6 mg/dL (ref 0.2–1.2)
Total Protein: 8.1 g/dL (ref 6.0–8.3)

## 2022-05-16 LAB — IBC PANEL
Iron: 68 ug/dL (ref 42–165)
Saturation Ratios: 16.9 % — ABNORMAL LOW (ref 20.0–50.0)
TIBC: 401.8 ug/dL (ref 250.0–450.0)
Transferrin: 287 mg/dL (ref 212.0–360.0)

## 2022-05-16 LAB — URIC ACID: Uric Acid, Serum: 6.9 mg/dL (ref 4.0–7.8)

## 2022-05-16 LAB — C-REACTIVE PROTEIN: CRP: <1 mg/dL (ref 0.5–20.0)

## 2022-05-16 MED ORDER — VITAMIN D (ERGOCALCIFEROL) 1.25 MG (50000 UNIT) PO CAPS: 50000.0000 [IU] | ORAL_CAPSULE | ORAL | 0 refills | Status: DC

## 2022-05-17 LAB — RHEUMATOID FACTOR: Rheumatoid fact SerPl-aCnc: <14 IU/mL (ref <14)

## 2022-05-17 LAB — ANA: Anti Nuclear Antibody (ANA): POSITIVE — AB

## 2022-05-17 LAB — TEST AUTHORIZATION

## 2022-05-17 LAB — PTH, INTACT AND CALCIUM
Calcium: 9.8 mg/dL (ref 8.6–10.3)
PTH: 25 pg/mL (ref 16–77)

## 2022-05-17 LAB — CALCIUM, IONIZED: Calcium, Ion: 5.1 mg/dL (ref 4.7–5.5)

## 2022-05-17 LAB — CYCLIC CITRUL PEPTIDE ANTIBODY, IGG: Cyclic Citrullin Peptide Ab: <16 UNITS

## 2022-05-17 LAB — ANTI-NUCLEAR AB-TITER (ANA TITER): ANA Titer 1: 1:160 {titer} — ABNORMAL HIGH

## 2022-05-17 LAB — ANGIOTENSIN CONVERTING ENZYME: Angiotensin-Converting Enzyme: <5 U/L — ABNORMAL LOW (ref 9–67)

## 2022-05-17 LAB — PSA: PSA: 1.44 ng/mL (ref <=4.00)

## 2022-05-18 ENCOUNTER — Telehealth: Payer: Self-pay | Admitting: Family Medicine

## 2022-05-18 DIAGNOSIS — R768 Other specified abnormal immunological findings in serum: Secondary | ICD-10-CM

## 2022-05-18 DIAGNOSIS — M2550 Pain in unspecified joint: Secondary | ICD-10-CM

## 2022-05-18 NOTE — Telephone Encounter (Signed)
I spoke with Todd Madden about his labs.  ANA is significantly elevated.  Plan to refer to rheumatology.  Will refer to [facility] rheumatology.

## 2022-05-18 NOTE — Telephone Encounter (Signed)
Patient called stating that he received some of his labs in [place] and saw some abnormal levels. He was hoping someone would be able to review these for him? With Dr Tamala Julian out, could Dr Georgina Snell advise on these?

## 2022-05-30 ENCOUNTER — Ambulatory Visit
Admission: RE | Admit: 2022-05-30 | Discharge: 2022-05-30 | Disposition: A | Discharge status: Home / Self Care | Payer: BC Managed Care – PPO | Source: Ambulatory Visit | Attending: Family Medicine | Admitting: Family Medicine

## 2022-05-30 DIAGNOSIS — G5701 Lesion of sciatic nerve, right lower limb: Secondary | ICD-10-CM

## 2022-06-05 ENCOUNTER — Ambulatory Visit: Payer: Self-pay

## 2022-06-05 ENCOUNTER — Ambulatory Visit (INDEPENDENT_AMBULATORY_CARE_PROVIDER_SITE_OTHER): Payer: Self-pay | Admitting: Family Medicine

## 2022-06-05 ENCOUNTER — Encounter: Payer: Self-pay | Admitting: Family Medicine

## 2022-06-05 VITALS — BP 114/84 | Ht 67.0 in | Wt 202.0 lb

## 2022-06-05 DIAGNOSIS — M25551 Pain in right hip: Secondary | ICD-10-CM

## 2022-06-05 DIAGNOSIS — S76011A Strain of muscle, fascia and tendon of right hip, initial encounter: Secondary | ICD-10-CM | POA: Insufficient documentation

## 2022-06-05 NOTE — Patient Instructions (Signed)
No ice or IBU for 3 days Heat and Tylenol are ok See me again in 6 weeks 

## 2022-06-05 NOTE — Progress Notes (Signed)
[place] Phone: [phone] Subjective:    I'm seeing this patient by the request  of:  Olmedo, Guy Begin, MD  CC: Right hip pain  [id number]  Todd Madden is a 61 y.o. male coming in with complaint of R hip pain. Here for PRP. Patient states that his pain in constant. Patient pulled hip flexor over the weekend lifting a heavy cooler.       Past Medical History:  Diagnosis Date   Actinic keratosis    Basal cell carcinoma 04/18/2020   Right post. shoulder. Superficial.    Basal cell carcinoma 08/16/2020   R mid back    Basal cell carcinoma 10/12/2020   right distal lat tricep above elbow, right distal lat tricep above elbow medial - EDC x 2   Basal cell carcinoma 12/05/2020   right ant lat deltoid, edc   Basal cell carcinoma 04/18/2022   R post deltoid, EDC   Skin cancer    unknown type - treated by Dr. Evorn Gong    No past surgical history on file. Social History   Socioeconomic History   Marital status: Married    Spouse name: Not on file   Number of children: Not on file   Years of education: Not on file   Highest education level: Not on file  Occupational History   Not on file  Tobacco Use   Smoking status: Never   Smokeless tobacco: Never  Substance and Sexual Activity   Alcohol use: Yes    Alcohol/week: 6.0 - 8.0 standard drinks of alcohol    Types: 6 - 8 Cans of beer per week    Comment: 6-8 beers weekly   Drug use: Never   Sexual activity: Not on file  Other Topics Concern   Not on file  Social History Narrative   Not on file   Social Determinants of Health   Financial Resource Strain: Not on file  Food Insecurity: Not on file  Transportation Needs: Not on file  Physical Activity: Not on file  Stress: Not on file  Social Connections: Not on file   No Known Allergies No family history on file.   Current Outpatient Medications (Cardiovascular):    atorvastatin  (LIPITOR) 80 MG tablet,    hydrochlorothiazide (HYDRODIURIL) 12.5 MG tablet, Take by mouth.   lisinopril (ZESTRIL) 20 MG tablet,    verapamil (CALAN-SR) 240 MG CR tablet, Take by mouth.  Current Outpatient Medications (Respiratory):    fexofenadine (ALLEGRA) 180 MG tablet, Take by mouth.  Current Outpatient Medications (Analgesics):    allopurinol (ZYLOPRIM) 100 MG tablet, TAKE ONE TABLET BY MOUTH EVERY DAY   Aspirin Buf,CaCarb-MgCarb-MgO, 81 MG TABS, Take by mouth.   colchicine 0.6 MG tablet, TAKE 2 TABS @ ONSET OF FLARE FOLLOWED BY 1 TAB 1 HOUR LATER MAX OF 3 TABS IN 1 attack.  May repeat in 3 days   meloxicam (MOBIC) 15 MG tablet, Take 1 tablet (15 mg total) by mouth daily.   Current Outpatient Medications (Other):    fluorouracil (EFUDEX) 5 % cream, Apply to the scalp BID x 7 days   Vitamin D, Ergocalciferol, (DRISDOL) 1.25 MG (50000 UNIT) CAPS capsule, Take 1 capsule (50,000 Units total) by mouth every 7 (seven) days.   Vitamin D, Ergocalciferol, (DRISDOL) 1.25 MG (50000 UNIT) CAPS capsule, Take 1 capsule (50,000 Units total) by mouth every 7 (seven) days.   cimetidine (TAGAMET) 800 MG tablet, Take 1 tablet (  800 mg total) by mouth at bedtime.   cyclobenzaprine (FLEXERIL) 10 MG tablet, Take 1 tablet (10 mg total) by mouth at bedtime as needed for muscle spasms.   NONFORMULARY OR COMPOUNDED ITEM, See pharmacy note   terbinafine (LAMISIL) 250 MG tablet, Take 1 tablet (250 mg total) by mouth daily.   triamcinolone cream (KENALOG) 0.1 %,     Objective  Blood pressure 114/84, height '5\' 7"'$  (1.702 m), weight 202 lb (91.6 kg), SpO2 97 %.   General: No apparent distress alert and oriented x3 mood and affect normal, dressed appropriately.  HEENT: Pupils equal, extraocular movements intact  Respiratory: Patient's speak in full sentences and does not appear short of breath  Cardiovascular: No lower extremity edema, non tender, no erythema  Mild antalgic gait noted. Right hip exam shows the  patient is minorly tender to palpation over the greater trochanteric area. Procedure: Real-time Ultrasound Guided Injection of right gluteus medius tendon sheath Device: GE Logiq Q7 Ultrasound guided injection is preferred based studies that show increased duration, increased effect, greater accuracy, decreased procedural pain, increased response rate, and decreased cost with ultrasound guided versus blind injection.  Verbal informed consent obtained.  Time-out conducted.  Noted no overlying erythema, induration, or other signs of local infection.  Skin prepped in a sterile fashion.  Local anesthesia: Topical Ethyl chloride.  With sterile technique and under real time ultrasound guidance: With a 21-gauge 2 inch needle injected with 0.5 cc of 0.5% Marcaine and then injected 5 cc of PRP into the tendon sheath from a lateral to medial approach posteriorly Completed without difficulty  Pain immediately resolved suggesting accurate placement of the medication.  Advised to call if fevers/chills, erythema, induration, drainage, or persistent bleeding.  Impression: Technically successful ultrasound guided injection.   Impression and Recommendations:    The above documentation has been reviewed and is accurate and complete Lyndal Pulley, DO

## 2022-06-05 NOTE — Assessment & Plan Note (Signed)
Patient responded well to the PRP today.  Hopefully patient will respond.  I am concerned that the severe spinal stenosis of the lumbar spine could also be playing a role.  May need to consider the possibility of repeating epidurals.  Patient still wants to avoid any surgical intervention.  Post PRP handout given.  Follow-up with me again 6 weeks

## 2022-06-14 NOTE — Progress Notes (Signed)
[place] Phone: [phone] Subjective:   Fontaine No, am serving as a scribe for Dr. Hulan Saas.  I'm seeing this patient by the request  of:  Valera Castle, MD  CC: Buttock and back pain follow-up  [id number]  06/05/2022 Patient responded well to the PRP today.  Hopefully patient will respond.  I am concerned that the severe spinal stenosis of the lumbar spine could also be playing a role.  May need to consider the possibility of repeating epidurals.  Patient still wants to avoid any surgical intervention.  Post PRP handout given.  Follow-up with me again 6 weeks  Updated 06/19/2022 IVA POSTEN is a 61 y.o. male coming in with complaint of R hip and back pain. Patient states that he was doing well but lifted a heavy cooler and his back pain seemed to get worse. Constant pain in lumbar spine. Having hard time sleeping. Numbness in R shin and has soreness in glute to proximal hamstring. R Hip pain has improved. Had labwork done on 05/29/2022.  Previous imaging of lumbar spine shows severe spinal stenosis.  This is at the L4-L5 level and once again seen on the MRI of the pelvis.       Past Medical History:  Diagnosis Date   Actinic keratosis    Basal cell carcinoma 04/18/2020   Right post. shoulder. Superficial.    Basal cell carcinoma 08/16/2020   R mid back    Basal cell carcinoma 10/12/2020   right distal lat tricep above elbow, right distal lat tricep above elbow medial - EDC x 2   Basal cell carcinoma 12/05/2020   right ant lat deltoid, edc   Basal cell carcinoma 04/18/2022   R post deltoid, EDC   Skin cancer    unknown type - treated by Dr. Evorn Gong    No past surgical history on file. Social History   Socioeconomic History   Marital status: Married    Spouse name: Not on file   Number of children: Not on file   Years of education: Not on file   Highest education level: Not on  file  Occupational History   Not on file  Tobacco Use   Smoking status: Never   Smokeless tobacco: Never  Substance and Sexual Activity   Alcohol use: Yes    Alcohol/week: 6.0 - 8.0 standard drinks of alcohol    Types: 6 - 8 Cans of beer per week    Comment: 6-8 beers weekly   Drug use: Never   Sexual activity: Not on file  Other Topics Concern   Not on file  Social History Narrative   Not on file   Social Determinants of Health   Financial Resource Strain: Not on file  Food Insecurity: Not on file  Transportation Needs: Not on file  Physical Activity: Not on file  Stress: Not on file  Social Connections: Not on file   No Known Allergies No family history on file.   Current Outpatient Medications (Cardiovascular):    atorvastatin (LIPITOR) 80 MG tablet,    hydrochlorothiazide (HYDRODIURIL) 12.5 MG tablet, Take by mouth.   lisinopril (ZESTRIL) 20 MG tablet,    verapamil (CALAN-SR) 240 MG CR tablet, Take by mouth.  Current Outpatient Medications (Respiratory):    fexofenadine (ALLEGRA) 180 MG tablet, Take by mouth.  Current Outpatient Medications (Analgesics):    allopurinol (ZYLOPRIM) 100 MG tablet, TAKE ONE TABLET BY MOUTH EVERY DAY  Aspirin Buf,CaCarb-MgCarb-MgO, 81 MG TABS, Take by mouth.   colchicine 0.6 MG tablet, TAKE 2 TABS @ ONSET OF FLARE FOLLOWED BY 1 TAB 1 HOUR LATER MAX OF 3 TABS IN 1 attack.  May repeat in 3 days   meloxicam (MOBIC) 15 MG tablet, Take 1 tablet (15 mg total) by mouth daily.   Current Outpatient Medications (Other):    Vitamin D, Ergocalciferol, (DRISDOL) 1.25 MG (50000 UNIT) CAPS capsule, Take 1 capsule (50,000 Units total) by mouth every 7 (seven) days.   Vitamin D, Ergocalciferol, (DRISDOL) 1.25 MG (50000 UNIT) CAPS capsule, Take 1 capsule (50,000 Units total) by mouth every 7 (seven) days.   cimetidine (TAGAMET) 800 MG tablet, Take 1 tablet (800 mg total) by mouth at bedtime.   cyclobenzaprine (FLEXERIL) 10 MG tablet, Take 1 tablet  (10 mg total) by mouth at bedtime as needed for muscle spasms.   fluorouracil (EFUDEX) 5 % cream, Apply to the scalp BID x 7 days   NONFORMULARY OR COMPOUNDED ITEM, See pharmacy note   terbinafine (LAMISIL) 250 MG tablet, Take 1 tablet (250 mg total) by mouth daily.   triamcinolone cream (KENALOG) 0.1 %,    Reviewed prior external information including notes and imaging from  primary care provider As well as notes that were available from care everywhere and other healthcare systems.  Past medical history, social, surgical and family history all reviewed in electronic medical record.  No pertanent information unless stated regarding to the chief complaint.   Review of Systems:  No headache, visual changes, nausea, vomiting, diarrhea, constipation, dizziness, abdominal pain, skin rash, fevers, chills, night sweats, weight loss, swollen lymph nodes, body aches, joint swelling, chest pain, shortness of breath, mood changes. POSITIVE muscle aches  Objective  Blood pressure 128/76, pulse 90, height '5\' 7"'$  (1.702 m), weight 204 lb (92.5 kg), SpO2 97 %.   General: No apparent distress alert and oriented x3 mood and affect normal, dressed appropriately.  HEENT: Pupils equal, extraocular movements intact  Respiratory: Patient's speak in full sentences and does not appear short of breath  Cardiovascular: No lower extremity edema, non tender, no erythema  Low back exam does have significant loss of lordosis.  Patient does have weakness noted in the left lower extremity compared to the right.  Patient has limited range of motion in the lumbar spine.  Less tenderness over the greater trochanteric area.  Does appear to be neurovascularly intact distally though.  Patient is a highly guarding with any type of motion at the moment.    Impression and Recommendations:      The above documentation has been reviewed and is accurate and complete Lyndal Pulley, DO

## 2022-06-19 ENCOUNTER — Ambulatory Visit: Payer: BC Managed Care – PPO | Admitting: Family Medicine

## 2022-06-19 ENCOUNTER — Encounter: Payer: Self-pay | Admitting: Family Medicine

## 2022-06-19 ENCOUNTER — Ambulatory Visit: Payer: Self-pay

## 2022-06-19 VITALS — BP 128/76 | HR 90 | Ht 67.0 in | Wt 204.0 lb

## 2022-06-19 DIAGNOSIS — S76011D Strain of muscle, fascia and tendon of right hip, subsequent encounter: Secondary | ICD-10-CM | POA: Diagnosis not present

## 2022-06-19 DIAGNOSIS — M5416 Radiculopathy, lumbar region: Secondary | ICD-10-CM | POA: Diagnosis not present

## 2022-06-19 DIAGNOSIS — M25551 Pain in right hip: Secondary | ICD-10-CM

## 2022-06-19 MED ORDER — KETOROLAC TROMETHAMINE 30 MG/ML IJ SOLN
30.0000 mg | Freq: Once | INTRAMUSCULAR | Status: AC
  Administered 2022-06-19: 30 mg via INTRAMUSCULAR

## 2022-06-19 MED ORDER — METHYLPREDNISOLONE ACETATE 80 MG/ML IJ SUSP
80.0000 mg | Freq: Once | INTRAMUSCULAR | Status: AC
  Administered 2022-06-19: 80 mg via INTRAMUSCULAR

## 2022-06-19 NOTE — Patient Instructions (Addendum)
Injections in backside Epi L3/L4 [id number] See Dr. Ellene Route as scheduled See me 6 weeks after injection

## 2022-06-19 NOTE — Assessment & Plan Note (Addendum)
Patient feels like the tendon is significantly better but was having more back pain.  Known to have severe spinal stenosis in the back.  Do believe that that is potentially contributing to more of it.  We have given patient Toradol and Depo-Medrol today and hopeful that patient will make some improvement.  Follow-up with me again in 6 to 8 weeks otherwise.  Encourage epidural as well follow-up with neurosurgery as well

## 2022-06-27 ENCOUNTER — Ambulatory Visit
Admission: RE | Admit: 2022-06-27 | Discharge: 2022-06-27 | Disposition: A | Discharge status: Home / Self Care | Payer: BC Managed Care – PPO | Source: Ambulatory Visit | Attending: Family Medicine | Admitting: Family Medicine

## 2022-06-27 DIAGNOSIS — M5416 Radiculopathy, lumbar region: Secondary | ICD-10-CM

## 2022-06-27 MED ORDER — IOPAMIDOL (ISOVUE-M 200) INJECTION 41%
1.0000 mL | Freq: Once | INTRAMUSCULAR | Status: AC
  Administered 2022-06-27: 1 mL via EPIDURAL

## 2022-06-27 MED ORDER — METHYLPREDNISOLONE ACETATE 40 MG/ML INJ SUSP (RADIOLOG
80.0000 mg | Freq: Once | INTRAMUSCULAR | Status: AC
  Administered 2022-06-27: 80 mg via EPIDURAL

## 2022-06-27 NOTE — Discharge Instructions (Signed)

## 2022-07-16 NOTE — Progress Notes (Unsigned)
[place] Phone: [phone] Subjective:   Fontaine No, am serving as a scribe for Dr. Hulan Saas.  I'm seeing this patient by the request  of:  Valera Castle, MD  CC: right hip pain   [id number]  06/19/2022 Patient feels like the tendon is significantly better but was having more back pain.  Known to have severe spinal stenosis in the back.  Do believe that that is potentially contributing to more of it.  We have given patient Toradol and Depo-Medrol today and hopeful that patient will make some improvement.  Follow-up with me again in 6 to 8 weeks otherwise.  Encourage epidural as well follow-up with neurosurgery as well  Update 07/17/2022 ALRIC GEISE is a 61 y.o. male coming in with complaint of R hip pain. Patient states that PRP and epidural have not been helpful. Pain is getting worse. Pain is mostly in lumbar spine on right side.  We attempted PRP for this gluteal tendon tearing noted previously but states that it has not made any significant improvement.  Patient does feel like it is from his back.  Did bring in outside imaging from April of this year.  This was independently visualized by me and was an MRI of the lumbar showing severe spinal stenosis at the L3-L4 with significant arthritic changes and osteophyte complexes causing nerve root and foraminal stenosis.      Past Medical History:  Diagnosis Date   Actinic keratosis    Basal cell carcinoma 04/18/2020   Right post. shoulder. Superficial.    Basal cell carcinoma 08/16/2020   R mid back    Basal cell carcinoma 10/12/2020   right distal lat tricep above elbow, right distal lat tricep above elbow medial - EDC x 2   Basal cell carcinoma 12/05/2020   right ant lat deltoid, edc   Basal cell carcinoma 04/18/2022   R post deltoid, EDC   Skin cancer    unknown type - treated by Dr. Evorn Gong    No past surgical history on  file. Social History   Socioeconomic History   Marital status: Married    Spouse name: Not on file   Number of children: Not on file   Years of education: Not on file   Highest education level: Not on file  Occupational History   Not on file  Tobacco Use   Smoking status: Never   Smokeless tobacco: Never  Substance and Sexual Activity   Alcohol use: Yes    Alcohol/week: 6.0 - 8.0 standard drinks of alcohol    Types: 6 - 8 Cans of beer per week    Comment: 6-8 beers weekly   Drug use: Never   Sexual activity: Not on file  Other Topics Concern   Not on file  Social History Narrative   Not on file   Social Determinants of Health   Financial Resource Strain: Not on file  Food Insecurity: Not on file  Transportation Needs: Not on file  Physical Activity: Not on file  Stress: Not on file  Social Connections: Not on file   No Known Allergies No family history on file.   Current Outpatient Medications (Cardiovascular):    atorvastatin (LIPITOR) 80 MG tablet,    hydrochlorothiazide (HYDRODIURIL) 12.5 MG tablet, Take by mouth.   lisinopril (ZESTRIL) 20 MG tablet,    verapamil (CALAN-SR) 240 MG CR tablet, Take by mouth.  Current Outpatient Medications (Respiratory):  fexofenadine (ALLEGRA) 180 MG tablet, Take by mouth.  Current Outpatient Medications (Analgesics):    allopurinol (ZYLOPRIM) 100 MG tablet, TAKE ONE TABLET BY MOUTH EVERY DAY   Aspirin Buf,CaCarb-MgCarb-MgO, 81 MG TABS, Take by mouth.   colchicine 0.6 MG tablet, TAKE 2 TABS @ ONSET OF FLARE FOLLOWED BY 1 TAB 1 HOUR LATER MAX OF 3 TABS IN 1 attack.  May repeat in 3 days   meloxicam (MOBIC) 15 MG tablet, Take 1 tablet (15 mg total) by mouth daily.   Current Outpatient Medications (Other):    Vitamin D, Ergocalciferol, (DRISDOL) 1.25 MG (50000 UNIT) CAPS capsule, Take 1 capsule (50,000 Units total) by mouth every 7 (seven) days.   Vitamin D, Ergocalciferol, (DRISDOL) 1.25 MG (50000 UNIT) CAPS capsule, Take 1  capsule (50,000 Units total) by mouth every 7 (seven) days.   cimetidine (TAGAMET) 800 MG tablet, Take 1 tablet (800 mg total) by mouth at bedtime.   cyclobenzaprine (FLEXERIL) 10 MG tablet, Take 1 tablet (10 mg total) by mouth at bedtime as needed for muscle spasms.   fluorouracil (EFUDEX) 5 % cream, Apply to the scalp BID x 7 days   NONFORMULARY OR COMPOUNDED ITEM, See pharmacy note   terbinafine (LAMISIL) 250 MG tablet, Take 1 tablet (250 mg total) by mouth daily.   triamcinolone cream (KENALOG) 0.1 %,    Reviewed prior external information including notes and imaging from  primary care provider As well as notes that were available from care everywhere and other healthcare systems.  Past medical history, social, surgical and family history all reviewed in electronic medical record.  No pertanent information unless stated regarding to the chief complaint.   Review of Systems:  No headache, visual changes, nausea, vomiting, diarrhea, constipation, dizziness, abdominal pain, skin rash, fevers, chills, night sweats, weight loss, swollen lymph nodes, joint swelling, chest pain, shortness of breath, mood changes. POSITIVE muscle aches, body aches  Objective  Blood pressure 128/82, pulse 76, height '5\' 7"'$  (1.702 m), weight 205 lb (93 kg), SpO2 96 %.   General: No apparent distress alert and oriented x3 mood and affect normal, dressed appropriately.  HEENT: Pupils equal, extraocular movements intact  Respiratory: Patient's speak in full sentences and does not appear short of breath  Cardiovascular: No lower extremity edema, non tender, no erythema  Right hip does have mild tenderness noted.  Significant loss of lordosis of the back though noted.  No significant tenderness with straight leg test.  Patient is having tenderness to palpation diffusely in the back with only 5 degrees of extension.  Deep tendon reflexes though are intact.    Impression and Recommendations:    The above  documentation has been reviewed and is accurate and complete Lyndal Pulley, DO

## 2022-07-17 ENCOUNTER — Encounter: Payer: Self-pay | Admitting: Family Medicine

## 2022-07-17 ENCOUNTER — Ambulatory Visit: Payer: BC Managed Care – PPO | Admitting: Family Medicine

## 2022-07-17 ENCOUNTER — Ambulatory Visit: Payer: Self-pay

## 2022-07-17 VITALS — BP 128/82 | HR 76 | Ht 67.0 in | Wt 205.0 lb

## 2022-07-17 DIAGNOSIS — S76011D Strain of muscle, fascia and tendon of right hip, subsequent encounter: Secondary | ICD-10-CM | POA: Diagnosis not present

## 2022-07-17 DIAGNOSIS — M25551 Pain in right hip: Secondary | ICD-10-CM

## 2022-07-17 DIAGNOSIS — M48061 Spinal stenosis, lumbar region without neurogenic claudication: Secondary | ICD-10-CM | POA: Diagnosis not present

## 2022-07-17 NOTE — Patient Instructions (Signed)
Dr. Trix[facility]

## 2022-07-17 NOTE — Assessment & Plan Note (Signed)
I do believe when reviewing patient's imaging including the CT that he brought today as well as the MRI of the pelvis that should be in epic patient has severe spinal stenosis is what is concerning especially with the osteophyte complex at the L3-L4 and L4-L5 area is causing nerve root compression.  Patient has failed everything including formal physical therapy, osteopathic manipulation, cyclobenzaprine, meloxicam, epidurals and nerve root injections from outside source as well as facet injections.  Patient's pain is worsening and it is affecting daily activities.  Do believe that possibly surgical or intervention is warranted at this time and will refer patient to Dr. Lynann Bologna to discuss further possible intervention

## 2022-07-17 NOTE — Assessment & Plan Note (Signed)
Did not respond to the PRP, likely weakness is secondary to the nerve impingement noted at the L3-L4 and L4-L5 severe spinal stenosis

## 2022-07-23 ENCOUNTER — Encounter: Payer: Self-pay | Admitting: Family Medicine

## 2022-07-26 ENCOUNTER — Other Ambulatory Visit: Payer: Self-pay | Admitting: Orthopedic Surgery

## 2022-07-26 DIAGNOSIS — M545 Low back pain, unspecified: Secondary | ICD-10-CM

## 2022-08-20 ENCOUNTER — Encounter: Payer: Self-pay | Admitting: Family Medicine

## 2022-08-22 ENCOUNTER — Ambulatory Visit
Admission: RE | Admit: 2022-08-22 | Discharge: 2022-08-22 | Disposition: A | Discharge status: Home / Self Care | Payer: BC Managed Care – PPO | Source: Ambulatory Visit | Attending: Orthopedic Surgery | Admitting: Orthopedic Surgery

## 2022-08-22 DIAGNOSIS — G8929 Other chronic pain: Secondary | ICD-10-CM

## 2022-08-27 ENCOUNTER — Other Ambulatory Visit: Payer: Self-pay | Admitting: Orthopedic Surgery

## 2022-08-27 DIAGNOSIS — G8929 Other chronic pain: Secondary | ICD-10-CM

## 2022-08-28 NOTE — Telephone Encounter (Signed)
Patient scheduled for SI jt injection tomorrow, 08/29/2022.

## 2022-08-29 ENCOUNTER — Encounter: Payer: Self-pay | Admitting: Family Medicine

## 2022-08-29 ENCOUNTER — Ambulatory Visit: Payer: Self-pay

## 2022-08-29 ENCOUNTER — Ambulatory Visit: Payer: BC Managed Care – PPO | Admitting: Family Medicine

## 2022-08-29 VITALS — BP 130/80 | HR 51 | Ht 67.0 in | Wt 205.0 lb

## 2022-08-29 DIAGNOSIS — M25551 Pain in right hip: Secondary | ICD-10-CM

## 2022-08-29 DIAGNOSIS — M47818 Spondylosis without myelopathy or radiculopathy, sacral and sacrococcygeal region: Secondary | ICD-10-CM | POA: Diagnosis not present

## 2022-08-29 DIAGNOSIS — M461 Sacroiliitis, not elsewhere classified: Secondary | ICD-10-CM | POA: Insufficient documentation

## 2022-08-29 NOTE — Patient Instructions (Addendum)
Injected SI joint Stay active Send update in 2 weeks and we will discuss plan from there

## 2022-08-29 NOTE — Assessment & Plan Note (Signed)
Patient given injection and tolerated the procedure well.  Discussed icing regimen and home exercises, we discussed that if this helps.  He may not need to get an injection.  Still feels the lumbar spinal stenosis is contributing as well.  Patient can follow-up with me again in 6 to 8 weeks if needed.  Patient will follow-up with the orthopedic surgeon as well.

## 2022-08-29 NOTE — Progress Notes (Signed)
[place] [facility] [place] Phone: [phone] Subjective:    I'm seeing this patient by the request  of:  Olmedo, Guy Begin, MD  CC: Right hip pain and back pain  [id number]    Todd Madden is a 61 y.o. male coming in with complaint of right hip pain follow up. Today patient states is wanting an SI injection to get him through till the January appointment with [facility] imaging.  Patient is wondering if he can get an injection of TriVisc written and see how he responds.  Considering the radiofrequency ablation as well.       Past Medical History:  Diagnosis Date   Actinic keratosis    Basal cell carcinoma 04/18/2020   Right post. shoulder. Superficial.    Basal cell carcinoma 08/16/2020   R mid back    Basal cell carcinoma 10/12/2020   right distal lat tricep above elbow, right distal lat tricep above elbow medial - EDC x 2   Basal cell carcinoma 12/05/2020   right ant lat deltoid, edc   Basal cell carcinoma 04/18/2022   R post deltoid, EDC   Skin cancer    unknown type - treated by Dr. Evorn Gong    History reviewed. No pertinent surgical history. Social History   Socioeconomic History   Marital status: Married    Spouse name: Not on file   Number of children: Not on file   Years of education: Not on file   Highest education level: Not on file  Occupational History   Not on file  Tobacco Use   Smoking status: Never   Smokeless tobacco: Never  Substance and Sexual Activity   Alcohol use: Yes    Alcohol/week: 6.0 - 8.0 standard drinks of alcohol    Types: 6 - 8 Cans of beer per week    Comment: 6-8 beers weekly   Drug use: Never   Sexual activity: Not on file  Other Topics Concern   Not on file  Social History Narrative   Not on file   Social Determinants of Health   Financial Resource Strain: Not on file  Food Insecurity: Not on file  Transportation Needs: Not on file  Physical Activity: Not  on file  Stress: Not on file  Social Connections: Not on file   No Known Allergies History reviewed. No pertinent family history.   Current Outpatient Medications (Cardiovascular):    atorvastatin (LIPITOR) 80 MG tablet,    hydrochlorothiazide (HYDRODIURIL) 12.5 MG tablet, Take by mouth.   lisinopril (ZESTRIL) 20 MG tablet,    verapamil (CALAN-SR) 240 MG CR tablet, Take by mouth.  Current Outpatient Medications (Respiratory):    fexofenadine (ALLEGRA) 180 MG tablet, Take by mouth.  Current Outpatient Medications (Analgesics):    allopurinol (ZYLOPRIM) 100 MG tablet, TAKE ONE TABLET BY MOUTH EVERY DAY   Aspirin Buf,CaCarb-MgCarb-MgO, 81 MG TABS, Take by mouth.   colchicine 0.6 MG tablet, TAKE 2 TABS @ ONSET OF FLARE FOLLOWED BY 1 TAB 1 HOUR LATER MAX OF 3 TABS IN 1 attack.  May repeat in 3 days   Current Outpatient Medications (Other):    Vitamin D, Ergocalciferol, (DRISDOL) 1.25 MG (50000 UNIT) CAPS capsule, Take 1 capsule (50,000 Units total) by mouth every 7 (seven) days.   Reviewed prior external information including notes and imaging from  primary care provider As well as notes that were available from care everywhere and other healthcare systems.  Past medical history, social,  surgical and family history all reviewed in electronic medical record.  No pertanent information unless stated regarding to the chief complaint.   Review of Systems:  No headache, visual changes, nausea, vomiting, diarrhea, constipation, dizziness, abdominal pain, skin rash, fevers, chills, night sweats, weight loss, swollen lymph nodes, body aches, joint swelling, chest pain, shortness of breath, mood changes. POSITIVE muscle aches  Objective  Blood pressure 130/80, pulse (!) 51, height '5\' 7"'$  (1.702 m), weight 205 lb (93 kg), SpO2 99 %.   General: No apparent distress alert and oriented x3 mood and affect normal, dressed appropriately.  HEENT: Pupils equal, extraocular movements intact   Respiratory: Patient's speak in full sentences and does not appear short of breath  Cardiovascular: No lower extremity edema, non tender, no erythema  Right hip exam does have tenderness over the right sacroiliac joint.  Intact some tightness of the FABER test noted as well.  Procedure: Real-time Ultrasound Guided Injection of right sacroiliac joint Device: GE Logiq Q7 Ultrasound guided injection is preferred based studies that show increased duration, increased effect, greater accuracy, decreased procedural pain, increased response rate, and decreased cost with ultrasound guided versus blind injection.  Verbal informed consent obtained.  Time-out conducted.  Noted no overlying erythema, induration, or other signs of local infection.  Skin prepped in a sterile fashion.  Local anesthesia: Topical Ethyl chloride.  With sterile technique and under real time ultrasound guidance: Patient with a 20-gauge 2 inch needle was injected with 1 cc of 0.5% Marcaine and 1 cc of Kenalog 40 mg/mL. Completed without difficulty  Pain immediately resolved suggesting accurate placement of the medication.  Advised to call if fevers/chills, erythema, induration, drainage, or persistent bleeding.  Impression: Technically successful ultrasound guided injection.    Impression and Recommendations:    The above documentation has been reviewed and is accurate and complete Lyndal Pulley, DO

## 2022-09-06 ENCOUNTER — Ambulatory Visit (INDEPENDENT_AMBULATORY_CARE_PROVIDER_SITE_OTHER): Payer: BC Managed Care – PPO

## 2022-09-06 DIAGNOSIS — M5416 Radiculopathy, lumbar region: Secondary | ICD-10-CM | POA: Diagnosis not present

## 2022-09-06 DIAGNOSIS — M48061 Spinal stenosis, lumbar region without neurogenic claudication: Secondary | ICD-10-CM

## 2022-09-06 MED ORDER — KETOROLAC TROMETHAMINE 60 MG/2ML IM SOLN
60.0000 mg | Freq: Once | INTRAMUSCULAR | Status: AC
  Administered 2022-09-06: 60 mg via INTRAMUSCULAR

## 2022-09-06 MED ORDER — METHYLPREDNISOLONE ACETATE 80 MG/ML IJ SUSP
80.0000 mg | Freq: Once | INTRAMUSCULAR | Status: AC
  Administered 2022-09-06: 80 mg via INTRAMUSCULAR

## 2022-09-06 NOTE — Progress Notes (Signed)
Patient given depo '80mg'$  and Toradol '60mg'$  per Dr. Tamala Julian. Patient given these injection in the upper outer buttock on both sides. Patient tolerated injections well. Patient stated no allergies to Ibuprofen.

## 2022-09-28 ENCOUNTER — Ambulatory Visit
Admission: RE | Admit: 2022-09-28 | Discharge: 2022-09-28 | Disposition: A | Discharge status: Home / Self Care | Payer: BC Managed Care – PPO | Source: Ambulatory Visit | Attending: Orthopedic Surgery | Admitting: Orthopedic Surgery

## 2022-09-28 DIAGNOSIS — G8929 Other chronic pain: Secondary | ICD-10-CM

## 2022-09-28 MED ORDER — METHYLPREDNISOLONE ACETATE 80 MG/ML IJ SUSP: 80.0000 mg | Freq: Once | INTRAMUSCULAR | Status: DC

## 2022-10-22 ENCOUNTER — Ambulatory Visit: Payer: BC Managed Care – PPO | Admitting: Dermatology

## 2022-10-22 ENCOUNTER — Encounter: Payer: Self-pay | Admitting: Dermatology

## 2022-10-22 VITALS — BP 142/78 | HR 80

## 2022-10-22 DIAGNOSIS — Z1283 Encounter for screening for malignant neoplasm of skin: Secondary | ICD-10-CM

## 2022-10-22 DIAGNOSIS — D489 Neoplasm of uncertain behavior, unspecified: Secondary | ICD-10-CM

## 2022-10-22 DIAGNOSIS — Z79899 Other long term (current) drug therapy: Secondary | ICD-10-CM | POA: Diagnosis not present

## 2022-10-22 DIAGNOSIS — C44612 Basal cell carcinoma of skin of right upper limb, including shoulder: Secondary | ICD-10-CM

## 2022-10-22 DIAGNOSIS — L570 Actinic keratosis: Secondary | ICD-10-CM

## 2022-10-22 DIAGNOSIS — Z85828 Personal history of other malignant neoplasm of skin: Secondary | ICD-10-CM

## 2022-10-22 DIAGNOSIS — Z5111 Encounter for antineoplastic chemotherapy: Secondary | ICD-10-CM

## 2022-10-22 DIAGNOSIS — L814 Other melanin hyperpigmentation: Secondary | ICD-10-CM

## 2022-10-22 DIAGNOSIS — D229 Melanocytic nevi, unspecified: Secondary | ICD-10-CM

## 2022-10-22 DIAGNOSIS — L578 Other skin changes due to chronic exposure to nonionizing radiation: Secondary | ICD-10-CM

## 2022-10-22 NOTE — Patient Instructions (Addendum)
Electrodesiccation and Curettage ("Scrape and Burn") Wound Care Instructions  Leave the original bandage on for 24 hours if possible.  If the bandage becomes soaked or soiled before that time, it is OK to remove it and examine the wound.  A small amount of post-operative bleeding is normal.  If excessive bleeding occurs, remove the bandage, place gauze over the site and apply continuous pressure (no peeking) over the area for 30 minutes. If this does not work, please call our clinic as soon as possible or page your doctor if it is after hours.   Once a day, cleanse the wound with soap and water. It is fine to shower. If a thick crust develops you may use a Q-tip dipped into dilute hydrogen peroxide (mix 1:1 with water) to dissolve it.  Hydrogen peroxide can slow the healing process, so use it only as needed.    After washing, apply petroleum jelly (Vaseline) or an antibiotic ointment if your doctor prescribed one for you, followed by a bandage.    For best healing, the wound should be covered with a layer of ointment at all times. If you are not able to keep the area covered with a bandage to hold the ointment in place, this may mean re-applying the ointment several times a day.  Continue this wound care until the wound has healed and is no longer open. It may take several weeks for the wound to heal and close.  Itching and mild discomfort is normal during the healing process.  If you have any discomfort, you can take Tylenol (acetaminophen) or ibuprofen as directed on the bottle. (Please do not take these if you have an allergy to them or cannot take them for another reason).  Some redness, tenderness and white or yellow material in the wound is normal healing.  If the area becomes very sore and red, or develops a thick yellow-green material (pus), it may be infected; please notify us.    Wound healing continues for up to one year following surgery. It is not unusual to experience pain in the scar  from time to time during the interval.  If the pain becomes severe or the scar thickens, you should notify the office.    A slight amount of redness in a scar is expected for the first six months.  After six months, the redness will fade and the scar will soften and fade.  The color difference becomes less noticeable with time.  If there are any problems, return for a post-op surgery check at your earliest convenience.  To improve the appearance of the scar, you can use silicone scar gel, cream, or sheets (such as Mederma or Serica) every night for up to one year. These are available over the counter (without a prescription).  Please call our office at [phone] for any questions or concerns.   Biopsy Wound Care Instructions  Leave the original bandage on for 24 hours if possible.  If the bandage becomes soaked or soiled before that time, it is OK to remove it and examine the wound.  A small amount of post-operative bleeding is normal.  If excessive bleeding occurs, remove the bandage, place gauze over the site and apply continuous pressure (no peeking) over the area for 30 minutes. If this does not work, please call our clinic as soon as possible or page your doctor if it is after hours.   Once a day, cleanse the wound with soap and water. It is fine to shower.  If a thick crust develops you may use a Q-tip dipped into dilute hydrogen peroxide (mix 1:1 with water) to dissolve it.  Hydrogen peroxide can slow the healing process, so use it only as needed.    After washing, apply petroleum jelly (Vaseline) or an antibiotic ointment if your doctor prescribed one for you, followed by a bandage.    For best healing, the wound should be covered with a layer of ointment at all times. If you are not able to keep the area covered with a bandage to hold the ointment in place, this may mean re-applying the ointment several times a day.  Continue this wound care until the wound has healed and is no longer  open.   Itching and mild discomfort is normal during the healing process. However, if you develop pain or severe itching, please call our office.   If you have any discomfort, you can take Tylenol (acetaminophen) or ibuprofen as directed on the bottle. (Please do not take these if you have an allergy to them or cannot take them for another reason).  Some redness, tenderness and white or yellow material in the wound is normal healing.  If the area becomes very sore and red, or develops a thick yellow-green material (pus), it may be infected; please notify us.    If you have stitches, return to clinic as directed to have the stitches removed. You will continue wound care for 2-3 days after the stitches are removed.   Wound healing continues for up to one year following surgery. It is not unusual to experience pain in the scar from time to time during the interval.  If the pain becomes severe or the scar thickens, you should notify the office.    A slight amount of redness in a scar is expected for the first six months.  After six months, the redness will fade and the scar will soften and fade.  The color difference becomes less noticeable with time.  If there are any problems, return for a post-op surgery check at your earliest convenience.  To improve the appearance of the scar, you can use silicone scar gel, cream, or sheets (such as Mederma or Serica) every night for up to one year. These are available over the counter (without a prescription).  Please call our office at [phone] for any questions or concerns.   Restart in 1 month   - Start 5-fluorouracil/calcipotriene cream twice a day for 7 days to affected areas including scalp. Reviewed course of treatment and expected reaction.  Patient advised to expect inflammation and crusting and advised that erosions are possible.  Patient advised to be diligent with sun protection during and after treatment. Counseled to keep medication out of  reach of children and pets.   5-Fluorouracil/Calcipotriene Patient Education   Actinic keratoses are the dry, red scaly spots on the skin caused by sun damage. A portion of these spots can turn into skin cancer with time, and treating them can help prevent development of skin cancer.   Treatment of these spots requires removal of the defective skin cells. There are various ways to remove actinic keratoses, including freezing with liquid nitrogen, treatment with creams, or treatment with a blue light procedure in the office.   5-fluorouracil cream is a topical cream used to treat actinic keratoses. It works by interfering with the growth of abnormal fast-growing skin cells, such as actinic keratoses. These cells peel off and are replaced by healthy ones.   5-fluorouracil/calcipotriene  is a combination of the 5-fluorouracil cream with a vitamin D analog cream called calcipotriene. The calcipotriene alone does not treat actinic keratoses. However, when it is combined with 5-fluorouracil, it helps the 5-fluorouracil treat the actinic keratoses much faster so that the same results can be achieved with a much shorter treatment time.  INSTRUCTIONS FOR 5-FLUOROURACIL/CALCIPOTRIENE CREAM:   5-fluorouracil/calcipotriene cream typically only needs to be used for 4-7 days. A thin layer should be applied twice a day to the treatment areas recommended by your physician.   If your physician prescribed you separate tubes of 5-fluourouracil and calcipotriene, apply a thin layer of 5-fluorouracil followed by a thin layer of calcipotriene.   Avoid contact with your eyes, nostrils, and mouth. Do not use 5-fluorouracil/calcipotriene cream on infected or open wounds.   You will develop redness, irritation and some crusting at areas where you have pre-cancer damage/actinic keratoses. IF YOU DEVELOP PAIN, BLEEDING, OR SIGNIFICANT CRUSTING, STOP THE TREATMENT EARLY - you have already gotten a good response and the  actinic keratoses should clear up well.  Wash your hands after applying 5-fluorouracil 5% cream on your skin.   A moisturizer or sunscreen with a minimum SPF 30 should be applied each morning.   Once you have finished the treatment, you can apply a thin layer of Vaseline twice a day to irritated areas to soothe and calm the areas more quickly. If you experience significant discomfort, contact your physician.  For some patients it is necessary to repeat the treatment for best results.  SIDE EFFECTS: When using 5-fluorouracil/calcipotriene cream, you may have mild irritation, such as redness, dryness, swelling, or a mild burning sensation. This usually resolves within 2 weeks. The more actinic keratoses you have, the more redness and inflammation you can expect during treatment. Eye irritation has been reported rarely. If this occurs, please let us know.  If you have any trouble using this cream, please call the office. If you have any other questions about this information, please do not hesitate to ask me before you leave the office.      Due to recent changes in healthcare laws, you may see results of your pathology and/or laboratory studies on MyChart before the doctors have had a chance to review them. We understand that in some cases there may be results that are confusing or concerning to you. Please understand that not all results are received at the same time and often the doctors may need to interpret multiple results in order to provide you with the best plan of care or course of treatment. Therefore, we ask that you please give [place] 2 business days to thoroughly review all your results before contacting the office for clarification. Should we see a critical lab result, you will be contacted sooner.   If You Need Anything After Your Visit  If you have any questions or concerns for your doctor, please call our main line at [phone] and press option 4 to reach your doctor's medical  assistant. If no one answers, please leave a voicemail as directed and we will return your call as soon as possible. Messages left after 4 pm will be answered the following business day.   You may also send [place] a message via [place]. We typically respond to MyChart messages within 1-2 business days.  For prescription refills, please ask your pharmacy to contact our office. Our fax number is [phone].  If you have an urgent issue when the clinic is closed that cannot wait until  the next business day, you can page your doctor at the number below.    Please note that while we do our best to be available for urgent issues outside of office hours, we are not available 24/7.   If you have an urgent issue and are unable to reach [place], you may choose to seek medical care at your doctor's office, retail clinic, urgent care center, or emergency room.  If you have a medical emergency, please immediately call 911 or go to the emergency department.  Pager Numbers  - Dr. Nehemiah Massed: [phone]  - Dr. Laurence Ferrari: [phone]  - Dr. Nicole Kindred: [phone]  In the event of inclement weather, please call our main line at [phone] for an update on the status of any delays or closures.  Dermatology Medication Tips: Please keep the boxes that topical medications come in in order to help keep track of the instructions about where and how to use these. Pharmacies typically print the medication instructions only on the boxes and not directly on the medication tubes.   If your medication is too expensive, please contact our office at [phone] option 4 or send [place] a message through [place].   We are unable to tell what your co-pay for medications will be in advance as this is different depending on your insurance coverage. However, we may be able to find a substitute medication at lower cost or fill out paperwork to get insurance to cover a needed medication.   If a prior authorization is required to get your  medication covered by your insurance company, please allow [place] 1-2 business days to complete this process.  Drug prices often vary depending on where the prescription is filled and some pharmacies may offer cheaper prices.  The website www.goodrx.com contains coupons for medications through different pharmacies. The prices here do not account for what the cost may be with help from insurance (it may be cheaper with your insurance), but the website can give you the price if you did not use any insurance.  - You can print the associated coupon and take it with your prescription to the pharmacy.  - You may also stop by our office during regular business hours and pick up a GoodRx coupon card.  - If you need your prescription sent electronically to a different pharmacy, notify our office through [facility] or by phone at [phone] option 4.     Si Usted Necesita Algo Despus de Su Visita  Tambin puede enviarnos un mensaje a travs de Pharmacist, community. Por lo general respondemos a los mensajes de MyChart en el transcurso de 1 a 2 das hbiles.  Para renovar recetas, por favor pida a su farmacia que se ponga en contacto con nuestra oficina. Harland Dingwall de fax es [place] [phone].  Si tiene un asunto urgente cuando la clnica est cerrada y que no puede esperar hasta el siguiente da hbil, puede llamar/localizar a su doctor(a) al nmero que aparece a continuacin.   Por favor, tenga en cuenta que aunque hacemos todo lo posible para estar disponibles para asuntos urgentes fuera del horario de [place], no estamos disponibles las 24 horas del da, los 7 das de la [place].   Si tiene un problema urgente y no puede comunicarse con nosotros, puede optar por buscar atencin mdica  en el consultorio de su doctor(a), en una clnica privada, en un centro de atencin urgente o en una sala de emergencias.  Si tiene una emergencia mdica, por favor llame inmediatamente al 911  o vaya a la sala de  emergencias.  Nmeros de bper  - Dr. Nehemiah Massed: [phone]  - Dra. Moye: [phone]  - Dra. Nicole Kindred: [phone]  En caso de inclemencias del [place], por favor llame a Johnsie Kindred principal al [phone] para una actualizacin sobre el [place] de cualquier retraso o cierre.  Consejos para la medicacin en dermatologa: Por favor, guarde las cajas en las que vienen los medicamentos de uso tpico para ayudarle a seguir las instrucciones sobre dnde y cmo usarlos. Las farmacias generalmente imprimen las instrucciones del medicamento slo en las cajas y no directamente en los tubos del [place].   Si su medicamento es muy caro, por favor, pngase en contacto con Zigmund Daniel llamando al [phone] y presione la opcin 4 o envenos un mensaje a travs de Pharmacist, community.   No podemos decirle cul ser su copago por los medicamentos por adelantado ya que esto es diferente dependiendo de la cobertura de su seguro. Sin embargo, es posible que podamos encontrar un medicamento sustituto a Electrical engineer un formulario para que el seguro cubra el medicamento que se considera necesario.   Si se requiere una autorizacin previa para que su compaa de seguros [place] su medicamento, por favor permtanos de 1 a 2 das hbiles para completar este proceso.  Los precios de los medicamentos varan con frecuencia dependiendo del Environmental consultant de dnde se surte la receta y alguna farmacias pueden ofrecer precios ms baratos.  El sitio web www.goodrx.com tiene cupones para medicamentos de Airline pilot. Los precios aqu no tienen en cuenta lo que podra costar con la ayuda del seguro (puede ser ms barato con su seguro), pero el sitio web puede darle el precio si no utiliz Research scientist (physical sciences).  - Puede imprimir el cupn correspondiente y llevarlo con su receta a la farmacia.  - Tambin puede pasar por nuestra oficina durante el horario de atencin regular y Charity fundraiser una tarjeta de cupones de GoodRx.  - Si  necesita que su receta se enve electrnicamente a una farmacia diferente, informe a nuestra oficina a travs de MyChart de [place] o por telfono llamando al [phone] y presione la opcin 4.

## 2022-10-22 NOTE — Progress Notes (Signed)
Follow-Up Visit   Subjective  Todd Madden is a 62 y.o. male who presents for the following: Actinic Keratosis (Hx of aks at scalp, use 5 f/u cream to scalp got a good reaction. ) and Other (Recheck bx still reports some redness.). The patient has spots, moles and lesions to be evaluated, some may be new or changing and the patient has concerns that these could be cancer.   The following portions of the chart were reviewed this encounter and updated as appropriate:  Tobacco  Allergies  Meds  Problems  Med Hx  Surg Hx  Fam Hx     Review of Systems: No other skin or systemic complaints except as noted in HPI or Assessment and Plan.  Objective  Well appearing patient in no apparent distress; mood and affect are within normal limits.  A focused examination was performed including face, scalp, b/l ears, right arm, left arm. Relevant physical exam findings are noted in the Assessment and Plan.  scalp x 4, left ear x 2, right ear x 1 (7) Erythematous thin papules/macules with gritty scale.   right lateral deltoid 1.1 cm pink patch         Assessment & Plan  AK (actinic keratosis) (7) scalp x 4, left ear x 2, right ear x 1  Repeat 5 f/u cream at scalp in 1 month  Actinic keratoses are precancerous spots that appear secondary to cumulative UV radiation exposure/sun exposure over time. They are chronic with expected duration over 1 year. A portion of actinic keratoses will progress to squamous cell carcinoma of the skin. It is not possible to reliably predict which spots will progress to skin cancer and so treatment is recommended to prevent development of skin cancer.  Recommend daily broad spectrum sunscreen SPF 30+ to sun-exposed areas, reapply every 2 hours as needed.  Recommend staying in the shade or wearing long sleeves, sun glasses (UVA+UVB protection) and wide brim hats (4-inch brim around the entire circumference of the hat). Call for new or changing  lesions.  Destruction of lesion - scalp x 4, left ear x 2, right ear x 1 Complexity: simple   Destruction method: cryotherapy   Informed consent: discussed and consent obtained   Timeout:  patient name, date of birth, surgical site, and procedure verified Lesion destroyed using liquid nitrogen: Yes   Region frozen until ice ball extended beyond lesion: Yes   Outcome: patient tolerated procedure well with no complications   Post-procedure details: wound care instructions given   Additional details:  Prior to procedure, discussed risks of blister formation, small wound, skin dyspigmentation, or rare scar following cryotherapy. Recommend Vaseline ointment to treated areas while healing.   Neoplasm of uncertain behavior right lateral deltoid Epidermal / dermal shaving  Lesion diameter (cm):  1.1 Informed consent: discussed and consent obtained   Timeout: patient name, date of birth, surgical site, and procedure verified   Procedure prep:  Patient was prepped and draped in usual sterile fashion Prep type:  Isopropyl alcohol Anesthesia: the lesion was anesthetized in a standard fashion   Anesthetic:  1% lidocaine w/ epinephrine 1-100,000 buffered w/ 8.4% NaHCO3 Instrument used: flexible razor blade   Hemostasis achieved with: pressure, aluminum chloride and electrodesiccation   Outcome: patient tolerated procedure well   Post-procedure details: sterile dressing applied and wound care instructions given   Dressing type: bandage and petrolatum    Destruction of lesion Complexity: extensive  Destruction method: EDC   Informed consent: discussed and consent  obtained   Timeout:  patient name, date of birth, surgical site, and procedure verified Lesion destroyed using liquid nitrogen: Yes   Region frozen until ice ball extended beyond lesion: Yes   Lesion length (cm):  1.1 Lesion width (cm):  1.1 Margin per side (cm):  0.2 Final wound size (cm):  1.5 Outcome: patient tolerated procedure  well with no complications   Post-procedure details: wound care instructions given    Specimen 1 - Surgical pathology Differential Diagnosis: r/o bcc Check Margins: No R/o bcc  Actinic Damage with PreCancerous Actinic Keratoses Counseling for Topical Chemotherapy Management: Patient exhibits: - Severe, confluent actinic changes with pre-cancerous actinic keratoses that is secondary to cumulative UV radiation exposure over time - Condition that is severe; chronic, not at goal. - diffuse scaly erythematous macules and papules with underlying dyspigmentation - Discussed Prescription "Field Treatment" topical Chemotherapy for Severe, Chronic Confluent Actinic Changes with Pre-Cancerous Actinic Keratoses Field treatment involves treatment of an entire area of skin that has confluent Actinic Changes (Sun/ Ultraviolet light damage) and PreCancerous Actinic Keratoses by method of PhotoDynamic Therapy (PDT) and/or prescription Topical Chemotherapy agents such as 5-fluorouracil, 5-fluorouracil/calcipotriene, and/or imiquimod.  The purpose is to decrease the number of clinically evident and subclinical PreCancerous lesions to prevent progression to development of skin cancer by chemically destroying early precancer changes that may or may not be visible.  It has been shown to reduce the risk of developing skin cancer in the treated area. As a result of treatment, redness, scaling, crusting, and open sores may occur during treatment course. One or more than one of these methods may be used and may have to be used several times to control, suppress and eliminate the PreCancerous changes. Discussed treatment course, expected reaction, and possible side effects. - Recommend daily broad spectrum sunscreen SPF 30+ to sun-exposed areas, reapply every 2 hours as needed.  - Staying in the shade or wearing long sleeves, sun glasses (UVA+UVB protection) and wide brim hats (4-inch brim around the entire circumference of  the hat) are also recommended. - Call for new or changing lesions.  Restart in 1 month  5-fluorouracil/calcipotriene cream twice a day for 7 days to affected areas including scalp. Prescription sent to [facility]. Patient advised they will receive an email to purchase the medication online and have it sent to their home. Patient provided with handout reviewing treatment course and side effects and advised to call or message [place] on MyChart with any concerns.  Reviewed course of treatment and expected reaction.  Patient advised to expect inflammation and crusting and advised that erosions are possible.  Patient advised to be diligent with sun protection during and after treatment. Counseled to keep medication out of reach of children and pets.  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  - Call if any new or changing lesions are noted between office visits  Return in about 6 months (around 04/22/2023) for ak followup.  IRuthell Rummage, CMA, am acting as scribe for Sarina Ser, MD. Documentation: I have reviewed the above documentation for accuracy and completeness, and I agree with the above.  Sarina Ser, MD

## 2022-10-23 ENCOUNTER — Encounter: Payer: Self-pay | Admitting: Dermatology

## 2022-10-30 ENCOUNTER — Telehealth: Payer: Self-pay

## 2022-10-30 DIAGNOSIS — C4491 Basal cell carcinoma of skin, unspecified: Secondary | ICD-10-CM

## 2022-10-30 HISTORY — DX: Basal cell carcinoma of skin, unspecified: C44.91

## 2022-10-30 NOTE — Telephone Encounter (Signed)
Patient informed of pathology results 

## 2022-10-30 NOTE — Telephone Encounter (Signed)
-----   Message from Brendolyn Patty, MD sent at 10/30/2022 12:10 PM EST ----- Skin , right lateral deltoid BASAL CELL CARCINOMA, SUPERFICIAL AND NODULAR PATTERNS  BCC skin cancer- already treated with EDC at time of biopsy   - please call patient

## 2022-11-16 ENCOUNTER — Encounter: Payer: Self-pay | Admitting: Family Medicine

## 2022-11-26 DIAGNOSIS — R7689 Other specified abnormal immunological findings in serum: Secondary | ICD-10-CM | POA: Insufficient documentation

## 2023-02-25 ENCOUNTER — Encounter: Payer: Self-pay | Admitting: Neurosurgery

## 2023-03-12 DIAGNOSIS — R7303 Prediabetes: Secondary | ICD-10-CM | POA: Insufficient documentation

## 2023-03-15 NOTE — Progress Notes (Addendum)
Referring Physician:  Cyndia Diver, PA-C [address] [place],  [place] [address]  Primary Physician:  Judi Saa, DO  History of Present Illness: 03/19/23 [name] is a 62 year old with a history of OSA, hypertension, hyperlipidemia, gout, prediabetes, and chronic lumbosacral complaints who is here today with a chief complaint of persistent right-sided low back pain.  He states that this started about a year and a half ago after he had his left knee replaced and has persisted since then.  He has undergone multiple conservative interventions including physical therapy at [facility], medication management, and multiple epidural steroid injections with minimal relief.  Today he describes pain that starts in his right low back.  This seems to be worse with standing for prolonged periods of time and walking and improved some with laying down.  He does have some mild numbness in his right anterior shin but denies any radiating leg pain.  He denies any left-sided symptoms.  He was supposed to undergo RFA's with Dr. Renaldo Reel however he has postpone these as he is currently going through acupuncture although this is not helping.  Conservative measures: Acupuncture  Physical therapy:  has participated at [facility] in June 2023 without relief Multimodal medical therapy including regular antiinflammatories:  advil, tylenol, tizanidine Injections:  has received epidural steroid injections 01/30/23: Right L3-4, L4-5 and L5-S1 facet - helped for a few hours the day of 4 interlaminar epidural steroid injections at L4-5 - no relief  Past Surgery: 11/20/22: Right SI fusion by Dr Yevette Edwards - did not help  [name] has no symptoms of cervical myelopathy.  The symptoms are causing a significant impact on the patient's life.   Review of Systems:  A 10 point review of systems is negative, except for the pertinent positives and negatives detailed in the HPI.  Past Medical History: Past  Medical History:  Diagnosis Date   Actinic keratosis    Basal cell carcinoma 04/18/2020   Right post. shoulder. Superficial.    Basal cell carcinoma 08/16/2020   R mid back    Basal cell carcinoma 10/12/2020   right distal lat tricep above elbow, right distal lat tricep above elbow medial - EDC x 2   Basal cell carcinoma 12/05/2020   right ant lat deltoid, edc   Basal cell carcinoma 04/18/2022   R post deltoid, EDC   BCC (basal cell carcinoma of skin) 10/30/2022   R lat deltoid - ED&C   Skin cancer    unknown type - treated by Dr. Adolphus Birchwood     Past Surgical History: No past surgical history on file.  Allergies: Allergies as of 03/19/2023   (No Known Allergies)    Medications: Outpatient Encounter Medications as of 03/19/2023  Medication Sig   allopurinol (ZYLOPRIM) 100 MG tablet TAKE ONE TABLET BY MOUTH EVERY DAY   Aspirin Buf,CaCarb-MgCarb-MgO, 81 MG TABS Take by mouth.   atorvastatin (LIPITOR) 80 MG tablet    colchicine 0.6 MG tablet TAKE 2 TABS @ ONSET OF FLARE FOLLOWED BY 1 TAB 1 HOUR LATER MAX OF 3 TABS IN 1 attack.  May repeat in 3 days   fexofenadine (ALLEGRA) 180 MG tablet Take by mouth.   hydrochlorothiazide (HYDRODIURIL) 12.5 MG tablet Take by mouth.   lisinopril (ZESTRIL) 20 MG tablet    verapamil (CALAN-SR) 240 MG CR tablet Take by mouth.   Vitamin D, Ergocalciferol, (DRISDOL) 1.25 MG (50000 UNIT) CAPS capsule Take 1 capsule (50,000 Units total) by mouth every 7 (seven)  days.   No facility-administered encounter medications on file as of 03/19/2023.    Social History: Social History   Tobacco Use   Smoking status: Never   Smokeless tobacco: Never  Substance Use Topics   Alcohol use: Yes    Alcohol/week: 6.0 - 8.0 standard drinks of alcohol    Types: 6 - 8 Cans of beer per week    Comment: 6-8 beers weekly   Drug use: Never    Family Medical History: No family history on file.  Physical Examination: Today's Vitals   03/19/23 1054  BP: (!) 140/88   Weight: 90.7 kg  Height: 5\' 7"  (1.702 m)  PainSc: 3   PainLoc: Back   Body mass index is 31.32 kg/m.   General: Patient is well developed, well nourished, calm, collected, and in no apparent distress. Attention to examination is appropriate.  Psychiatric: Patient is non-anxious.  Head:  Pupils equal, round, and reactive to light.  ENT:  Oral mucosa appears well hydrated.  Neck:   Supple.  Full range of motion.  Respiratory: Patient is breathing without any difficulty.  Extremities: No edema.  Vascular: Palpable dorsal pedal pulses.  Skin:   On exposed skin, there are no abnormal skin lesions.  NEUROLOGICAL:     Awake, alert, oriented to person, place, and time.  Speech is clear and fluent. Fund of knowledge is appropriate.   Cranial Nerves: Pupils equal round and reactive to light.  Facial tone is symmetric.  Facial sensation is symmetric.  ROM of spine: full.  Palpation of spine: non tender.    Strength: Side Biceps Triceps Deltoid Interossei Grip Wrist Ext. Wrist Flex.  R 5 5 5 5 5 5 5   L 5 5 5 5 5 5 5    Side Iliopsoas Quads Hamstring PF DF EHL  R 5 5 5 5 5 5   L 5 5 5 5 5 5    Reflexes are 2+ and symmetric at the biceps, triceps, brachioradialis, patella and achilles, except 0 in left patella.   Clonus is not present.  Toes are down-going.  Bilateral upper and lower extremity sensation is intact to light touch except decreased sensation to light touch in right anterior shin Ambulates with an antalgic gait Medical Decision Making  Imaging: 05/30/22 MRI pelvis FINDINGS: Bones:   No hip fracture, dislocation or avascular necrosis.   No periosteal reaction or bone destruction. No aggressive osseous lesion.   Normal sacrum and sacroiliac joints. No SI joint widening or erosive changes.   Degenerative disease with disc height loss at L3-4 and L4-5. 2 mm retrolisthesis of L4 on L5. Broad-based disc osteophyte complex at L3-4, bilateral facet arthropathy,  severe spinal stenosis and bilateral foraminal stenosis. Broad-based disc osteophyte complex at L4-5, bilateral facet arthropathy, and mild spinal stenosis.   Articular cartilage and labrum   Articular cartilage: Partial-thickness cartilage loss of the right femoral head and acetabulum with subchondral reactive marrow edema in the anterior femoral head. Partial-thickness cartilage loss of the left femoral head and acetabulum.   Labrum: Right superior labral maceration. 6 mm right posterosuperior paralabral cyst. Left superior anterior labral tear.   Joint or bursal effusion   Joint effusion:  No hip joint effusion.  No SI joint effusion.   Bursae:  Small amount of fluid in the left iliopsoas bursa.   Muscles and tendons   Flexors: Normal.   Extensors: Normal.   Abductors: Normal.   Adductors: Normal.   Gluteals: High-grade partial tear of the right gluteus medius  tendon. Partial-thickness tear of the right gluteus minimus tendon.   Hamstrings: Moderate tendinosis of the left hamstring origin with a partial-thickness tear. Small interstitial tear of the right hamstring origin.   Other findings   No pelvic free fluid. No fluid collection or hematoma. No inguinal lymphadenopathy. No inguinal hernia.   IMPRESSION: 1. Mild-moderate osteoarthritis of the right hip. 2. Mild-moderate osteoarthritis of the left hip. 3. High-grade partial tear of the right gluteus medius tendon. Partial-thickness tear of the right gluteus minimus tendon. 4. Moderate tendinosis of the left hamstring origin with a partial-thickness tear. Small interstitial tear of the right hamstring origin. 5. Lumbar spine spondylosis as described above. If there is further clinical concern, recommend a dedicated MRI of the lumbar spine. 6. Mild left iliopsoas bursitis.     Electronically Signed   By: Elige Ko M.D.   On: 05/31/2022 08:41    I have personally reviewed the images and agree with the  above interpretation.  Assessment and Plan: Mr. Terpak is a pleasant 62 y.o. male with longstanding history of lumbosacral complaints.  His MRI of his pelvis is the only imaging available today which does show degenerative changes in his lumbar spine.  My primary concern is underlying facet arthropathy.  I would like to get a repeat dedicated lumbar MRI as his previous lumbar MRI is about a-year-old.  We discussed that he may benefit from radiofrequency nerve ablations and he will contact Dr. Renaldo Reel to consider this.  In the meantime I would also like to obtain flexion-extension x-rays to evaluate for a dynamic spondylolisthesis.  He will let us know when he is scheduled for this procedure with Dr. Renaldo Reel and I will schedule him shortly after for telephone visit to review his MRI results, x-ray results, and response to RFAs.  He has filled out a release of information for his physical therapy notes from [facility] as well.  He was encouraged to call the office in the interim should he have any questions or concerns.  He expressed understanding and was in agreement with this plan.  Thank you for involving me in the care of this patient.   I spent a total of 45 minutes in both face-to-face and non-face-to-face activities for this visit on the date of this encounter including, review of outside records, review of imaging, discussion of differential diagnosis, discussion of treatment options, discussion of plan of care, order placement, physical exam, and documentation.   [name]

## 2023-03-19 ENCOUNTER — Encounter: Payer: Self-pay | Admitting: Neurosurgery

## 2023-03-19 ENCOUNTER — Ambulatory Visit: Payer: BC Managed Care – PPO | Admitting: Neurosurgery

## 2023-03-19 ENCOUNTER — Ambulatory Visit
Admission: RE | Admit: 2023-03-19 | Discharge: 2023-03-19 | Disposition: A | Discharge status: Home / Self Care | Payer: BC Managed Care – PPO | Source: Ambulatory Visit | Attending: Neurosurgery | Admitting: Neurosurgery

## 2023-03-19 ENCOUNTER — Ambulatory Visit
Admission: RE | Admit: 2023-03-19 | Discharge: 2023-03-19 | Disposition: A | Discharge status: Home / Self Care | Payer: BC Managed Care – PPO | Attending: Neurosurgery | Admitting: Neurosurgery

## 2023-03-19 VITALS — BP 140/88 | Ht 67.0 in | Wt 200.0 lb

## 2023-03-19 DIAGNOSIS — M5441 Lumbago with sciatica, right side: Secondary | ICD-10-CM

## 2023-03-19 DIAGNOSIS — G8929 Other chronic pain: Secondary | ICD-10-CM

## 2023-03-19 DIAGNOSIS — M5416 Radiculopathy, lumbar region: Secondary | ICD-10-CM | POA: Insufficient documentation

## 2023-04-09 ENCOUNTER — Other Ambulatory Visit: Payer: BC Managed Care – PPO

## 2023-04-09 ENCOUNTER — Ambulatory Visit
Admission: RE | Admit: 2023-04-09 | Discharge: 2023-04-09 | Disposition: A | Discharge status: Home / Self Care | Payer: BC Managed Care – PPO | Source: Ambulatory Visit | Attending: Neurosurgery | Admitting: Neurosurgery

## 2023-04-09 DIAGNOSIS — M5416 Radiculopathy, lumbar region: Secondary | ICD-10-CM

## 2023-04-09 DIAGNOSIS — G8929 Other chronic pain: Secondary | ICD-10-CM

## 2023-05-02 ENCOUNTER — Ambulatory Visit (INDEPENDENT_AMBULATORY_CARE_PROVIDER_SITE_OTHER): Payer: BC Managed Care – PPO | Admitting: Neurosurgery

## 2023-05-02 DIAGNOSIS — G8929 Other chronic pain: Secondary | ICD-10-CM | POA: Diagnosis not present

## 2023-05-02 DIAGNOSIS — M48061 Spinal stenosis, lumbar region without neurogenic claudication: Secondary | ICD-10-CM | POA: Diagnosis not present

## 2023-05-02 DIAGNOSIS — M545 Low back pain, unspecified: Secondary | ICD-10-CM

## 2023-05-02 NOTE — Progress Notes (Signed)
Neurosurgery Telephone (Audio-Only) Note  Requesting Provider     Cyndia Diver, PA-C [address] [place],  [place] [address] T: [phone] F: [phone]  Primary Care Provider Cyndia Diver, PA-C [address] [place] [address] T: [phone] F: [phone]  Telehealth visit was conducted with [name], a 62 y.o. male via telephone.  History of Present Illness: Mr. Kilkenny is a 62 year old presenting today via telephone visit to review his MRI and x-ray results.  He states that he has had 2 facet injections since our last visit which provided him with hours of relief.  He is waiting to hear about a radiofrequency nerve ablation.  He denies any changes to his symptoms.  03/19/23 Mr. Oluwatise Springmeyer is a 62 year old with a history of OSA, hypertension, hyperlipidemia, gout, prediabetes, and chronic lumbosacral complaints who is here today with a chief complaint of persistent right-sided low back pain.  He states that this started about a year and a half ago after he had his left knee replaced and has persisted since then.  He has undergone multiple conservative interventions including physical therapy at [facility], medication management, and multiple epidural steroid injections with minimal relief.  Today he describes pain that starts in his right low back.  This seems to be worse with standing for prolonged periods of time and walking and improved some with laying down.  He does have some mild numbness in his right anterior shin but denies any radiating leg pain.  He denies any left-sided symptoms.  He was supposed to undergo RFA's with Dr. Renaldo Reel however he has postpone these as he is currently going through acupuncture although this is not helping.   Conservative measures: Acupuncture  Physical therapy:  has participated at [facility] in June 2023 without relief Multimodal medical therapy including regular antiinflammatories:  advil, tylenol,  tizanidine Injections:  has received epidural steroid injections 01/30/23: Right L3-4, L4-5 and L5-S1 facet - helped for a few hours the day of 4 interlaminar epidural steroid injections at L4-5 - no relief   Past Surgery: 11/20/22: Right SI fusion by Dr Yevette Edwards - did not help   [name] has no symptoms of cervical myelopathy.   The symptoms are causing a significant impact on the patient's life.   General Review of Systems:  A ROS was performed including pertinent positive and negatives as documented.  All other systems are negative.   Prior to Admission medications   Medication Sig Start Date End Date Taking? Authorizing Provider  allopurinol (ZYLOPRIM) 100 MG tablet TAKE ONE TABLET BY MOUTH EVERY DAY 09/15/18   [provider]  Aspirin Buf,CaCarb-MgCarb-MgO, 81 MG TABS Take by mouth. 06/15/10   [provider]  atorvastatin (LIPITOR) 80 MG tablet  04/30/19   [provider]  colchicine 0.6 MG tablet TAKE 2 TABS @ ONSET OF FLARE FOLLOWED BY 1 TAB 1 HOUR LATER MAX OF 3 TABS IN 1 attack.  May repeat in 3 days 10/02/17   [provider]  fexofenadine (ALLEGRA) 180 MG tablet Take by mouth. 12/28/10   [provider]  hydrochlorothiazide (HYDRODIURIL) 12.5 MG tablet Take by mouth. 09/15/18   [provider]  lisinopril (ZESTRIL) 20 MG tablet  04/30/19   [provider]  verapamil (CALAN-SR) 240 MG CR tablet Take by mouth. 09/15/18   [provider]  Vitamin D, Ergocalciferol, (DRISDOL) 1.25 MG (50000 UNIT) CAPS capsule Take 1 capsule (50,000 Units total) by mouth every 7 (seven) days. 05/16/22  Judi Saa, DO    DATA REVIEWED    Imaging Studies  04/09/23 MRI L spine   FINDINGS: Segmentation: Standard.   Alignment: Levocurvature centered at L3. Left lateral translation of L3 on L4. Mild grade 1 retrolisthesis of L4 on L5.   Vertebrae: Degenerative/discogenic endplate signal changes at L2-L3, L3-L4 and  L4-L5.   Conus medullaris and cauda equina: Conus extends to the T12-L1 level. Conus and cauda equina appear normal.   Paraspinal and other soft tissues: Unremarkable.   Disc levels:   T12-L1: Mild facet arthropathy.  No significant stenosis.   L1-L2: Mild disc bulging. Ligamentum flavum thickening and right greater than left facet arthropathy. Resulting moderate to severe right foraminal stenosis. Patent canal and left foramen.   L2-L3: Disc bulging. Ligamentum flavum thickening and right greater than left facet arthropathy. Mild right foraminal stenosis. Mild canal and right greater than left subarticular recess narrowing.   L3-L4: Disc bulge. Ligamentum flavum thickening and facet arthropathy resulting moderate to severe canal stenosis. Mild-to-moderate right foraminal stenosis.   L4-L5: Disc bulge. Bilateral facet arthropathy. Mild to moderate left and mild right foraminal stenosis. Patent canal.   L5-S1: Bilateral facet arthropathy. Mild disc bulging. Moderate left and mild right foraminal stenosis.   IMPRESSION: 1. At L3-L4, moderate to severe canal stenosis and mild-to-moderate right foraminal stenosis. 2. At L1-L2, moderate to severe right foraminal stenosis. 3. At L5-S1, moderate left and mild right foraminal stenosis. 4. At L4-L5, mild-to-moderate left and mild right foraminal stenosis. 5. At L2-L3, mild canal and right foraminal stenosis.     Electronically Signed   By: Feliberto Harts M.D.   On: 04/20/2023 08:21  03/24/23 lumbar xrays IMPRESSION: 1. No evidence of dynamic lumbar spine instability given the acquired degrees of flexion and extension. 2. Moderate to severe multilevel lumbar spine DDD, worse at L2-L3, L3-L4 and L4-L5. 3. Sequela previous SI joint fusion, incompletely evaluated.     Electronically Signed   By: Simonne Come M.D.   On: 03/24/2023 15:31  IMPRESSION  Mr. Widrick is a 62 y.o. male who I performed a telephone encounter today for  evaluation and management of: Lumbar stenosis and chronic back pain PLAN  Mr. Saleem is a very pleasant 62 year old presenting today via telephone visit to review his MRI and x-ray results.  While his MRI does show significant lumbar stenosis particularly at L3-4, he is not having symptoms concerning for radiculopathy or neurogenic claudication.  His dynamic spine x-rays do not show any significant listhesis.  I recommended that he move forward with ablations.  If this is not effective we are more than happy to see him back to discuss if there is any indication for surgical intervention.  I will otherwise see him going forward on an as-needed basis.  He expressed understanding and was in agreement with this plan.  No orders of the defined types were placed in this encounter.   DISPOSITION  Follow up: In person appointment in  PRN  Susanne Borders, PA   TELEPHONE DOCUMENTATION   This visit was performed via telephone.  Patient location: home Provider location: office  I spent a total of 5 minutes non-face-to-face activities for this visit on the date of this encounter including review of current clinical condition and response to treatment.  The patient is aware of and accepts the limits of this telehealth visit.

## 2023-05-23 ENCOUNTER — Telehealth: Payer: BC Managed Care – PPO | Admitting: Neurosurgery

## 2023-06-05 ENCOUNTER — Encounter: Payer: Self-pay | Admitting: Dermatology

## 2023-06-05 ENCOUNTER — Ambulatory Visit: Payer: BC Managed Care – PPO | Admitting: Dermatology

## 2023-06-05 DIAGNOSIS — L578 Other skin changes due to chronic exposure to nonionizing radiation: Secondary | ICD-10-CM | POA: Diagnosis not present

## 2023-06-05 DIAGNOSIS — W908XXA Exposure to other nonionizing radiation, initial encounter: Secondary | ICD-10-CM | POA: Diagnosis not present

## 2023-06-05 DIAGNOSIS — Z5111 Encounter for antineoplastic chemotherapy: Secondary | ICD-10-CM

## 2023-06-05 DIAGNOSIS — L738 Other specified follicular disorders: Secondary | ICD-10-CM

## 2023-06-05 DIAGNOSIS — B078 Other viral warts: Secondary | ICD-10-CM | POA: Diagnosis not present

## 2023-06-05 DIAGNOSIS — Z7189 Other specified counseling: Secondary | ICD-10-CM

## 2023-06-05 DIAGNOSIS — L821 Other seborrheic keratosis: Secondary | ICD-10-CM | POA: Diagnosis not present

## 2023-06-05 DIAGNOSIS — L570 Actinic keratosis: Secondary | ICD-10-CM | POA: Diagnosis not present

## 2023-06-05 DIAGNOSIS — Z79899 Other long term (current) drug therapy: Secondary | ICD-10-CM

## 2023-06-05 NOTE — Patient Instructions (Addendum)
Cryotherapy Aftercare  Wash gently with soap and water everyday.   Apply Vaseline Jelly daily until healed.    Use Fluorouracil/Calcipotriene twice daily for 7 days to nose and any residual areas on scalp.  Start prescription 5-fluorouracil/salicylic acid wart paste from [facility] nightly under occlusion. Reviewed risk of irritation and risk scarring if applied to normal skin. If irritation develops, stop medication for a few days until area calm, then restart a very small amount just to the wart. This medication cannot be used by pregnant women. Patient advised they will receive an email from the [facility] pharmacy and can purchase the medication online through a link in the email.   Instructions for [facility] Medications  One or more of your medications was sent to the [facility] mail order compounding pharmacy. You will receive an email from them and can purchase the medicine through that link. It will then be mailed to your home at the address you confirmed. If for any reason you do not receive an email from them, please check your spam folder. If you still do not find the email, please let us know. [facility] phone number is [phone].   Recommend daily broad spectrum sunscreen SPF 30+ to sun-exposed areas, reapply every 2 hours as needed. Call for new or changing lesions.  Staying in the shade or wearing long sleeves, sun glasses (UVA+UVB protection) and wide brim hats (4-inch brim around the entire circumference of the hat) are also recommended for sun protection.    Due to recent changes in healthcare laws, you may see results of your pathology and/or laboratory studies on MyChart before the doctors have had a chance to review them. We understand that in some cases there may be results that are confusing or concerning to you. Please understand that not all results are received at the same time and often the doctors may need to interpret multiple results in  order to provide you with the best plan of care or course of treatment. Therefore, we ask that you please give [place] 2 business days to thoroughly review all your results before contacting the office for clarification. Should we see a critical lab result, you will be contacted sooner.   If You Need Anything After Your Visit  If you have any questions or concerns for your doctor, please call our main line at [phone] and press option 4 to reach your doctor's medical assistant. If no one answers, please leave a voicemail as directed and we will return your call as soon as possible. Messages left after 4 pm will be answered the following business day.   You may also send [place] a message via MyChart. We typically respond to MyChart messages within 1-2 business days.  For prescription refills, please ask your pharmacy to contact our office. Our fax number is [phone].  If you have an urgent issue when the clinic is closed that cannot wait until the next business day, you can page your doctor at the number below.    Please note that while we do our best to be available for urgent issues outside of office hours, we are not available 24/7.   If you have an urgent issue and are unable to reach [place], you may choose to seek medical care at your doctor's office, retail clinic, urgent care center, or emergency room.  If you have a medical emergency, please immediately call 911 or go to the emergency department.  Pager Numbers  - Dr. Gwen Pounds: [phone]  -  Dr. Roseanne Reno: [phone]  - Dr. Katrinka Blazing: [phone]   In the event of inclement weather, please call our main line at [phone] for an update on the status of any delays or closures.  Dermatology Medication Tips: Please keep the boxes that topical medications come in in order to help keep track of the instructions about where and how to use these. Pharmacies typically print the medication instructions only on the boxes and not directly on  the medication tubes.   If your medication is too expensive, please contact our office at [phone] option 4 or send [place] a message through MyChart.   We are unable to tell what your co-pay for medications will be in advance as this is different depending on your insurance coverage. However, we may be able to find a substitute medication at lower cost or fill out paperwork to get insurance to cover a needed medication.   If a prior authorization is required to get your medication covered by your insurance company, please allow [place] 1-2 business days to complete this process.  Drug prices often vary depending on where the prescription is filled and some pharmacies may offer cheaper prices.  The website www.goodrx.com contains coupons for medications through different pharmacies. The prices here do not account for what the cost may be with help from insurance (it may be cheaper with your insurance), but the website can give you the price if you did not use any insurance.  - You can print the associated coupon and take it with your prescription to the pharmacy.  - You may also stop by our office during regular business hours and pick up a GoodRx coupon card.  - If you need your prescription sent electronically to a different pharmacy, notify our office through [facility] or by phone at [phone] option 4.     Si Usted Necesita Algo Despus de Su Visita  Tambin puede enviarnos un mensaje a travs de Clinical cytogeneticist. Por lo general respondemos a los mensajes de MyChart en el transcurso de 1 a 2 das hbiles.  Para renovar recetas, por favor pida a su farmacia que se ponga en contacto con nuestra oficina. Annie Sable de fax es [place] [phone].  Si tiene un asunto urgente cuando la clnica est cerrada y que no puede esperar hasta el siguiente da hbil, puede llamar/localizar a su doctor(a) al nmero que aparece a continuacin.   Por favor, tenga en cuenta que aunque hacemos todo lo  posible para estar disponibles para asuntos urgentes fuera del horario de [place], no estamos disponibles las 24 horas del da, los 7 [address]  [address] de la [place].   Si tiene un problema urgente y no puede comunicarse con nosotros, puede optar por buscar atencin mdica  en el consultorio de su doctor(a), en una clnica privada, en un centro de atencin urgente o en una sala de emergencias.  Si tiene Engineer, drilling, por favor llame inmediatamente al 911 o vaya a la sala de emergencias.  Nmeros de bper  - Dr. Gwen Pounds: [phone]  - Dra. Roseanne Reno: [phone]  - Dr. Katrinka Blazing: [phone]   En caso de inclemencias del tiempo, por favor llame a Lacy Duverney principal al [phone] para una actualizacin sobre el [place] de cualquier retraso o cierre.  Consejos para la medicacin en dermatologa: Por favor, guarde las cajas en las que vienen los medicamentos de uso tpico para ayudarle a seguir las instrucciones sobre dnde y cmo usarlos. Las farmacias generalmente imprimen las instrucciones del medicamento slo en las  cajas y no directamente en los tubos del medicamento.   Si su medicamento es muy caro, por favor, pngase en contacto con Rolm Gala llamando al [phone] y presione la opcin 4 o envenos un mensaje a travs de Clinical cytogeneticist.   No podemos decirle cul ser su copago por los medicamentos por adelantado ya que esto es diferente dependiendo de la cobertura de su seguro. Sin embargo, es posible que podamos encontrar un medicamento sustituto a Audiological scientist un formulario para que el seguro cubra el medicamento que se considera necesario.   Si se requiere una autorizacin previa para que su compaa de seguros [place] su medicamento, por favor permtanos de 1 a 2 das hbiles para completar [address]  Los precios de los medicamentos varan con frecuencia dependiendo del Environmental consultant de dnde se surte la receta y alguna farmacias pueden ofrecer precios ms baratos.  El sitio web  www.goodrx.com tiene cupones para medicamentos de Health and safety inspector. Los precios aqu no tienen en cuenta lo que podra costar con la ayuda del seguro (puede ser ms barato con su seguro), pero el sitio web puede darle el precio si no utiliz Tourist information centre manager.  - Puede imprimir el cupn correspondiente y llevarlo con su receta a la farmacia.  - Tambin puede pasar por nuestra oficina durante el horario de atencin regular y Education officer, museum una tarjeta de cupones de GoodRx.  - Si necesita que su receta se enve electrnicamente a una farmacia diferente, informe a nuestra oficina a travs de MyChart de [place] o por telfono llamando al [phone] y presione la opcin 4.

## 2023-06-05 NOTE — Progress Notes (Signed)
Follow-Up Visit   Subjective  Todd Madden is a 63 y.o. male who presents for the following: 6 month AK follow up. Scalp, ears. Tx with LN2 10/22/2022. Used 5FU/Calcipotriene on scalp since last visit.   Check spots on feet. Compound W is not helping.   The patient has spots, moles and lesions to be evaluated, some may be new or changing and the patient may have concern these could be cancer.   The following portions of the chart were reviewed this encounter and updated as appropriate: medications, allergies, medical history  Review of Systems:  No other skin or systemic complaints except as noted in HPI or Assessment and Plan.  Objective  Well appearing patient in no apparent distress; mood and affect are within normal limits.  A focused examination was performed of the following areas: Scalp, face, hands, feet  Relevant exam findings are noted in the Assessment and Plan.  Scalp x2 (2) Erythematous thin papules/macules with gritty scale.   L plantar foot x1, R plantar foot x1 (2) Verrucous papules -- Discussed viral etiology and contagion.     Assessment & Plan   ACTINIC DAMAGE WITH PRECANCEROUS ACTINIC KERATOSES Counseling for Topical Chemotherapy Management: Patient exhibits: - Severe, confluent actinic changes with pre-cancerous actinic keratoses that is secondary to cumulative UV radiation exposure over time - Condition that is severe; chronic, not at goal. - diffuse scaly erythematous macules and papules with underlying dyspigmentation - Discussed Prescription "Field Treatment" topical Chemotherapy for Severe, Chronic Confluent Actinic Changes with Pre-Cancerous Actinic Keratoses Field treatment involves treatment of an entire area of skin that has confluent Actinic Changes (Sun/ Ultraviolet light damage) and PreCancerous Actinic Keratoses by method of PhotoDynamic Therapy (PDT) and/or prescription Topical Chemotherapy agents such as 5-fluorouracil,  5-fluorouracil/calcipotriene, and/or imiquimod.  The purpose is to decrease the number of clinically evident and subclinical PreCancerous lesions to prevent progression to development of skin cancer by chemically destroying early precancer changes that may or may not be visible.  It has been shown to reduce the risk of developing skin cancer in the treated area. As a result of treatment, redness, scaling, crusting, and open sores may occur during treatment course. One or more than one of these methods may be used and may have to be used several times to control, suppress and eliminate the PreCancerous changes. Discussed treatment course, expected reaction, and possible side effects. - Recommend daily broad spectrum sunscreen SPF 30+ to sun-exposed areas, reapply every 2 hours as needed.  - Staying in the shade or wearing long sleeves, sun glasses (UVA+UVB protection) and wide brim hats (4-inch brim around the entire circumference of the hat) are also recommended. - Call for new or changing lesions.  Use Fluorouracil/Calcipotriene twice daily for 7 days to nose and any residual areas on scalp  Sebaceous Hyperplasia - Small yellow papules with a central dell - Benign-appearing - Observe. Call for changes.   SEBORRHEIC KERATOSIS - Stuck-on, waxy, tan-brown papules and/or plaques  - Benign-appearing - Discussed benign etiology and prognosis. - Observe - Call for any changes   AK (actinic keratosis) (2) Scalp x2  (pt declines treating nose lesion today and will use topical chemo on that area)  Actinic keratoses are precancerous spots that appear secondary to cumulative UV radiation exposure/sun exposure over time. They are chronic with expected duration over 1 year. A portion of actinic keratoses will progress to squamous cell carcinoma of the skin. It is not possible to reliably predict which spots will progress  to skin cancer and so treatment is recommended to prevent development of skin  cancer.  Recommend daily broad spectrum sunscreen SPF 30+ to sun-exposed areas, reapply every 2 hours as needed.  Recommend staying in the shade or wearing long sleeves, sun glasses (UVA+UVB protection) and wide brim hats (4-inch brim around the entire circumference of the hat). Call for new or changing lesions.  Use Fluorouracil/Calcipotriene twice daily for 7 days to nose and any residual areas on scalp  Destruction of lesion - Scalp x2 (2) Complexity: simple   Destruction method: cryotherapy   Informed consent: discussed and consent obtained   Timeout:  patient name, date of birth, surgical site, and procedure verified Lesion destroyed using liquid nitrogen: Yes   Region frozen until ice ball extended beyond lesion: Yes   Outcome: patient tolerated procedure well with no complications   Post-procedure details: wound care instructions given   Additional details:  Prior to procedure, discussed risks of blister formation, small wound, skin dyspigmentation, or rare scar following cryotherapy. Recommend Vaseline ointment to treated areas while healing.   Other viral warts (2) L plantar foot x1, R plantar foot x1  Viral Wart (HPV) Counseling  Discussed viral / HPV (Human Papilloma Virus) etiology and risk of spread /infectivity to other areas of body as well as to other people.  Multiple treatments and methods may be required to clear warts and it is possible treatment may not be successful.  Treatment risks include discoloration; scarring and there is still potential for wart recurrence.  Start prescription 5-fluorouracil/salicylic acid wart paste from [facility] nightly under occlusion. Reviewed risk of irritation and risk scarring if applied to normal skin. If irritation develops, stop medication for a few days until area calm, then restart a very small amount just to the wart. This medication cannot be used by pregnant women. Patient advised they will receive an email from the [facility] pharmacy and can purchase the medication online through a link in the email.   Destruction of lesion - L plantar foot x1, R plantar foot x1 (2) Complexity: simple   Destruction method: cryotherapy   Informed consent: discussed and consent obtained   Timeout:  patient name, date of birth, surgical site, and procedure verified Lesion destroyed using liquid nitrogen: Yes   Region frozen until ice ball extended beyond lesion: Yes   Outcome: patient tolerated procedure well with no complications   Post-procedure details: wound care instructions given   Additional details:  Prior to procedure, discussed risks of blister formation, small wound, skin dyspigmentation, or rare scar following cryotherapy. Recommend Vaseline ointment to treated areas while healing.     Return in about 4 months (around 10/05/2023) for AK Follow Up.  I, Lawson Radar, CMA, am acting as scribe for Armida Sans, MD.   Documentation: I have reviewed the above documentation for accuracy and completeness, and I agree with the above.  Armida Sans, MD

## 2023-06-25 ENCOUNTER — Ambulatory Visit: Payer: BC Managed Care – PPO | Admitting: Neurosurgery

## 2023-06-25 ENCOUNTER — Encounter: Payer: Self-pay | Admitting: Neurosurgery

## 2023-06-25 VITALS — BP 130/88 | Ht 67.0 in | Wt 202.6 lb

## 2023-06-25 DIAGNOSIS — M542 Cervicalgia: Secondary | ICD-10-CM

## 2023-06-25 DIAGNOSIS — G8929 Other chronic pain: Secondary | ICD-10-CM | POA: Diagnosis not present

## 2023-06-25 DIAGNOSIS — M545 Low back pain, unspecified: Secondary | ICD-10-CM | POA: Diagnosis not present

## 2023-06-25 MED ORDER — METHOCARBAMOL 500 MG PO TABS: 500.0000 mg | ORAL_TABLET | Freq: Three times a day (TID) | ORAL | 0 refills | Status: DC | PRN

## 2023-06-25 NOTE — Progress Notes (Signed)
Follow-up note: Referring Physician:  No referring provider defined for this encounter.  Primary Physician:  Cyndia Diver, PA-C  Chief Complaint:  f/u after ineffective lumbar ablations  History of Present Illness: Todd Madden is a 62 y.o. male who presents for follow up after RFA. He underwent a right lumbar RFA at [facility] orthopedics 6 weeks ago without any relief.  He is still having significant low back pain with activity worse on the right than the left without any radiating leg pain, numbness, or tingling. In addition to his lumbar symptoms, he has had about 2 weeks of left-sided pain at the base of his neck that is worse with talking for prolonged periods of time or prolonged chewing.  He has tried acupuncture without any relief.  05/02/23 Todd Madden is a 62 year old presenting today via telephone visit to review his MRI and x-ray results.  He states that he has had 2 facet injections since our last visit which provided him with hours of relief.  He is waiting to hear about a radiofrequency nerve ablation.  He denies any changes to his symptoms.   03/19/23 Todd Madden is a 62 year old with a history of OSA, hypertension, hyperlipidemia, gout, prediabetes, and chronic lumbosacral complaints who is here today with a chief complaint of persistent right-sided low back pain.  He states that this started about a year and a half ago after he had his left knee replaced and has persisted since then.  He has undergone multiple conservative interventions including physical therapy at [facility], medication management, and multiple epidural steroid injections with minimal relief.  Today he describes pain that starts in his right low back.  This seems to be worse with standing for prolonged periods of time and walking and improved some with laying down.  He does have some mild numbness in his right anterior shin but denies any radiating leg pain.  He denies any left-sided symptoms.  He was  supposed to undergo RFA's with Dr. Renaldo Reel however he has postpone these as he is currently going through acupuncture although this is not helping.   Conservative measures: Acupuncture  Physical therapy:  has participated at [facility] in June 2023 without relief Multimodal medical therapy including regular antiinflammatories:  advil, tylenol, tizanidine Injections:  has received epidural steroid injections 01/30/23: Right L3-4, L4-5 and L5-S1 facet - helped for a few hours the day of 4 interlaminar epidural steroid injections at L4-5 - no relief   Past Surgery: 11/20/22: Right SI fusion by Dr Yevette Edwards - did not help   Todd Madden has no symptoms of cervical myelopathy.   The symptoms are causing a significant impact on the patient's life.   Review of Systems:  A 10 point review of systems is negative, and the pertinent positives and negatives detailed in the HPI.  Past Medical History: Past Medical History:  Diagnosis Date   Actinic keratosis    Basal cell carcinoma 04/18/2020   Right post. shoulder. Superficial.    Basal cell carcinoma 08/16/2020   R mid back    Basal cell carcinoma 10/12/2020   right distal lat tricep above elbow, right distal lat tricep above elbow medial - EDC x 2   Basal cell carcinoma 12/05/2020   right ant lat deltoid, edc   Basal cell carcinoma 04/18/2022   R post deltoid, EDC   BCC (basal cell carcinoma of skin) 10/30/2022   R lat deltoid - ED&C   Skin cancer    unknown type -  treated by Dr. Adolphus Birchwood     Past Surgical History: No past surgical history on file.  Allergies: Allergies as of 06/25/2023   (No Known Allergies)    Medications: Outpatient Encounter Medications as of 06/25/2023  Medication Sig   allopurinol (ZYLOPRIM) 100 MG tablet TAKE ONE TABLET BY MOUTH EVERY DAY   Aspirin Buf,CaCarb-MgCarb-MgO, 81 MG TABS Take by mouth.   atorvastatin (LIPITOR) 80 MG tablet    colchicine 0.6 MG tablet TAKE 2 TABS @ ONSET OF FLARE FOLLOWED BY 1 TAB  1 HOUR LATER MAX OF 3 TABS IN 1 attack.  May repeat in 3 days   fexofenadine (ALLEGRA) 180 MG tablet Take by mouth.   hydrochlorothiazide (HYDRODIURIL) 12.5 MG tablet Take by mouth.   lisinopril (ZESTRIL) 20 MG tablet    verapamil (CALAN-SR) 240 MG CR tablet Take by mouth.   Vitamin D, Ergocalciferol, (DRISDOL) 1.25 MG (50000 UNIT) CAPS capsule Take 1 capsule (50,000 Units total) by mouth every 7 (seven) days.   No facility-administered encounter medications on file as of 06/25/2023.    Social History: Social History   Tobacco Use   Smoking status: Never   Smokeless tobacco: Never  Substance Use Topics   Alcohol use: Yes    Alcohol/week: 6.0 - 8.0 standard drinks of alcohol    Types: 6 - 8 Cans of beer per week    Comment: 6-8 beers weekly   Drug use: Never    Family Medical History: No family history on file.  Exam: Today's Vitals   06/25/23 1406  BP: 130/88  Weight: 91.9 kg  Height: 5\' 7"  (1.702 m)  PainSc: 2   PainLoc: Back   Body mass index is 31.73 kg/m.   General: A&Ox3  .  Strength in the left lower extremity is EHL 5/5, Dorsiflexion 5/5, Plantar flexion 5/5, Hamstring 5/5, Quadricep 5/5, Iliopsoas 5/5. Strength in the right lower extremity is EHL 5/5, Dorsiflexion 5/5, Plantar flexion 5/5, Hamstring 5/5, Quadricep 5/5, Iliopsoas 5/5. Reflexes are 2+ and symmetric at the patella and achilles.   Bilateral lower extremity sensation is intact to light touch.   Gait is normal.  Imaging: 04/09/23 EXAM: MRI LUMBAR SPINE WITHOUT CONTRAST   TECHNIQUE: Multiplanar, multisequence MR imaging of the lumbar spine was performed. No intravenous contrast was administered.   COMPARISON:  None Available.   FINDINGS: Segmentation: Standard.   Alignment: Levocurvature centered at L3. Left lateral translation of L3 on L4. Mild grade 1 retrolisthesis of L4 on L5.   Vertebrae: Degenerative/discogenic endplate signal changes at L2-L3, L3-L4 and L4-L5.   Conus medullaris and  cauda equina: Conus extends to the T12-L1 level. Conus and cauda equina appear normal.   Paraspinal and other soft tissues: Unremarkable.   Disc levels:   T12-L1: Mild facet arthropathy.  No significant stenosis.   L1-L2: Mild disc bulging. Ligamentum flavum thickening and right greater than left facet arthropathy. Resulting moderate to severe right foraminal stenosis. Patent canal and left foramen.   L2-L3: Disc bulging. Ligamentum flavum thickening and right greater than left facet arthropathy. Mild right foraminal stenosis. Mild canal and right greater than left subarticular recess narrowing.   L3-L4: Disc bulge. Ligamentum flavum thickening and facet arthropathy resulting moderate to severe canal stenosis. Mild-to-moderate right foraminal stenosis.   L4-L5: Disc bulge. Bilateral facet arthropathy. Mild to moderate left and mild right foraminal stenosis. Patent canal.   L5-S1: Bilateral facet arthropathy. Mild disc bulging. Moderate left and mild right foraminal stenosis.   IMPRESSION: 1. At L3-L4, moderate  to severe canal stenosis and mild-to-moderate right foraminal stenosis. 2. At L1-L2, moderate to severe right foraminal stenosis. 3. At L5-S1, moderate left and mild right foraminal stenosis. 4. At L4-L5, mild-to-moderate left and mild right foraminal stenosis. 5. At L2-L3, mild canal and right foraminal stenosis.     Electronically Signed   By: Feliberto Harts M.D.   On: 04/20/2023 08:21  I have personally reviewed the images and agree with the above interpretation.  Assessment and Plan: Mr. Kirchgessner is a pleasant 62 y.o. male with longstanding history of lumbosacral complaints.  His MRI shows significant degenerative changes throughout however his x-rays do not show any dynamic instability.  Despite having responded well to facet injections, he does not have any relief from his right sided radiofrequency ablations and now cannot have another for 6 months.  We  discussed that may be challenging to make a case for surgical intervention given his isolated back pain however he has been dealing with this for several years and would really like to discuss any possible surgical options.  I will get him scheduled with Dr. Myer Haff to discuss if there is any role for surgery.  I cautioned him that he may have to complete physical therapy as he has not done so this year but he would like to know if there is any role for surgery prior to pursuing this as it has not been effective for him in the past.  We also briefly discussed the potential role of a spinal cord stimulator. In regards to his cervical complaints, we discussed the potential role of a cervical MRI however the location of his symptoms is largely isolated to the base of his neck and is not particularly radicular in fashion.  I recommended conservative management with NSAIDs and have given a prescription of Robaxin.  I spent a total of 30 minutes in both face-to-face and non-face-to-face activities for this visit on the date of this encounter including review of imaging, discussion of symptoms, physical exam, brief discussion of treatment options, order placement, and documentation  [name] PA-C Neurosurgery

## 2023-06-26 NOTE — Progress Notes (Signed)
Referring Physician:  Cyndia Diver, PA-C [address] [place],  [place] [address]  Primary Physician:  Cyndia Diver, PA-C  History of Present Illness: 07/02/2023 Todd Madden presents with two-year history of severe lower back pain. The pain is exacerbated by standing or walking, with the patient reporting an ability to stand for only a couple of minutes and walk approximately twenty to thirty yards before needing to stop due to intense discomfort. Sitting down reportedly alleviates the pain.  He has tried physical therapy approximately 1 year ago.  He has tried multiple different injections without improvement.  His wife notes that he stands stooped forward.  The patient has a history of a sacroiliac (SI) joint fusion, which unfortunately did not provide any relief. He also reports numbness in the shins, but denies any leg pain. The patient has undergone multiple interventions, including epidural injections and physical therapy, without significant improvement. A radiofrequency ablation procedure was performed approximately six to seven weeks prior to the consultation, but it was unsuccessful in alleviating the pain.  The patient also underwent a platelet-rich plasma (PRP) procedure, but it did not yield any significant improvement. The patient has a history of carpal tunnel syndrome in both wrists and has undergone knee replacement surgery. He reports that the back pain started after the knee replacement.  The patient has not noticed any significant loss of height or changes in gait, although he did use a heel lift in his left shoe for a while. He reports a localized area of decreased sensation on the right leg. The patient's quality of life has been significantly impacted by the persistent back pain, expressing frustration with the lack of improvement despite multiple interventions.   History of Present Illness: Notes from Todd Charity, PA-C Todd Madden is a 63 y.o. male who  presents for follow up after RFA. He underwent a right lumbar RFA at [facility] orth[facility] 6 weeks ago without any relief.  He is still having significant low back pain with activity worse on the right than the left without any radiating leg pain, numbness, or tingling. In addition to his lumbar symptoms, he has had about 2 weeks of left-sided pain at the base of his neck that is worse with talking for prolonged periods of time or prolonged chewing.  He has tried acupuncture without any relief.   05/02/23 Todd Madden is a 62 year old presenting today via telephone visit to review his MRI and x-ray results.  He states that he has had 2 facet injections since our last visit which provided him with hours of relief.  He is waiting to hear about a radiofrequency nerve ablation.  He denies any changes to his symptoms.   03/19/23 Todd Madden is a 62 year old with a history of OSA, hypertension, hyperlipidemia, gout, prediabetes, and chronic lumbosacral complaints who is here today with a chief complaint of persistent right-sided low back pain.  He states that this started about a year and a half ago after he had his left knee replaced and has persisted since then.  He has undergone multiple conservative interventions including physical therapy at [facility], medication management, and multiple epidural steroid injections with minimal relief.  Today he describes pain that starts in his right low back.  This seems to be worse with standing for prolonged periods of time and walking and improved some with laying down.  He does have some mild numbness in his right anterior shin but denies any radiating leg pain.  He denies any  left-sided symptoms.  He was supposed to undergo RFA's with Todd. Renaldo Madden however he has postpone these as he is currently going through acupuncture although this is not helping.   Conservative measures: Acupuncture  Physical therapy:  has participated at [facility] in June 2023 without  relief Multimodal medical therapy including regular antiinflammatories:  advil, tylenol, tizanidine Injections:  has received epidural steroid injections 01/30/23: Right L3-4, L4-5 and L5-S1 facet - helped for a few hours the day of 4 interlaminar epidural steroid injections at L4-5 - no relief   Past Surgery: 11/20/22: Right SI fusion by Todd Madden - did not help  Todd Madden has no symptoms of cervical myelopathy.  The symptoms are causing a significant impact on the patient's life.   I have utilized the care everywhere function in epic to review the outside records available from external health systems.  Review of Systems:  A 10 point review of systems is negative, except for the pertinent positives and negatives detailed in the HPI.  Past Medical History: Past Medical History:  Diagnosis Date   Actinic keratosis    Basal cell carcinoma 04/18/2020   Right post. shoulder. Superficial.    Basal cell carcinoma 08/16/2020   R mid back    Basal cell carcinoma 10/12/2020   right distal lat tricep above elbow, right distal lat tricep above elbow medial - EDC x 2   Basal cell carcinoma 12/05/2020   right ant lat deltoid, edc   Basal cell carcinoma 04/18/2022   R post deltoid, EDC   BCC (basal cell carcinoma of skin) 10/30/2022   R lat deltoid - ED&C   Skin cancer    unknown type - treated by Todd. Adolphus Birchwood     Past Surgical History: Past Surgical History:  Procedure Laterality Date    TENDON SHEATH INCISION (EG, FOR TRIGGER FINGER)-THUMB Right 09/06/2017   INJECTION(S); SINGLE TENDON ORIGIN/INSERTION mIddle finger Right 08/11/2019   KNEE ARTHROSCOPY     KNEE ARTHROSCOPY W/ MENISCECTOMY Left    NEUROPLASTY AND/OR TRANSPOSITION; MEDIAN NERVE AT CARPAL TUNNEL Left 06/05/2016   NEUROPLASTY AND/OR TRANSPOSITION; MEDIAN NERVE AT CARPAL TUNNEL Right 06/26/2016   OLECRANON BURSECTOMY     REPLACEMENT TOTAL KNEE Left    ROTATOR CUFF REPAIR Left    ROTATOR CUFF REPAIR Right    TENDON  SHEATH INCISION (EG, FOR TRIGGER FINGER) Middle Left 08/11/2019    Allergies: Allergies as of 07/02/2023   (No Known Allergies)    Medications:  Current Outpatient Medications:    meloxicam (MOBIC) 15 MG tablet, Take 15 mg by mouth daily., Disp: , Rfl:    allopurinol (ZYLOPRIM) 100 MG tablet, TAKE ONE TABLET BY MOUTH EVERY DAY, Disp: , Rfl:    Aspirin Buf,CaCarb-MgCarb-MgO, 81 MG TABS, Take by mouth., Disp: , Rfl:    atorvastatin (LIPITOR) 80 MG tablet, , Disp: , Rfl:    colchicine 0.6 MG tablet, TAKE 2 TABS @ ONSET OF FLARE FOLLOWED BY 1 TAB 1 HOUR LATER MAX OF 3 TABS IN 1 attack.  May repeat in 3 days, Disp: , Rfl:    fexofenadine (ALLEGRA) 180 MG tablet, Take by mouth., Disp: , Rfl:    hydrochlorothiazide (HYDRODIURIL) 12.5 MG tablet, Take by mouth., Disp: , Rfl:    lisinopril (ZESTRIL) 20 MG tablet, , Disp: , Rfl:    methocarbamol (ROBAXIN) 500 MG tablet, Take 1 tablet (500 mg total) by mouth every 8 (eight) hours as needed for muscle spasms., Disp: 90 tablet, Rfl: 0   verapamil (  CALAN-SR) 240 MG CR tablet, Take by mouth., Disp: , Rfl:    Vitamin D, Ergocalciferol, (DRISDOL) 1.25 MG (50000 UNIT) CAPS capsule, Take 1 capsule (50,000 Units total) by mouth every 7 (seven) days., Disp: 12 capsule, Rfl: 0  Social History: Social History   Tobacco Use   Smoking status: Never   Smokeless tobacco: Never  Substance Use Topics   Alcohol use: Yes    Alcohol/week: 6.0 - 8.0 standard drinks of alcohol    Types: 6 - 8 Cans of beer per week    Comment: 6-8 beers weekly   Drug use: Never    Family Medical History: No family history on file.  Physical Examination: Vitals:   07/02/23 1305  BP: 122/76    General: Patient is in no apparent distress. Attention to examination is appropriate.  Neck:   Supple.  Full range of motion.  Respiratory: Patient is breathing without any difficulty.   NEUROLOGICAL:     Awake, alert, oriented to person, place, and time.  Speech is clear and  fluent.   Cranial Nerves: Pupils equal round and reactive to light.  Facial tone is symmetric.  Facial sensation is symmetric. Shoulder shrug is symmetric. Tongue protrusion is midline.  There is no pronator drift.  Strength: Side Biceps Triceps Deltoid Interossei Grip Wrist Ext. Wrist Flex.  R 5 5 5 5 5 5 5   L 5 5 5 5 5 5 5    Side Iliopsoas Quads Hamstring PF DF EHL  R 5 5 5 5 5 5   L 5 5 5 5 5 5    Reflexes are 1+ and symmetric at the biceps, triceps, brachioradialis, patella and achilles.   Hoffman's is absent.   Bilateral upper and lower extremity sensation is intact to light touch.    No evidence of dysmetria noted.  Gait is antalgic.     Medical Decision Making  Imaging: MRI L spine 04/09/2023 IMPRESSION: 1. At L3-L4, moderate to severe canal stenosis and mild-to-moderate right foraminal stenosis. 2. At L1-L2, moderate to severe right foraminal stenosis. 3. At L5-S1, moderate left and mild right foraminal stenosis. 4. At L4-L5, mild-to-moderate left and mild right foraminal stenosis. 5. At L2-L3, mild canal and right foraminal stenosis.     Electronically Signed   By: Feliberto Harts M.D.   On: 04/20/2023 08:21  L spine xray 10/2022 IMPRESSION: 1. No evidence of dynamic lumbar spine instability given the acquired degrees of flexion and extension. 2. Moderate to severe multilevel lumbar spine DDD, worse at L2-L3, L3-L4 and L4-L5. 3. Sequela previous SI joint fusion, incompletely evaluated.     Electronically Signed   By: Simonne Come M.D.   On: 03/24/2023 15:31  I have personally reviewed the images and agree with the above interpretation.  I have also reviewed his MRI in comparison to a CT abdomen pelvis from many years ago.  He has developed acquired scoliosis as evidenced by coronal plane abnormality that is present on his MRI scan that was not previously present.  Assessment and Plan: Mr. Riel is a pleasant 62 y.o. male with coronal plane abnormality,  acquired scoliosis, spondylolisthesis, and chronic back pain without sciatica.  He has lumbar stenosis causing neurogenic claudication.  He has tried physical therapy without improvement.  He is tried multiple different injections as well as medications without improvement.  At this point, no further conservative management is indicated.  I think he is likely to need surgical intervention.  However, I cannot tell the extent of his  scoliosis at this time.  To fully evaluate his scoliosis, I recommended scoliosis x-rays.  I will talk back with him after he has these performed.  I spent a total of 30 minutes in this patient's care today. This time was spent reviewing pertinent records including imaging studies, obtaining and confirming history, performing a directed evaluation, formulating and discussing my recommendations, and documenting the visit within the medical record.      Thank you for involving me in the care of this patient.      Malaya Cagley K. Myer Haff MD, [facility]

## 2023-07-02 ENCOUNTER — Encounter: Payer: Self-pay | Admitting: Neurosurgery

## 2023-07-02 ENCOUNTER — Ambulatory Visit: Payer: BC Managed Care – PPO | Admitting: Neurosurgery

## 2023-07-02 VITALS — BP 122/76 | Ht 67.0 in | Wt 206.2 lb

## 2023-07-02 DIAGNOSIS — M48062 Spinal stenosis, lumbar region with neurogenic claudication: Secondary | ICD-10-CM | POA: Diagnosis not present

## 2023-07-02 DIAGNOSIS — M419 Scoliosis, unspecified: Secondary | ICD-10-CM

## 2023-07-02 DIAGNOSIS — G8929 Other chronic pain: Secondary | ICD-10-CM

## 2023-07-02 DIAGNOSIS — M438X9 Other specified deforming dorsopathies, site unspecified: Secondary | ICD-10-CM

## 2023-07-02 DIAGNOSIS — M545 Low back pain, unspecified: Secondary | ICD-10-CM | POA: Diagnosis not present

## 2023-07-02 DIAGNOSIS — M4316 Spondylolisthesis, lumbar region: Secondary | ICD-10-CM | POA: Diagnosis not present

## 2023-07-03 ENCOUNTER — Ambulatory Visit
Admission: RE | Admit: 2023-07-03 | Discharge: 2023-07-03 | Disposition: A | Discharge status: Home / Self Care | Payer: BC Managed Care – PPO | Source: Ambulatory Visit | Attending: Neurosurgery | Admitting: Neurosurgery

## 2023-07-03 ENCOUNTER — Ambulatory Visit
Admission: RE | Admit: 2023-07-03 | Discharge: 2023-07-03 | Disposition: A | Discharge status: Home / Self Care | Payer: BC Managed Care – PPO | Attending: Neurosurgery | Admitting: Neurosurgery

## 2023-07-03 DIAGNOSIS — M48062 Spinal stenosis, lumbar region with neurogenic claudication: Secondary | ICD-10-CM

## 2023-07-03 DIAGNOSIS — G8929 Other chronic pain: Secondary | ICD-10-CM | POA: Insufficient documentation

## 2023-07-03 DIAGNOSIS — M545 Low back pain, unspecified: Secondary | ICD-10-CM

## 2023-07-03 DIAGNOSIS — M4316 Spondylolisthesis, lumbar region: Secondary | ICD-10-CM

## 2023-07-03 DIAGNOSIS — M419 Scoliosis, unspecified: Secondary | ICD-10-CM

## 2023-07-03 DIAGNOSIS — M438X9 Other specified deforming dorsopathies, site unspecified: Secondary | ICD-10-CM | POA: Diagnosis present

## 2023-07-04 ENCOUNTER — Encounter: Payer: Self-pay | Admitting: Neurosurgery

## 2023-07-04 ENCOUNTER — Ambulatory Visit: Payer: BC Managed Care – PPO | Admitting: Neurosurgery

## 2023-07-04 VITALS — BP 134/84 | Ht 67.0 in | Wt 206.0 lb

## 2023-07-04 DIAGNOSIS — M48062 Spinal stenosis, lumbar region with neurogenic claudication: Secondary | ICD-10-CM

## 2023-07-04 DIAGNOSIS — M4316 Spondylolisthesis, lumbar region: Secondary | ICD-10-CM

## 2023-07-04 DIAGNOSIS — M438X9 Other specified deforming dorsopathies, site unspecified: Secondary | ICD-10-CM | POA: Diagnosis not present

## 2023-07-04 DIAGNOSIS — M542 Cervicalgia: Secondary | ICD-10-CM

## 2023-07-04 DIAGNOSIS — M419 Scoliosis, unspecified: Secondary | ICD-10-CM | POA: Diagnosis not present

## 2023-07-04 DIAGNOSIS — G8929 Other chronic pain: Secondary | ICD-10-CM

## 2023-07-04 NOTE — H&P (View-Only) (Signed)
Referring Physician:  Cyndia Diver, PA-C [address] [place],  [place] [address]  Primary Physician:  Cyndia Diver, PA-C  History of Present Illness: 07/04/2023 Todd Madden is here to review his imaging.  07/02/2023 Todd Madden presents with two-year history of severe lower back pain. The pain is exacerbated by standing or walking, with the patient reporting an ability to stand for only a couple of minutes and walk approximately twenty to thirty yards before needing to stop due to intense discomfort. Sitting down reportedly alleviates the pain.  He has tried physical therapy approximately 1 year ago.  He has tried multiple different injections without improvement.  His wife notes that he stands stooped forward.  The patient has a history of a sacroiliac (SI) joint fusion, which unfortunately did not provide any relief. He also reports numbness in the shins, but denies any leg pain. The patient has undergone multiple interventions, including epidural injections and physical therapy, without significant improvement. A radiofrequency ablation procedure was performed approximately six to seven weeks prior to the consultation, but it was unsuccessful in alleviating the pain.  The patient also underwent a platelet-rich plasma (PRP) procedure, but it did not yield any significant improvement. The patient has a history of carpal tunnel syndrome in both wrists and has undergone knee replacement surgery. He reports that the back pain started after the knee replacement.  The patient has not noticed any significant loss of height or changes in gait, although he did use a heel lift in his left shoe for a while. He reports a localized area of decreased sensation on the right leg. The patient's quality of life has been significantly impacted by the persistent back pain, expressing frustration with the lack of improvement despite multiple interventions.   History of Present Illness: Notes from  Todd Charity, PA-C [name] is a 62 y.o. male who presents for follow up after RFA. He underwent a right lumbar RFA at [facility] 6 weeks ago without any relief.  He is still having significant low back pain with activity worse on the right than the left without any radiating leg pain, numbness, or tingling. In addition to his lumbar symptoms, he has had about 2 weeks of left-sided pain at the base of his neck that is worse with talking for prolonged periods of time or prolonged chewing.  He has tried acupuncture without any relief.   05/02/23 Todd Madden is a 62 year old presenting today via telephone visit to review his MRI and x-ray results.  He states that he has had 2 facet injections since our last visit which provided him with hours of relief.  He is waiting to hear about a radiofrequency nerve ablation.  He denies any changes to his symptoms.   03/19/23 Todd Madden is a 62 year old with a history of OSA, hypertension, hyperlipidemia, gout, prediabetes, and chronic lumbosacral complaints who is here today with a chief complaint of persistent right-sided low back pain.  He states that this started about a year and a half ago after he had his left knee replaced and has persisted since then.  He has undergone multiple conservative interventions including physical therapy at [facility], medication management, and multiple epidural steroid injections with minimal relief.  Today he describes pain that starts in his right low back.  This seems to be worse with standing for prolonged periods of time and walking and improved some with laying down.  He does have some mild numbness in his right anterior shin  but denies any radiating leg pain.  He denies any left-sided symptoms.  He was supposed to undergo RFA's with Dr. Renaldo Reel however he has postpone these as he is currently going through acupuncture although this is not helping.   Conservative measures: Acupuncture  Physical therapy:  has  participated at [facility] in June 2023 without relief Multimodal medical therapy including regular antiinflammatories:  advil, tylenol, tizanidine Injections:  has received epidural steroid injections 01/30/23: Right L3-4, L4-5 and L5-S1 facet - helped for a few hours the day of 4 interlaminar epidural steroid injections at L4-5 - no relief   Past Surgery: 11/20/22: Right SI fusion by Dr Yevette Edwards - did not help  Todd Madden has no symptoms of cervical myelopathy.  The symptoms are causing a significant impact on the patient's life.   I have utilized the care everywhere function in epic to review the outside records available from external health systems.  Review of Systems:  A 10 point review of systems is negative, except for the pertinent positives and negatives detailed in the HPI.  Past Medical History: Past Medical History:  Diagnosis Date   Actinic keratosis    Basal cell carcinoma 04/18/2020   Right post. shoulder. Superficial.    Basal cell carcinoma 08/16/2020   R mid back    Basal cell carcinoma 10/12/2020   right distal lat tricep above elbow, right distal lat tricep above elbow medial - EDC x 2   Basal cell carcinoma 12/05/2020   right ant lat deltoid, edc   Basal cell carcinoma 04/18/2022   R post deltoid, EDC   BCC (basal cell carcinoma of skin) 10/30/2022   R lat deltoid - ED&C   Skin cancer    unknown type - treated by Dr. Adolphus Birchwood     Past Surgical History: Past Surgical History:  Procedure Laterality Date    TENDON SHEATH INCISION (EG, FOR TRIGGER FINGER)-THUMB Right 09/06/2017   INJECTION(S); SINGLE TENDON ORIGIN/INSERTION mIddle finger Right 08/11/2019   KNEE ARTHROSCOPY     KNEE ARTHROSCOPY W/ MENISCECTOMY Left    NEUROPLASTY AND/OR TRANSPOSITION; MEDIAN NERVE AT CARPAL TUNNEL Left 06/05/2016   NEUROPLASTY AND/OR TRANSPOSITION; MEDIAN NERVE AT CARPAL TUNNEL Right 06/26/2016   OLECRANON BURSECTOMY     REPLACEMENT TOTAL KNEE Left    ROTATOR CUFF  REPAIR Left    ROTATOR CUFF REPAIR Right    TENDON SHEATH INCISION (EG, FOR TRIGGER FINGER) Middle Left 08/11/2019    Allergies: Allergies as of 07/04/2023   (No Known Allergies)    Medications:  Current Outpatient Medications:    allopurinol (ZYLOPRIM) 100 MG tablet, TAKE ONE TABLET BY MOUTH EVERY DAY, Disp: , Rfl:    Aspirin Buf,CaCarb-MgCarb-MgO, 81 MG TABS, Take by mouth., Disp: , Rfl:    atorvastatin (LIPITOR) 80 MG tablet, , Disp: , Rfl:    colchicine 0.6 MG tablet, TAKE 2 TABS @ ONSET OF FLARE FOLLOWED BY 1 TAB 1 HOUR LATER MAX OF 3 TABS IN 1 attack.  May repeat in 3 days, Disp: , Rfl:    fexofenadine (ALLEGRA) 180 MG tablet, Take by mouth., Disp: , Rfl:    hydrochlorothiazide (HYDRODIURIL) 12.5 MG tablet, Take by mouth., Disp: , Rfl:    lisinopril (ZESTRIL) 20 MG tablet, , Disp: , Rfl:    meloxicam (MOBIC) 15 MG tablet, Take 15 mg by mouth daily., Disp: , Rfl:    methocarbamol (ROBAXIN) 500 MG tablet, Take 1 tablet (500 mg total) by mouth every 8 (eight) hours as needed for  muscle spasms., Disp: 90 tablet, Rfl: 0   verapamil (CALAN-SR) 240 MG CR tablet, Take by mouth., Disp: , Rfl:    Vitamin D, Ergocalciferol, (DRISDOL) 1.25 MG (50000 UNIT) CAPS capsule, Take 1 capsule (50,000 Units total) by mouth every 7 (seven) days., Disp: 12 capsule, Rfl: 0  Social History: Social History   Tobacco Use   Smoking status: Never   Smokeless tobacco: Never  Substance Use Topics   Alcohol use: Yes    Alcohol/week: 6.0 - 8.0 standard drinks of alcohol    Types: 6 - 8 Cans of beer per week    Comment: 6-8 beers weekly   Drug use: Never    Family Medical History: No family history on file.  Physical Examination: Vitals:   07/04/23 1427  BP: 134/84    General: Patient is in no apparent distress. Attention to examination is appropriate.  Neck:   Supple.  Full range of motion.  Respiratory: Patient is breathing without any difficulty.   NEUROLOGICAL:     Awake, alert,  oriented to person, place, and time.  Speech is clear and fluent.   Cranial Nerves: Pupils equal round and reactive to light.  Facial tone is symmetric.  Facial sensation is symmetric. Shoulder shrug is symmetric. Tongue protrusion is midline.  There is no pronator drift.  Strength: Side Biceps Triceps Deltoid Interossei Grip Wrist Ext. Wrist Flex.  R 5 5 5 5 5 5 5   L 5 5 5 5 5 5 5    Side Iliopsoas Quads Hamstring PF DF EHL  R 5 5 5 5 5 5   L 5 5 5 5 5 5    Reflexes are 1+ and symmetric at the biceps, triceps, brachioradialis, patella and achilles.   Hoffman's is absent.   Bilateral upper and lower extremity sensation is intact to light touch.    No evidence of dysmetria noted.  Gait is antalgic.     Medical Decision Making  Imaging: MRI L spine 04/09/2023 IMPRESSION: 1. At L3-L4, moderate to severe canal stenosis and mild-to-moderate right foraminal stenosis. 2. At L1-L2, moderate to severe right foraminal stenosis. 3. At L5-S1, moderate left and mild right foraminal stenosis. 4. At L4-L5, mild-to-moderate left and mild right foraminal stenosis. 5. At L2-L3, mild canal and right foraminal stenosis.     Electronically Signed   By: Feliberto Harts M.D.   On: 04/20/2023 08:21  L spine xray 10/2022 IMPRESSION: 1. No evidence of dynamic lumbar spine instability given the acquired degrees of flexion and extension. 2. Moderate to severe multilevel lumbar spine DDD, worse at L2-L3, L3-L4 and L4-L5. 3. Sequela previous SI joint fusion, incompletely evaluated.     Electronically Signed   By: Simonne Come M.D.   On: 03/24/2023 15:31  Scoliosis x-rays from today show 36 degrees of lumbar lordosis with 53 degrees of pelvic incidence.  He has a spinal pelvic mismatch of 17 degrees.  He has a 23 degree coronal plane abnormality from the top of L1 to the bottom of L4.  This is primarily centered at the L2-3, L3-4, and L4-5 levels.  He has a leftward coronal plane imbalance.  There  is a compensatory curve in his thoracic spine.  His coronal plane imbalance is approximately 4.1 cm.  I have personally reviewed the images and agree with the above interpretation.  I have also reviewed his MRI in comparison to a CT abdomen pelvis from many years ago.  He has developed acquired scoliosis as evidenced by coronal plane abnormality  that is present on his MRI scan that was not previously present.  Assessment and Plan: Mr. Raper is a pleasant 62 y.o. male with coronal plane abnormality, acquired scoliosis, spondylolisthesis, and chronic back pain without sciatica.  He has lumbar stenosis causing neurogenic claudication.  He is also having some neck pain.  We focused on his back issues.  He has tried physical therapy without improvement.  He has tried multiple different injections as well as medications without improvement.  At this point, no further conservative management is indicated.    I have recommended a significant scoliosis correction.  He has spondylolisthesis as well as coronal and sagittal plane abnormalities.  He has spinal pelvic mismatch of 15 degrees.  I have recommended L2-5 lateral lumbar interbody fusion followed by L2-S2 posterior spinal instrumentation with L5-S1 transforaminal lumbar interbody fusion.    I discussed the planned procedure at length with the patient, including the risks, benefits, alternatives, and indications. The risks discussed include but are not limited to bleeding, infection, need for reoperation, spinal fluid leak, stroke, vision loss, anesthetic complication, coma, paralysis, and even death. I also described in detail that improvement was not guaranteed.  The patient expressed understanding of these risks, and asked that we proceed with surgery. I described the surgery in layman's terms, and gave ample opportunity for questions, which were answered to the best of my ability.   I spent a total of 30 minutes in this patient's care today. This  time was spent reviewing pertinent records including imaging studies, obtaining and confirming history, performing a directed evaluation, formulating and discussing my recommendations, and documenting the visit within the medical record.      Thank you for involving me in the care of this patient.      Luisantonio Adinolfi K. Myer Haff MD, [facility] Neurosurgery

## 2023-07-04 NOTE — Patient Instructions (Signed)
Please see below for information in regards to your upcoming surgery:   Planned surgery: L2-5 lateral lumbar interbody fusion, L5-S1 transforaminal lumbar interbody fusion, L2-S2 posterior spinal fusion   Surgery date: 07/29/23 at [facility] (Medical Mall: [address] [place], [place] [address]) - you will find out your arrival time the business day before your surgery.   Pre-op appointment at [facility] Pre-admit Testing: we will call you with a date/time for this. If you are scheduled for an in person appointment, Pre-admit Testing is located on the first floor of the [facility] building, [address], [address]. Please bring all prescriptions in the original prescription bottles to your appointment. During this appointment, they will advise you which medications you can take the morning of surgery, and which medications you will need to hold for surgery. Labs (such as blood work, EKG) may be done at your pre-op appointment. You are not required to fast for these labs. Should you need to change your pre-op appointment, please call Pre-admit testing at [phone].     Blood thinners:   Aspirin:  stop aspirin 7 days prior, resume aspirin 14 days after     Surgical clearance: we will send a clearance form to [employer], PA-C. They may wish to see you in their office prior to signing the clearance form. If so, they may call you to schedule an appointment.     Brace: You will need to bring the brace to the hospital on the day of surgery. [facility] will contact you regarding an appointment for the brace you will use after surgery. If it is getting close to your surgery date and you have not received an appointment with [facility], please reach out to [place].  Their number is [phone] should you miss their call or have an issue with your brace after surgery.     NSAIDS (Non-steroidal anti-inflammatory drugs): because you are having a  fusion, please avoid taking any NSAIDS (examples: ibuprofen, motrin, aleve, naproxen, meloxicam, diclofenac) for 3 months after surgery. Celebrex is an exception and is OK to take, if prescribed. Tylenol is not an NSAID.    Common restrictions after surgery: No bending, lifting, or twisting ("BLT"). Avoid lifting objects heavier than 10 pounds for the first 6 weeks after surgery. Where possible, avoid household activities that involve lifting, bending, reaching, pushing, or pulling such as laundry, vacuuming, grocery shopping, and childcare. Try to arrange for help from friends and family for these activities while you heal. Do not drive while taking prescription pain medication. Weeks 6 through 12 after surgery: avoid lifting more than 25 pounds.    X-rays after surgery: Because you are having a fusion: for appointments after your 2 week follow-up: please arrive at the [facility] outpatient imaging center ([address], [place]) or [facility] one hour prior to your appointment for x-rays. This applies to every appointment after your 2 week follow-up. Failure to do so may result in your appointment being rescheduled.   How to contact us:  If you have any questions/concerns before or after surgery, you can reach [place] at [phone], or you can send a mychart message. We can be reached by phone or mychart 8am-4pm, Monday-Friday.  *Please note: Calls after 4pm are forwarded to a third party answering service. Mychart messages are not routinely monitored during evenings, weekends, and holidays. Please call our office to contact the answering service for urgent concerns during non-business hours.  Appointments/FMLA & disability paperwork: Joycelyn Rua, & Flonnie Hailstone Registered Nurse/Surgery scheduler: Royston Cowper Medical Assistants: Nash Mantis Physician Assistants: [name], PA-C & Drake Leach, PA-C Surgeons: Venetia Night, MD & Ernestine Mcmurray, MD

## 2023-07-04 NOTE — Progress Notes (Signed)
Referring Physician:  Cyndia Diver, PA-C [address] [place],  [place] [address]  Primary Physician:  Cyndia Diver, PA-C  History of Present Illness: 07/04/2023 Todd Madden is here to review his imaging.  07/02/2023 Todd Madden presents with two-year history of severe lower back pain. The pain is exacerbated by standing or walking, with the patient reporting an ability to stand for only a couple of minutes and walk approximately twenty to thirty yards before needing to stop due to intense discomfort. Sitting down reportedly alleviates the pain.  He has tried physical therapy approximately 1 year ago.  He has tried multiple different injections without improvement.  His wife notes that he stands stooped forward.  The patient has a history of a sacroiliac (SI) joint fusion, which unfortunately did not provide any relief. He also reports numbness in the shins, but denies any leg pain. The patient has undergone multiple interventions, including epidural injections and physical therapy, without significant improvement. A radiofrequency ablation procedure was performed approximately six to seven weeks prior to the consultation, but it was unsuccessful in alleviating the pain.  The patient also underwent a platelet-rich plasma (PRP) procedure, but it did not yield any significant improvement. The patient has a history of carpal tunnel syndrome in both wrists and has undergone knee replacement surgery. He reports that the back pain started after the knee replacement.  The patient has not noticed any significant loss of height or changes in gait, although he did use a heel lift in his left shoe for a while. He reports a localized area of decreased sensation on the right leg. The patient's quality of life has been significantly impacted by the persistent back pain, expressing frustration with the lack of improvement despite multiple interventions.   History of Present Illness: Notes from  [name], PA-C [name] is a 62 y.o. male who presents for follow up after RFA. He underwent a right lumbar RFA at [facility] 6 weeks ago without any relief.  He is still having significant low back pain with activity worse on the right than the left without any radiating leg pain, numbness, or tingling. In addition to his lumbar symptoms, he has had about 2 weeks of left-sided pain at the base of his neck that is worse with talking for prolonged periods of time or prolonged chewing.  He has tried acupuncture without any relief.   05/02/23 Todd Madden is a 62 year old presenting today via telephone visit to review his MRI and x-ray results.  He states that he has had 2 facet injections since our last visit which provided him with hours of relief.  He is waiting to hear about a radiofrequency nerve ablation.  He denies any changes to his symptoms.   03/19/23 Todd Madden is a 62 year old with a history of OSA, hypertension, hyperlipidemia, gout, prediabetes, and chronic lumbosacral complaints who is here today with a chief complaint of persistent right-sided low back pain.  He states that this started about a year and a half ago after he had his left knee replaced and has persisted since then.  He has undergone multiple conservative interventions including physical therapy at [facility], medication management, and multiple epidural steroid injections with minimal relief.  Today he describes pain that starts in his right low back.  This seems to be worse with standing for prolonged periods of time and walking and improved some with laying down.  He does have some mild numbness in his right anterior shin  but denies any radiating leg pain.  He denies any left-sided symptoms.  He was supposed to undergo RFA's with Dr. Renaldo Reel however he has postpone these as he is currently going through acupuncture although this is not helping.   Conservative measures: Acupuncture  Physical therapy:  has  participated at [facility] in June 2023 without relief Multimodal medical therapy including regular antiinflammatories:  advil, tylenol, tizanidine Injections:  has received epidural steroid injections 01/30/23: Right L3-4, L4-5 and L5-S1 facet - helped for a few hours the day of 4 interlaminar epidural steroid injections at L4-5 - no relief   Past Surgery: 11/20/22: Right SI fusion by Dr Yevette Edwards - did not help  Todd Madden has no symptoms of cervical myelopathy.  The symptoms are causing a significant impact on the patient's life.   I have utilized the care everywhere function in epic to review the outside records available from external health systems.  Review of Systems:  A 10 point review of systems is negative, except for the pertinent positives and negatives detailed in the HPI.  Past Medical History: Past Medical History:  Diagnosis Date   Actinic keratosis    Basal cell carcinoma 04/18/2020   Right post. shoulder. Superficial.    Basal cell carcinoma 08/16/2020   R mid back    Basal cell carcinoma 10/12/2020   right distal lat tricep above elbow, right distal lat tricep above elbow medial - EDC x 2   Basal cell carcinoma 12/05/2020   right ant lat deltoid, edc   Basal cell carcinoma 04/18/2022   R post deltoid, EDC   BCC (basal cell carcinoma of skin) 10/30/2022   R lat deltoid - ED&C   Skin cancer    unknown type - treated by Dr. Adolphus Birchwood     Past Surgical History: Past Surgical History:  Procedure Laterality Date    TENDON SHEATH INCISION (EG, FOR TRIGGER FINGER)-THUMB Right 09/06/2017   INJECTION(S); SINGLE TENDON ORIGIN/INSERTION mIddle finger Right 08/11/2019   KNEE ARTHROSCOPY     KNEE ARTHROSCOPY W/ MENISCECTOMY Left    NEUROPLASTY AND/OR TRANSPOSITION; MEDIAN NERVE AT CARPAL TUNNEL Left 06/05/2016   NEUROPLASTY AND/OR TRANSPOSITION; MEDIAN NERVE AT CARPAL TUNNEL Right 06/26/2016   OLECRANON BURSECTOMY     REPLACEMENT TOTAL KNEE Left    ROTATOR CUFF  REPAIR Left    ROTATOR CUFF REPAIR Right    TENDON SHEATH INCISION (EG, FOR TRIGGER FINGER) Middle Left 08/11/2019    Allergies: Allergies as of 07/04/2023   (No Known Allergies)    Medications:  Current Outpatient Medications:    allopurinol (ZYLOPRIM) 100 MG tablet, TAKE ONE TABLET BY MOUTH EVERY DAY, Disp: , Rfl:    Aspirin Buf,CaCarb-MgCarb-MgO, 81 MG TABS, Take by mouth., Disp: , Rfl:    atorvastatin (LIPITOR) 80 MG tablet, , Disp: , Rfl:    colchicine 0.6 MG tablet, TAKE 2 TABS @ ONSET OF FLARE FOLLOWED BY 1 TAB 1 HOUR LATER MAX OF 3 TABS IN 1 attack.  May repeat in 3 days, Disp: , Rfl:    fexofenadine (ALLEGRA) 180 MG tablet, Take by mouth., Disp: , Rfl:    hydrochlorothiazide (HYDRODIURIL) 12.5 MG tablet, Take by mouth., Disp: , Rfl:    lisinopril (ZESTRIL) 20 MG tablet, , Disp: , Rfl:    meloxicam (MOBIC) 15 MG tablet, Take 15 mg by mouth daily., Disp: , Rfl:    methocarbamol (ROBAXIN) 500 MG tablet, Take 1 tablet (500 mg total) by mouth every 8 (eight) hours as needed for  muscle spasms., Disp: 90 tablet, Rfl: 0   verapamil (CALAN-SR) 240 MG CR tablet, Take by mouth., Disp: , Rfl:    Vitamin D, Ergocalciferol, (DRISDOL) 1.25 MG (50000 UNIT) CAPS capsule, Take 1 capsule (50,000 Units total) by mouth every 7 (seven) days., Disp: 12 capsule, Rfl: 0  Social History: Social History   Tobacco Use   Smoking status: Never   Smokeless tobacco: Never  Substance Use Topics   Alcohol use: Yes    Alcohol/week: 6.0 - 8.0 standard drinks of alcohol    Types: 6 - 8 Cans of beer per week    Comment: 6-8 beers weekly   Drug use: Never    Family Medical History: No family history on file.  Physical Examination: Vitals:   07/04/23 1427  BP: 134/84    General: Patient is in no apparent distress. Attention to examination is appropriate.  Neck:   Supple.  Full range of motion.  Respiratory: Patient is breathing without any difficulty.   NEUROLOGICAL:     Awake, alert,  oriented to person, place, and time.  Speech is clear and fluent.   Cranial Nerves: Pupils equal round and reactive to light.  Facial tone is symmetric.  Facial sensation is symmetric. Shoulder shrug is symmetric. Tongue protrusion is midline.  There is no pronator drift.  Strength: Side Biceps Triceps Deltoid Interossei Grip Wrist Ext. Wrist Flex.  R 5 5 5 5 5 5 5   L 5 5 5 5 5 5 5    Side Iliopsoas Quads Hamstring PF DF EHL  R 5 5 5 5 5 5   L 5 5 5 5 5 5    Reflexes are 1+ and symmetric at the biceps, triceps, brachioradialis, patella and achilles.   Hoffman's is absent.   Bilateral upper and lower extremity sensation is intact to light touch.    No evidence of dysmetria noted.  Gait is antalgic.     Medical Decision Making  Imaging: MRI L spine 04/09/2023 IMPRESSION: 1. At L3-L4, moderate to severe canal stenosis and mild-to-moderate right foraminal stenosis. 2. At L1-L2, moderate to severe right foraminal stenosis. 3. At L5-S1, moderate left and mild right foraminal stenosis. 4. At L4-L5, mild-to-moderate left and mild right foraminal stenosis. 5. At L2-L3, mild canal and right foraminal stenosis.     Electronically Signed   By: Feliberto Harts M.D.   On: 04/20/2023 08:21  L spine xray 10/2022 IMPRESSION: 1. No evidence of dynamic lumbar spine instability given the acquired degrees of flexion and extension. 2. Moderate to severe multilevel lumbar spine DDD, worse at L2-L3, L3-L4 and L4-L5. 3. Sequela previous SI joint fusion, incompletely evaluated.     Electronically Signed   By: Simonne Come M.D.   On: 03/24/2023 15:31  Scoliosis x-rays from today show 36 degrees of lumbar lordosis with 53 degrees of pelvic incidence.  He has a spinal pelvic mismatch of 17 degrees.  He has a 23 degree coronal plane abnormality from the top of L1 to the bottom of L4.  This is primarily centered at the L2-3, L3-4, and L4-5 levels.  He has a leftward coronal plane imbalance.  There  is a compensatory curve in his thoracic spine.  His coronal plane imbalance is approximately 4.1 cm.  I have personally reviewed the images and agree with the above interpretation.  I have also reviewed his MRI in comparison to a CT abdomen pelvis from many years ago.  He has developed acquired scoliosis as evidenced by coronal plane abnormality  that is present on his MRI scan that was not previously present.  Assessment and Plan: Todd Madden is a pleasant 62 y.o. male with coronal plane abnormality, acquired scoliosis, spondylolisthesis, and chronic back pain without sciatica.  He has lumbar stenosis causing neurogenic claudication.  He is also having some neck pain.  We focused on his back issues.  He has tried physical therapy without improvement.  He has tried multiple different injections as well as medications without improvement.  At this point, no further conservative management is indicated.    I have recommended a significant scoliosis correction.  He has spondylolisthesis as well as coronal and sagittal plane abnormalities.  He has spinal pelvic mismatch of 15 degrees.  I have recommended L2-5 lateral lumbar interbody fusion followed by L2-S2 posterior spinal instrumentation with L5-S1 transforaminal lumbar interbody fusion.    I discussed the planned procedure at length with the patient, including the risks, benefits, alternatives, and indications. The risks discussed include but are not limited to bleeding, infection, need for reoperation, spinal fluid leak, stroke, vision loss, anesthetic complication, coma, paralysis, and even death. I also described in detail that improvement was not guaranteed.  The patient expressed understanding of these risks, and asked that we proceed with surgery. I described the surgery in layman's terms, and gave ample opportunity for questions, which were answered to the best of my ability.   I spent a total of 30 minutes in this patient's care today. This  time was spent reviewing pertinent records including imaging studies, obtaining and confirming history, performing a directed evaluation, formulating and discussing my recommendations, and documenting the visit within the medical record.      Thank you for involving me in the care of this patient.      Todd Madden K. Myer Haff MD, [facility] Neurosurgery

## 2023-07-08 ENCOUNTER — Other Ambulatory Visit: Payer: Self-pay

## 2023-07-08 DIAGNOSIS — Z01818 Encounter for other preprocedural examination: Secondary | ICD-10-CM

## 2023-07-17 ENCOUNTER — Other Ambulatory Visit: Payer: Self-pay

## 2023-07-17 ENCOUNTER — Encounter
Admission: RE | Admit: 2023-07-17 | Discharge: 2023-07-17 | Disposition: A | Discharge status: Home / Self Care | Payer: BC Managed Care – PPO | Source: Ambulatory Visit | Attending: Neurosurgery | Admitting: Neurosurgery

## 2023-07-17 VITALS — BP 136/84 | HR 61 | Resp 16 | Ht 67.0 in | Wt 203.0 lb

## 2023-07-17 DIAGNOSIS — Z22322 Carrier or suspected carrier of Methicillin resistant Staphylococcus aureus: Secondary | ICD-10-CM

## 2023-07-17 DIAGNOSIS — Z01818 Encounter for other preprocedural examination: Secondary | ICD-10-CM

## 2023-07-17 DIAGNOSIS — Z01812 Encounter for preprocedural laboratory examination: Secondary | ICD-10-CM | POA: Diagnosis present

## 2023-07-17 HISTORY — DX: Spinal stenosis, lumbar region with neurogenic claudication: M48.062

## 2023-07-17 HISTORY — DX: Carrier or suspected carrier of methicillin resistant Staphylococcus aureus: Z22.322

## 2023-07-17 HISTORY — DX: Other chronic pain: G89.29

## 2023-07-17 HISTORY — DX: Spondylolisthesis, site unspecified: M43.10

## 2023-07-17 HISTORY — DX: Scoliosis, unspecified: M41.9

## 2023-07-17 LAB — URINALYSIS, COMPLETE (UACMP) WITH MICROSCOPIC
Bacteria, UA: NONE SEEN
Bilirubin Urine: NEGATIVE
Glucose, UA: NEGATIVE mg/dL
Hgb urine dipstick: NEGATIVE
Ketones, ur: NEGATIVE mg/dL
Leukocytes,Ua: NEGATIVE
Nitrite: NEGATIVE
Protein, ur: NEGATIVE mg/dL
Specific Gravity, Urine: 1.015 (ref 1.005–1.030)
Squamous Epithelial / HPF: 0 /[HPF] (ref 0–5)
WBC, UA: 0 WBC/hpf (ref 0–5)
pH: 6 (ref 5.0–8.0)

## 2023-07-17 LAB — TYPE AND SCREEN
ABO/RH(D): A POS
Antibody Screen: NEGATIVE

## 2023-07-17 LAB — SURGICAL PCR SCREEN
MRSA, PCR: POSITIVE — AB
Staphylococcus aureus: POSITIVE — AB

## 2023-07-17 NOTE — Progress Notes (Signed)
  Perioperative Services  Abnormal Lab Notification and Treatment Plan of Care  Date: 07/17/23  Name: Todd Madden MRN:   [id number]  Re: Abnormal labs noted during PAT appointment  Provider Notified: Venetia Night, MD Notification mode: Routed and/or faxed via [facility]  Labs of concern: Lab Results  Component Value Date   STAPHAUREUS POSITIVE (A) 07/17/2023   MRSAPCR POSITIVE (A) 07/17/2023    Notes: Patient is scheduled for a L2-5 LATERAL LUMBAR INTERBODY FUSION; OPEN L5-S1 TRANSFORAMINAL LUMBAR INTERBODY FUSION (TLIF); L2-S2 POSTERIOR SPINAL FUSION on 07/29/2023.   Pre-surgical PCR (+) for MRSA; see above. Given the nature of his procedure, dual coverage with CEFAZOLIN + VANCOMYCIN has been already been ordered to be given pre-operatively.   PLANS:  Reviewed renal function. BUN 22 mg/dL and creatinine 0.9 mg/dL. eGFR 97.  Verified that patient has orders in place for both CEFAZOLIN + VANCOMYCIN to be given in the setting of documented MRSA (+) surgical PCR.   Guidelines suggest that a beta-lactam antibiotic (first or second generation cephalosporin) should be added for activity against gram-negative organisms.  Vancomycin appears to be less effective than cefazolin for preventing SSIs caused by MSSA. For this reason, the use of vancomycin in combination with cefazolin is favored for prevention of SSI due to MRSA and coagulase-negative staphylococci.  Medical history in [facility] updated to reflect (+) PCR result indicating nasal MRSA colonization   Quentin Mulling, MSN, APRN, FNP-C, CEN [facility]  Perioperative Services Nurse Practitioner Phone: [phone] 07/17/23 7:05 PM

## 2023-07-17 NOTE — Patient Instructions (Addendum)
Your procedure is scheduled on: 07/29/23 - Monday Report to the Registration Desk on the 1st floor of the [facility]. To find out your arrival time, please call [phone] between 1PM - 3PM on: 07/26/23 - Friday If your arrival time is 6:00 am, do not arrive before that time as the Medical Mall entrance doors do not open until 6:00 am.  REMEMBER: Instructions that are not followed completely may result in serious medical risk, up to and including death; or upon the discretion of your surgeon and anesthesiologist your surgery may need to be rescheduled.  Do not eat food after midnight the night before surgery.  No gum chewing or hard candies.  You may however, drink CLEAR liquids up to 2 hours before you are scheduled to arrive for your surgery. Do not drink anything within 2 hours of your scheduled arrival time.  Clear liquids include: - water  - apple juice without pulp - gatorade (not RED colors) - black coffee or tea (Do NOT add milk or creamers to the coffee or tea) Do NOT drink anything that is not on this list.  One week prior to surgery: You may continue taking  Anti-inflammatories (NSAIDS) such as Advil, Aleve, Ibuprofen, Motrin, Naproxen, Naprosyn . You may take Tylenol if needed for pain up until the day of surgery.  Stop taking beginning 07/22/23, ANY OVER THE COUNTER supplements until after surgery.  HOLD lisinopril (ZESTRIL) on the day of surgery.  You  have been instructed the patient to hold their blood thinner for surgery as listed below: Blood thinners:   Aspirin:  stop aspirin 7 days prior, resume aspirin 14 days after   ON THE DAY OF SURGERY ONLY TAKE THESE MEDICATIONS WITH SIPS OF WATER:  allopurinol (ZYLOPRIM)  atorvastatin (LIPITOR)  verapamil (CALAN-SR)    No Alcohol for 24 hours before or after surgery.  No Smoking including e-cigarettes for 24 hours before surgery.  No chewable tobacco products for at least 6 hours before surgery.  No nicotine  patches on the day of surgery.  Do not use any "recreational" drugs for at least a week (preferably 2 weeks) before your surgery.  Please be advised that the combination of cocaine and anesthesia may have negative outcomes, up to and including death. If you test positive for cocaine, your surgery will be cancelled.  On the morning of surgery brush your teeth with toothpaste and water, you may rinse your mouth with mouthwash if you wish. Do not swallow any toothpaste or mouthwash.  Use CHG Soap or wipes as directed on instruction sheet.  Do not wear jewelry, make-up, hairpins, clips or nail polish.  For welded (permanent) jewelry: bracelets, anklets, waist bands, etc.  Please have this removed prior to surgery.  If it is not removed, there is a chance that hospital personnel will need to cut it off on the day of surgery.  Do not wear lotions, powders, or perfumes.   Do not shave body hair from the neck down 48 hours before surgery.  Contact lenses, hearing aids and dentures may not be worn into surgery.  Do not bring valuables to the hospital. [facility] is not responsible for any missing/lost belongings or valuables.   Notify your doctor if there is any change in your medical condition (cold, fever, infection).  Wear comfortable clothing (specific to your surgery type) to the hospital.  After surgery, you can help prevent lung complications by doing breathing exercises.  Take deep breaths and cough every 1-2  hours. Your doctor may order a device called an Incentive Spirometer to help you take deep breaths. When coughing or sneezing, hold a pillow firmly against your incision with both hands. This is called "splinting." Doing this helps protect your incision. It also decreases belly discomfort.  If you are being admitted to the hospital overnight, leave your suitcase in the car. After surgery it may be brought to your room.  In case of increased patient census, it may be necessary  for you, the patient, to continue your postoperative care in the Same Day Surgery department.  If you are being discharged the day of surgery, you will not be allowed to drive home. You will need a responsible individual to drive you home and stay with you for 24 hours after surgery.   If you are taking public transportation, you will need to have a responsible individual with you.  Please call the Pre-admissions Testing Dept. at [phone] if you have any questions about these instructions.  Surgery Visitation Policy:  Patients having surgery or a procedure may have two visitors.  Children under the age of 65 must have an adult with them who is not the patient.  Inpatient Visitation:    Visiting hours are 7 a.m. to 8 p.m. Up to four visitors are allowed at one time in a patient room. The visitors may rotate out with other people during the day.  One visitor age 71 or older may stay with the patient overnight and must be in the room by 8 p.m.     Pre-operative 5 CHG Bath Instructions   You can play a key role in reducing the risk of infection after surgery. Your skin needs to be as free of germs as possible. You can reduce the number of germs on your skin by washing with CHG (chlorhexidine gluconate) soap before surgery. CHG is an antiseptic soap that kills germs and continues to kill germs even after washing.   DO NOT use if you have an allergy to chlorhexidine/CHG or antibacterial soaps. If your skin becomes reddened or irritated, stop using the CHG and notify one of our RNs at [phone].   Please shower with the CHG soap starting 4 days before surgery using the following schedule: 07/25/23 - 07/29/23.    Please keep in mind the following:  DO NOT shave, including legs and underarms, starting the day of your first shower.   You may shave your face at any point before/day of surgery.  Place clean sheets on your bed the day you start using CHG soap. Use a clean washcloth  (not used since being washed) for each shower. DO NOT sleep with pets once you start using the CHG.   CHG Shower Instructions:  If you choose to wash your hair and private area, wash first with your normal shampoo/soap.  After you use shampoo/soap, rinse your hair and body thoroughly to remove shampoo/soap residue.  Turn the water OFF and apply about 3 tablespoons (45 ml) of CHG soap to a CLEAN washcloth.  Apply CHG soap ONLY FROM YOUR NECK DOWN TO YOUR TOES (washing for 3-5 minutes)  DO NOT use CHG soap on face, private areas, open wounds, or sores.  Pay special attention to the area where your surgery is being performed.  If you are having back surgery, having someone wash your back for you may be helpful. Wait 2 minutes after CHG soap is applied, then you may rinse off the CHG soap.  Pat dry with  a clean towel  Put on clean clothes/pajamas   If you choose to wear lotion, please use ONLY the CHG-compatible lotions on the back of this paper.     Additional instructions for the day of surgery: DO NOT APPLY any lotions, deodorants, cologne, or perfumes.   Put on clean/comfortable clothes.  Brush your teeth.  Ask your nurse before applying any prescription medications to the skin.      CHG Compatible Lotions   Aveeno Moisturizing lotion  Cetaphil Moisturizing Cream  Cetaphil Moisturizing Lotion  Clairol Herbal Essence Moisturizing Lotion, Dry Skin  Clairol Herbal Essence Moisturizing Lotion, Extra Dry Skin  Clairol Herbal Essence Moisturizing Lotion, Normal Skin  Curel Age Defying Therapeutic Moisturizing Lotion with Alpha Hydroxy  Curel Extreme Care Body Lotion  Curel Soothing Hands Moisturizing Hand Lotion  Curel Therapeutic Moisturizing Cream, Fragrance-Free  Curel Therapeutic Moisturizing Lotion, Fragrance-Free  Curel Therapeutic Moisturizing Lotion, Original Formula  Eucerin Daily Replenishing Lotion  Eucerin Dry Skin Therapy Plus Alpha Hydroxy Crme  Eucerin Dry Skin  Therapy Plus Alpha Hydroxy Lotion  Eucerin Original Crme  Eucerin Original Lotion  Eucerin Plus Crme Eucerin Plus Lotion  Eucerin TriLipid Replenishing Lotion  Keri Anti-Bacterial Hand Lotion  Keri Deep Conditioning Original Lotion Dry Skin Formula Softly Scented  Keri Deep Conditioning Original Lotion, Fragrance Free Sensitive Skin Formula  Keri Lotion Fast Absorbing Fragrance Free Sensitive Skin Formula  Keri Lotion Fast Absorbing Softly Scented Dry Skin Formula  Keri Original Lotion  Keri Skin Renewal Lotion Keri Silky Smooth Lotion  Keri Silky Smooth Sensitive Skin Lotion  Nivea Body Creamy Conditioning Oil  Nivea Body Extra Enriched Teacher, adult education Moisturizing Lotion Nivea Crme  Nivea Skin Firming Lotion  NutraDerm 30 Skin Lotion  NutraDerm Skin Lotion  NutraDerm Therapeutic Skin Cream  NutraDerm Therapeutic Skin Lotion  ProShield Protective Hand Cream  Provon moisturizing lotion

## 2023-07-26 ENCOUNTER — Telehealth: Payer: Self-pay

## 2023-07-26 NOTE — Telephone Encounter (Signed)
I spoke with Mr Ezzell. He was putting up [employer] yesterday and he stepped wrong off the ladder and all of his weight went on his left leg. Since then he is having pain/soreness in his left groin, causing him to limp.   He reports he also scraped his shin on the ladder, which removed some of the skin. He is already putting neosporin and a bandaid on this. I advised him to keep the area clean and if it looks worse, to seek treatment at an urgent care. He doesn't feel it is bad enough to seek treatment.  He is taking tylenol for his groin pain. I encouraged him to also try heat and/or ice. I also reminded him that Dr Myer Haff is generally OK with NSAIDS like ibuprofen prior to surgery, but not to take any the morning of surgery.

## 2023-07-26 NOTE — Telephone Encounter (Signed)
I spoke with Mr Donald and relayed Dr Lucienne Capers response.

## 2023-07-26 NOTE — Telephone Encounter (Signed)
I spoke with Mr Sgro. See telephone call 07/26/23.

## 2023-07-28 MED ORDER — VANCOMYCIN HCL IN DEXTROSE 1-5 GM/200ML-% IV SOLN
1000.0000 mg | Freq: Once | INTRAVENOUS | Status: AC
  Administered 2023-07-29: 1000 mg via INTRAVENOUS

## 2023-07-28 MED ORDER — LACTATED RINGERS IV SOLN: INTRAVENOUS | Status: DC

## 2023-07-28 MED ORDER — CHLORHEXIDINE GLUCONATE 0.12 % MT SOLN
15.0000 mL | Freq: Once | OROMUCOSAL | Status: AC
  Administered 2023-07-29: 15 mL via OROMUCOSAL

## 2023-07-28 MED ORDER — ORAL CARE MOUTH RINSE: 15.0000 mL | Freq: Once | OROMUCOSAL | Status: AC

## 2023-07-28 MED ORDER — CEFAZOLIN SODIUM-DEXTROSE 2-4 GM/100ML-% IV SOLN
2.0000 g | INTRAVENOUS | Status: AC
  Administered 2023-07-29 (×2): 2 g via INTRAVENOUS

## 2023-07-28 MED ORDER — CEFAZOLIN IN SODIUM CHLORIDE 2-0.9 GM/100ML-% IV SOLN
2.0000 g | Freq: Once | INTRAVENOUS | Status: DC
  Filled 2023-07-28: qty 100

## 2023-07-29 ENCOUNTER — Inpatient Hospital Stay: Payer: BC Managed Care – PPO

## 2023-07-29 ENCOUNTER — Inpatient Hospital Stay: Payer: BC Managed Care – PPO | Admitting: Urgent Care

## 2023-07-29 ENCOUNTER — Inpatient Hospital Stay
Admission: RE | Admit: 2023-07-29 | Discharge: 2023-08-02 | DRG: 427 | Disposition: A | Discharge status: Home Health | Payer: BC Managed Care – PPO | Attending: Neurosurgery | Admitting: Neurosurgery

## 2023-07-29 ENCOUNTER — Other Ambulatory Visit: Payer: Self-pay

## 2023-07-29 ENCOUNTER — Encounter
Admission: RE | Disposition: A | Discharge status: Home Health | Payer: Self-pay | Source: Home / Self Care | Attending: Neurosurgery

## 2023-07-29 ENCOUNTER — Encounter: Payer: Self-pay | Admitting: Neurosurgery

## 2023-07-29 DIAGNOSIS — Z85828 Personal history of other malignant neoplasm of skin: Secondary | ICD-10-CM

## 2023-07-29 DIAGNOSIS — M5416 Radiculopathy, lumbar region: Secondary | ICD-10-CM | POA: Diagnosis present

## 2023-07-29 DIAGNOSIS — M4316 Spondylolisthesis, lumbar region: Secondary | ICD-10-CM

## 2023-07-29 DIAGNOSIS — I10 Essential (primary) hypertension: Secondary | ICD-10-CM | POA: Diagnosis present

## 2023-07-29 DIAGNOSIS — M4186 Other forms of scoliosis, lumbar region: Principal | ICD-10-CM | POA: Diagnosis present

## 2023-07-29 DIAGNOSIS — D62 Acute posthemorrhagic anemia: Secondary | ICD-10-CM | POA: Diagnosis not present

## 2023-07-29 DIAGNOSIS — Z791 Long term (current) use of non-steroidal anti-inflammatories (NSAID): Secondary | ICD-10-CM

## 2023-07-29 DIAGNOSIS — M419 Scoliosis, unspecified: Secondary | ICD-10-CM | POA: Diagnosis present

## 2023-07-29 DIAGNOSIS — M109 Gout, unspecified: Secondary | ICD-10-CM | POA: Diagnosis present

## 2023-07-29 DIAGNOSIS — R7303 Prediabetes: Secondary | ICD-10-CM | POA: Diagnosis present

## 2023-07-29 DIAGNOSIS — G8929 Other chronic pain: Secondary | ICD-10-CM

## 2023-07-29 DIAGNOSIS — E785 Hyperlipidemia, unspecified: Secondary | ICD-10-CM | POA: Diagnosis present

## 2023-07-29 DIAGNOSIS — Z7982 Long term (current) use of aspirin: Secondary | ICD-10-CM | POA: Diagnosis not present

## 2023-07-29 DIAGNOSIS — G4733 Obstructive sleep apnea (adult) (pediatric): Secondary | ICD-10-CM | POA: Diagnosis present

## 2023-07-29 DIAGNOSIS — I9581 Postprocedural hypotension: Secondary | ICD-10-CM | POA: Diagnosis not present

## 2023-07-29 DIAGNOSIS — Z79899 Other long term (current) drug therapy: Secondary | ICD-10-CM | POA: Diagnosis not present

## 2023-07-29 DIAGNOSIS — M48062 Spinal stenosis, lumbar region with neurogenic claudication: Secondary | ICD-10-CM | POA: Diagnosis present

## 2023-07-29 DIAGNOSIS — M438X9 Other specified deforming dorsopathies, site unspecified: Secondary | ICD-10-CM | POA: Diagnosis not present

## 2023-07-29 DIAGNOSIS — M5441 Lumbago with sciatica, right side: Secondary | ICD-10-CM | POA: Diagnosis not present

## 2023-07-29 DIAGNOSIS — Z981 Arthrodesis status: Principal | ICD-10-CM

## 2023-07-29 DIAGNOSIS — Z01818 Encounter for other preprocedural examination: Secondary | ICD-10-CM

## 2023-07-29 DIAGNOSIS — Z96652 Presence of left artificial knee joint: Secondary | ICD-10-CM | POA: Diagnosis present

## 2023-07-29 HISTORY — PX: POSTERIOR LUMBAR FUSION 4 LEVEL: SHX6037

## 2023-07-29 HISTORY — PX: ANTERIOR LUMBAR FUSION: SHX1170

## 2023-07-29 HISTORY — PX: APPLICATION OF INTRAOPERATIVE CT SCAN: SHX6668

## 2023-07-29 HISTORY — PX: TRANSFORAMINAL LUMBAR INTERBODY FUSION (TLIF) WITH PEDICLE SCREW FIXATION 1 LEVEL: SHX6141

## 2023-07-29 LAB — ABO/RH: ABO/RH(D): A POS

## 2023-07-29 SURGERY — ANTERIOR LUMBAR FUSION 3 LEVELS
Anesthesia: General | Site: Spine Lumbar

## 2023-07-29 MED ORDER — HYDROMORPHONE HCL 1 MG/ML IJ SOLN
INTRAMUSCULAR | Status: AC
  Filled 2023-07-29: qty 1

## 2023-07-29 MED ORDER — PROPOFOL 1000 MG/100ML IV EMUL
INTRAVENOUS | Status: AC
  Filled 2023-07-29: qty 100

## 2023-07-29 MED ORDER — ONDANSETRON HCL 4 MG PO TABS: 4.0000 mg | ORAL_TABLET | Freq: Four times a day (QID) | ORAL | Status: DC | PRN

## 2023-07-29 MED ORDER — PHENOL 1.4 % MT LIQD: 1.0000 | OROMUCOSAL | Status: DC | PRN

## 2023-07-29 MED ORDER — SENNA 8.6 MG PO TABS
1.0000 | ORAL_TABLET | Freq: Two times a day (BID) | ORAL | Status: DC
  Administered 2023-07-29 – 2023-08-02 (×8): 8.6 mg via ORAL
  Filled 2023-07-29 (×8): qty 1

## 2023-07-29 MED ORDER — EPINEPHRINE PF 1 MG/ML IJ SOLN
INTRAMUSCULAR | Status: AC
  Filled 2023-07-29: qty 1

## 2023-07-29 MED ORDER — PHENYLEPHRINE HCL-NACL 20-0.9 MG/250ML-% IV SOLN
INTRAVENOUS | Status: DC | PRN
  Administered 2023-07-29: 30 ug/min via INTRAVENOUS
  Administered 2023-07-29: 10 ug/min via INTRAVENOUS
  Administered 2023-07-29: 20 ug/min via INTRAVENOUS
  Administered 2023-07-29 (×2): 160 ug via INTRAVENOUS

## 2023-07-29 MED ORDER — PHENYLEPHRINE 80 MCG/ML (10ML) SYRINGE FOR IV PUSH (FOR BLOOD PRESSURE SUPPORT)
PREFILLED_SYRINGE | INTRAVENOUS | Status: AC
Start: 2023-07-29 — End: ?
  Filled 2023-07-29: qty 10

## 2023-07-29 MED ORDER — ENOXAPARIN SODIUM 40 MG/0.4ML IJ SOSY
40.0000 mg | PREFILLED_SYRINGE | INTRAMUSCULAR | Status: DC
  Administered 2023-07-30 – 2023-08-02 (×4): 40 mg via SUBCUTANEOUS
  Filled 2023-07-29 (×4): qty 0.4

## 2023-07-29 MED ORDER — ACETAMINOPHEN 650 MG RE SUPP: 650.0000 mg | RECTAL | Status: DC | PRN

## 2023-07-29 MED ORDER — METHOCARBAMOL 1000 MG/10ML IJ SOLN
500.0000 mg | Freq: Four times a day (QID) | INTRAMUSCULAR | Status: DC | PRN
  Administered 2023-07-29: 500 mg via INTRAVENOUS

## 2023-07-29 MED ORDER — SODIUM CHLORIDE 0.9% FLUSH
3.0000 mL | Freq: Two times a day (BID) | INTRAVENOUS | Status: DC
  Administered 2023-07-29 – 2023-08-02 (×5): 3 mL via INTRAVENOUS

## 2023-07-29 MED ORDER — FENTANYL CITRATE (PF) 100 MCG/2ML IJ SOLN
INTRAMUSCULAR | Status: AC
  Filled 2023-07-29: qty 2

## 2023-07-29 MED ORDER — METHOCARBAMOL 1000 MG/10ML IJ SOLN
INTRAMUSCULAR | Status: AC
  Filled 2023-07-29: qty 10

## 2023-07-29 MED ORDER — KETOROLAC TROMETHAMINE 15 MG/ML IJ SOLN
7.5000 mg | Freq: Four times a day (QID) | INTRAMUSCULAR | Status: AC
  Administered 2023-07-29 – 2023-07-30 (×4): 7.5 mg via INTRAVENOUS
  Filled 2023-07-29 (×4): qty 1

## 2023-07-29 MED ORDER — SORBITOL 70 % SOLN
30.0000 mL | Freq: Every day | Status: DC | PRN
  Administered 2023-08-01: 30 mL via ORAL
  Filled 2023-07-29 (×2): qty 30

## 2023-07-29 MED ORDER — ACETAMINOPHEN 10 MG/ML IV SOLN
INTRAVENOUS | Status: DC | PRN
  Administered 2023-07-29: 1000 mg via INTRAVENOUS

## 2023-07-29 MED ORDER — OXYCODONE HCL 5 MG PO TABS
5.0000 mg | ORAL_TABLET | ORAL | Status: DC | PRN
  Administered 2023-07-30 – 2023-08-01 (×5): 5 mg via ORAL
  Filled 2023-07-29 (×5): qty 1

## 2023-07-29 MED ORDER — BUPIVACAINE LIPOSOME 1.3 % IJ SUSP
INTRAMUSCULAR | Status: AC
  Filled 2023-07-29: qty 20

## 2023-07-29 MED ORDER — OXYCODONE HCL 5 MG PO TABS
10.0000 mg | ORAL_TABLET | ORAL | Status: DC | PRN
  Administered 2023-07-30 – 2023-08-02 (×8): 10 mg via ORAL
  Filled 2023-07-29 (×8): qty 2

## 2023-07-29 MED ORDER — ACETAMINOPHEN 500 MG PO TABS
1000.0000 mg | ORAL_TABLET | Freq: Four times a day (QID) | ORAL | Status: DC
  Administered 2023-07-29 – 2023-08-02 (×14): 1000 mg via ORAL
  Filled 2023-07-29 (×15): qty 2

## 2023-07-29 MED ORDER — LIDOCAINE HCL (CARDIAC) PF 100 MG/5ML IV SOSY
PREFILLED_SYRINGE | INTRAVENOUS | Status: DC | PRN
  Administered 2023-07-29: 100 mg via INTRAVENOUS

## 2023-07-29 MED ORDER — BUPIVACAINE HCL (PF) 0.5 % IJ SOLN
INTRAMUSCULAR | Status: AC
  Filled 2023-07-29: qty 60

## 2023-07-29 MED ORDER — SUCCINYLCHOLINE CHLORIDE 200 MG/10ML IV SOSY
PREFILLED_SYRINGE | INTRAVENOUS | Status: DC | PRN
  Administered 2023-07-29: 100 mg via INTRAVENOUS

## 2023-07-29 MED ORDER — VANCOMYCIN HCL 1000 MG IV SOLR
INTRAVENOUS | Status: DC | PRN
  Administered 2023-07-29: 1000 mg

## 2023-07-29 MED ORDER — ALBUMIN HUMAN 5 % IV SOLN: INTRAVENOUS | Status: DC | PRN

## 2023-07-29 MED ORDER — SODIUM CHLORIDE 0.9% FLUSH
3.0000 mL | INTRAVENOUS | Status: DC | PRN
  Administered 2023-07-29 (×2): 3 mL via INTRAVENOUS

## 2023-07-29 MED ORDER — KETOROLAC TROMETHAMINE 30 MG/ML IJ SOLN
INTRAMUSCULAR | Status: DC | PRN
  Administered 2023-07-29: 30 mg via INTRAVENOUS

## 2023-07-29 MED ORDER — VITAMIN D 25 MCG (1000 UNIT) PO TABS
1000.0000 [IU] | ORAL_TABLET | Freq: Every day | ORAL | Status: DC
  Administered 2023-07-29 – 2023-08-02 (×5): 1000 [IU] via ORAL
  Filled 2023-07-29 (×5): qty 1

## 2023-07-29 MED ORDER — CHLORHEXIDINE GLUCONATE 0.12 % MT SOLN
OROMUCOSAL | Status: AC
  Filled 2023-07-29: qty 15

## 2023-07-29 MED ORDER — ONDANSETRON HCL 4 MG/2ML IJ SOLN
INTRAMUSCULAR | Status: AC
  Filled 2023-07-29: qty 2

## 2023-07-29 MED ORDER — 0.9 % SODIUM CHLORIDE (POUR BTL) OPTIME
TOPICAL | Status: DC | PRN
  Administered 2023-07-29: 500 mL

## 2023-07-29 MED ORDER — MUPIROCIN 2 % EX OINT
1.0000 | TOPICAL_OINTMENT | Freq: Two times a day (BID) | CUTANEOUS | Status: DC
  Administered 2023-07-29 – 2023-08-02 (×8): 1 via NASAL
  Filled 2023-07-29: qty 22

## 2023-07-29 MED ORDER — SURGIFLO WITH THROMBIN (HEMOSTATIC MATRIX KIT) OPTIME
TOPICAL | Status: DC | PRN
  Administered 2023-07-29: 1 via TOPICAL

## 2023-07-29 MED ORDER — ONDANSETRON HCL 4 MG/2ML IJ SOLN: 4.0000 mg | Freq: Four times a day (QID) | INTRAMUSCULAR | Status: DC | PRN

## 2023-07-29 MED ORDER — ACETAMINOPHEN 10 MG/ML IV SOLN
INTRAVENOUS | Status: AC
  Filled 2023-07-29: qty 100

## 2023-07-29 MED ORDER — OXYCODONE HCL 5 MG/5ML PO SOLN: 5.0000 mg | Freq: Once | ORAL | Status: AC | PRN

## 2023-07-29 MED ORDER — ATORVASTATIN CALCIUM 20 MG PO TABS
80.0000 mg | ORAL_TABLET | Freq: Every day | ORAL | Status: DC
  Administered 2023-07-29 – 2023-08-02 (×5): 80 mg via ORAL
  Filled 2023-07-29 (×5): qty 4

## 2023-07-29 MED ORDER — BUPIVACAINE-EPINEPHRINE (PF) 0.5% -1:200000 IJ SOLN
INTRAMUSCULAR | Status: DC | PRN
  Administered 2023-07-29 (×2): 10 mL

## 2023-07-29 MED ORDER — HYDROMORPHONE HCL 1 MG/ML IJ SOLN
INTRAMUSCULAR | Status: DC | PRN
  Administered 2023-07-29 (×2): .5 mg via INTRAVENOUS

## 2023-07-29 MED ORDER — ALLOPURINOL 100 MG PO TABS
100.0000 mg | ORAL_TABLET | Freq: Every day | ORAL | Status: DC
  Administered 2023-07-30 – 2023-08-02 (×4): 100 mg via ORAL
  Filled 2023-07-29 (×4): qty 1

## 2023-07-29 MED ORDER — SODIUM CHLORIDE (PF) 0.9 % IJ SOLN
INTRAMUSCULAR | Status: DC | PRN
  Administered 2023-07-29: 60 mL via INTRAMUSCULAR

## 2023-07-29 MED ORDER — VANCOMYCIN HCL IN DEXTROSE 1-5 GM/200ML-% IV SOLN
INTRAVENOUS | Status: AC
  Filled 2023-07-29: qty 200

## 2023-07-29 MED ORDER — DOCUSATE SODIUM 100 MG PO CAPS
100.0000 mg | ORAL_CAPSULE | Freq: Two times a day (BID) | ORAL | Status: DC
  Administered 2023-07-29 – 2023-08-02 (×8): 100 mg via ORAL
  Filled 2023-07-29 (×8): qty 1

## 2023-07-29 MED ORDER — VANCOMYCIN HCL 1000 MG IV SOLR
INTRAVENOUS | Status: AC
  Filled 2023-07-29: qty 20

## 2023-07-29 MED ORDER — SODIUM CHLORIDE 0.9 % IV SOLN: 250.0000 mL | INTRAVENOUS | Status: AC

## 2023-07-29 MED ORDER — HYDROMORPHONE HCL 1 MG/ML IJ SOLN
INTRAMUSCULAR | Status: AC
  Filled 2023-07-29: qty 2

## 2023-07-29 MED ORDER — SODIUM CHLORIDE FLUSH 0.9 % IV SOLN
INTRAVENOUS | Status: AC
  Filled 2023-07-29: qty 20

## 2023-07-29 MED ORDER — ACETAMINOPHEN 325 MG PO TABS
650.0000 mg | ORAL_TABLET | ORAL | Status: DC | PRN
  Filled 2023-07-29: qty 2

## 2023-07-29 MED ORDER — IRRISEPT - 450ML BOTTLE WITH 0.05% CHG IN STERILE WATER, USP 99.95% OPTIME
TOPICAL | Status: DC | PRN
  Administered 2023-07-29: 450 mL

## 2023-07-29 MED ORDER — LISINOPRIL 20 MG PO TABS
20.0000 mg | ORAL_TABLET | Freq: Every day | ORAL | Status: DC
  Administered 2023-07-29 – 2023-07-31 (×2): 20 mg via ORAL
  Filled 2023-07-29 (×3): qty 1

## 2023-07-29 MED ORDER — DEXAMETHASONE SODIUM PHOSPHATE 10 MG/ML IJ SOLN
INTRAMUSCULAR | Status: DC | PRN
  Administered 2023-07-29: 10 mg via INTRAVENOUS

## 2023-07-29 MED ORDER — MORPHINE SULFATE (PF) 2 MG/ML IV SOLN
1.0000 mg | INTRAVENOUS | Status: AC | PRN
  Administered 2023-07-29 – 2023-07-30 (×3): 1 mg via INTRAVENOUS
  Filled 2023-07-29 (×3): qty 1

## 2023-07-29 MED ORDER — KETAMINE HCL 50 MG/5ML IJ SOSY
PREFILLED_SYRINGE | INTRAMUSCULAR | Status: DC | PRN
  Administered 2023-07-29: 30 mg via INTRAVENOUS
  Administered 2023-07-29 (×2): 10 mg via INTRAVENOUS

## 2023-07-29 MED ORDER — PHENYLEPHRINE HCL-NACL 20-0.9 MG/250ML-% IV SOLN
INTRAVENOUS | Status: AC
  Filled 2023-07-29: qty 250

## 2023-07-29 MED ORDER — LORATADINE 10 MG PO TABS
10.0000 mg | ORAL_TABLET | Freq: Every day | ORAL | Status: DC
  Administered 2023-07-29 – 2023-08-02 (×5): 10 mg via ORAL
  Filled 2023-07-29 (×5): qty 1

## 2023-07-29 MED ORDER — POLYETHYLENE GLYCOL 3350 17 G PO PACK
17.0000 g | PACK | Freq: Every day | ORAL | Status: DC | PRN
  Administered 2023-08-01: 17 g via ORAL
  Filled 2023-07-29: qty 1

## 2023-07-29 MED ORDER — HYDROMORPHONE HCL 1 MG/ML IJ SOLN
0.2500 mg | INTRAMUSCULAR | Status: DC | PRN
  Administered 2023-07-29: 0.5 mg via INTRAVENOUS
  Administered 2023-07-29 (×4): 0.25 mg via INTRAVENOUS
  Administered 2023-07-29: 0.5 mg via INTRAVENOUS

## 2023-07-29 MED ORDER — SUCCINYLCHOLINE CHLORIDE 200 MG/10ML IV SOSY
PREFILLED_SYRINGE | INTRAVENOUS | Status: AC
  Filled 2023-07-29: qty 10

## 2023-07-29 MED ORDER — DEXAMETHASONE SODIUM PHOSPHATE 10 MG/ML IJ SOLN
INTRAMUSCULAR | Status: AC
  Filled 2023-07-29: qty 1

## 2023-07-29 MED ORDER — CEFAZOLIN SODIUM-DEXTROSE 2-4 GM/100ML-% IV SOLN
INTRAVENOUS | Status: AC
  Filled 2023-07-29: qty 100

## 2023-07-29 MED ORDER — METHOCARBAMOL 500 MG PO TABS: 500.0000 mg | ORAL_TABLET | Freq: Four times a day (QID) | ORAL | Status: DC | PRN

## 2023-07-29 MED ORDER — PROPOFOL 10 MG/ML IV BOLUS
INTRAVENOUS | Status: DC | PRN
  Administered 2023-07-29: 125 ug/kg/min via INTRAVENOUS
  Administered 2023-07-29: 200 mg via INTRAVENOUS

## 2023-07-29 MED ORDER — REMIFENTANIL HCL 1 MG IV SOLR
INTRAVENOUS | Status: AC
  Filled 2023-07-29: qty 1000

## 2023-07-29 MED ORDER — MENTHOL 3 MG MT LOZG: 1.0000 | LOZENGE | OROMUCOSAL | Status: DC | PRN

## 2023-07-29 MED ORDER — SODIUM CHLORIDE 0.9 % IV SOLN: INTRAVENOUS | Status: AC

## 2023-07-29 MED ORDER — MAGNESIUM CITRATE PO SOLN: 1.0000 | Freq: Once | ORAL | Status: DC | PRN

## 2023-07-29 MED ORDER — KETAMINE HCL 50 MG/5ML IJ SOSY
PREFILLED_SYRINGE | INTRAMUSCULAR | Status: AC
  Filled 2023-07-29: qty 5

## 2023-07-29 MED ORDER — MIDAZOLAM HCL 2 MG/2ML IJ SOLN
INTRAMUSCULAR | Status: DC | PRN
  Administered 2023-07-29 (×2): 1 mg via INTRAVENOUS

## 2023-07-29 MED ORDER — FENTANYL CITRATE (PF) 100 MCG/2ML IJ SOLN
INTRAMUSCULAR | Status: DC | PRN
  Administered 2023-07-29: 50 ug via INTRAVENOUS

## 2023-07-29 MED ORDER — REMIFENTANIL HCL 1 MG IV SOLR
INTRAVENOUS | Status: DC | PRN
  Administered 2023-07-29: .1 ug/kg/min via INTRAVENOUS

## 2023-07-29 MED ORDER — KETOROLAC TROMETHAMINE 30 MG/ML IJ SOLN
INTRAMUSCULAR | Status: AC
  Filled 2023-07-29: qty 1

## 2023-07-29 MED ORDER — VERAPAMIL HCL ER 240 MG PO TBCR
240.0000 mg | EXTENDED_RELEASE_TABLET | Freq: Every day | ORAL | Status: DC
  Administered 2023-07-29 – 2023-07-31 (×2): 240 mg via ORAL
  Filled 2023-07-29 (×4): qty 1

## 2023-07-29 MED ORDER — OXYCODONE HCL 5 MG PO TABS
ORAL_TABLET | ORAL | Status: AC
  Filled 2023-07-29: qty 1

## 2023-07-29 MED ORDER — LIDOCAINE HCL (PF) 2 % IJ SOLN
INTRAMUSCULAR | Status: AC
  Filled 2023-07-29: qty 5

## 2023-07-29 MED ORDER — PHENYLEPHRINE 80 MCG/ML (10ML) SYRINGE FOR IV PUSH (FOR BLOOD PRESSURE SUPPORT)
PREFILLED_SYRINGE | INTRAVENOUS | Status: DC | PRN
  Administered 2023-07-29 (×3): 80 ug via INTRAVENOUS

## 2023-07-29 MED ORDER — ALBUMIN HUMAN 5 % IV SOLN
INTRAVENOUS | Status: AC
  Filled 2023-07-29: qty 250

## 2023-07-29 MED ORDER — ONDANSETRON HCL 4 MG/2ML IJ SOLN
INTRAMUSCULAR | Status: DC | PRN
  Administered 2023-07-29: 4 mg via INTRAVENOUS

## 2023-07-29 MED ORDER — OXYCODONE HCL 5 MG PO TABS
5.0000 mg | ORAL_TABLET | Freq: Once | ORAL | Status: AC | PRN
  Administered 2023-07-29: 5 mg via ORAL

## 2023-07-29 MED ORDER — MIDAZOLAM HCL 2 MG/2ML IJ SOLN
INTRAMUSCULAR | Status: AC
  Filled 2023-07-29: qty 2

## 2023-07-29 MED ORDER — HYDROCHLOROTHIAZIDE 12.5 MG PO TABS
12.5000 mg | ORAL_TABLET | Freq: Every day | ORAL | Status: DC
  Administered 2023-07-29 – 2023-07-31 (×2): 12.5 mg via ORAL
  Filled 2023-07-29 (×3): qty 1

## 2023-07-29 SURGICAL SUPPLY — 88 items
ADH SKN CLS APL DERMABOND .7 (GAUZE/BANDAGES/DRESSINGS) ×1
AGENT HMST KT MTR STRL THRMB (HEMOSTASIS) ×1
ALLOGRAFT BONESTRIP KORE 2.5X5 (Bone Implant) IMPLANT
ALLOGRAFT BONESTRP KORE 2.5X10 (Bone Implant) IMPLANT
APL PRP STRL LF DISP 70% ISPRP (MISCELLANEOUS) ×1
BASIN KIT SINGLE STR (MISCELLANEOUS) ×1 IMPLANT
BUR NEURO DRILL SOFT 3.0X3.8M (BURR) ×1 IMPLANT
CAGE SABLE 10X26 9-17 22D (Cage) IMPLANT
CHLORAPREP W/TINT 26 (MISCELLANEOUS) ×1 IMPLANT
CORD LIGHT LATERIAL X LIFT (MISCELLANEOUS) IMPLANT
COVERAGE SUPPORT SPINE BRAINLB (MISCELLANEOUS)
DERMABOND ADVANCED .7 DNX12 (GAUZE/BANDAGES/DRESSINGS) ×1 IMPLANT
DRAPE C ARM PK CFD 31 SPINE (DRAPES) IMPLANT
DRAPE C-ARMOR (DRAPES) IMPLANT
DRAPE HD 5FT BACK TABLE (DRAPES) IMPLANT
DRAPE LAPAROTOMY 100X77 ABD (DRAPES) ×1 IMPLANT
DRAPE MICROSCOPE SPINE 48X150 (DRAPES) IMPLANT
DRAPE SCAN PATIENT (DRAPES) ×1 IMPLANT
DRAPE SHEET LG 3/4 BI-LAMINATE (DRAPES) IMPLANT
DRSG OPSITE POSTOP 4X6 (GAUZE/BANDAGES/DRESSINGS) IMPLANT
DRSG OPSITE POSTOP 4X8 (GAUZE/BANDAGES/DRESSINGS) IMPLANT
DRSG TEGADERM 4X4.75 (GAUZE/BANDAGES/DRESSINGS) IMPLANT
ELECT EZSTD 165MM 6.5IN (MISCELLANEOUS)
ELECT REM PT RETURN 9FT ADLT (ELECTROSURGICAL) ×2
ELECTRODE EZSTD 165MM 6.5IN (MISCELLANEOUS) IMPLANT
ELECTRODE REM PT RTRN 9FT ADLT (ELECTROSURGICAL) ×1 IMPLANT
EVACUATOR 1/8 PVC DRAIN (DRAIN) IMPLANT
EX-PIN ORTHOLOCK NAV 4X150 (PIN) IMPLANT
FEE CVG SUPP BRAINLAB NG SPNE (MISCELLANEOUS) IMPLANT
FEE INTRAOP CADWELL SUPPLY NCS (MISCELLANEOUS) IMPLANT
FEE INTRAOP MONITOR IMPULS NCS (MISCELLANEOUS) IMPLANT
FORCEPS BPLR BAYO 10IN 1.0TIP (ORTHOPEDIC DISPOSABLE SUPPLIES) IMPLANT
GAUZE 4X4 16PLY ~~LOC~~+RFID DBL (SPONGE) ×1 IMPLANT
GAUZE SPONGE 2X2 STRL 8-PLY (GAUZE/BANDAGES/DRESSINGS) ×1 IMPLANT
GLOVE BIOGEL PI IND STRL 6.5 (GLOVE) ×2 IMPLANT
GLOVE SURG SYN 6.5 ES PF (GLOVE) ×2
GLOVE SURG SYN 6.5 PF PI (GLOVE) ×2 IMPLANT
GLOVE SURG SYN 8.5 E (GLOVE) ×4
GLOVE SURG SYN 8.5 PF PI (GLOVE) ×4 IMPLANT
GOWN SRG LRG LVL 4 IMPRV REINF (GOWNS) ×3 IMPLANT
GOWN SRG XL LVL 3 NONREINFORCE (GOWNS) ×1 IMPLANT
GOWN STRL NON-REIN TWL XL LVL3 (GOWNS) ×1
GOWN STRL REIN LRG LVL4 (GOWNS) ×3
HOLDER FOLEY CATH W/STRAP (MISCELLANEOUS) IMPLANT
INTRAOP CADWELL SUPPLY FEE NCS (MISCELLANEOUS)
INTRAOP MONITOR FEE IMPULS NCS (MISCELLANEOUS)
JET LAVAGE IRRISEPT WOUND (IRRIGATION / IRRIGATOR) ×1
KIT DILATOR XLIF 5 (KITS) IMPLANT
KIT DISP MARS 3V (KITS) IMPLANT
KIT PREVENA INCISION MGT 13 (CANNISTER) IMPLANT
KIT SPINAL PRONEVIEW (KITS) ×1 IMPLANT
KNIFE BAYONET SHORT DISCETOMY (MISCELLANEOUS) IMPLANT
LAVAGE JET IRRISEPT WOUND (IRRIGATION / IRRIGATOR) ×1 IMPLANT
MANIFOLD NEPTUNE II (INSTRUMENTS) ×1 IMPLANT
MARKER SKIN DUAL TIP RULER LAB (MISCELLANEOUS) ×1 IMPLANT
MARKER SPHERE PSV REFLC 13MM (MARKER) ×7 IMPLANT
MILL MEDIUM DISP (BLADE) IMPLANT
MODULE NVM5 NEXT GEN EMG (NEUROSURGERY SUPPLIES) IMPLANT
NDL SAFETY ECLIPSE 18X1.5 (NEEDLE) ×1 IMPLANT
NS IRRIG 1000ML POUR BTL (IV SOLUTION) ×1 IMPLANT
PACK LAMINECTOMY ARMC (PACKS) ×1 IMPLANT
PREVENA INCISION MGT 90 150 (MISCELLANEOUS) IMPLANT
ROD RELINE-O 5.5X120MM LORD (Rod) IMPLANT
ROD RELINE-O 5.5X300 STRT NS (Rod) IMPLANT
ROD RELINE-O 5.5X300MM STRT (Rod) ×1 IMPLANT
SCREW LOCK RELINE 5.5 TULIP (Screw) IMPLANT
SCREW RELINE-O POLY 6.5X45 (Screw) IMPLANT
SCREW RELINE-O POLY 7.5X45 (Screw) IMPLANT
SCREW SPINAL 8.5X80 RELINE (Screw) IMPLANT
SPACER HEDRON 10D 18X45X11 (Spacer) IMPLANT
SPACER HEDRON 10D 18X50X11 (Spacer) IMPLANT
STAPLER SKIN PROX 35W (STAPLE) IMPLANT
SURGIFLO W/THROMBIN 8M KIT (HEMOSTASIS) ×1 IMPLANT
SUT ETHILON 3-0 FS-10 30 BLK (SUTURE) ×2
SUT STRATA 3-0 15 PS-2 (SUTURE) ×1 IMPLANT
SUT VIC AB 0 CT1 18XCR BRD 8 (SUTURE) IMPLANT
SUT VIC AB 0 CT1 27 (SUTURE) ×4
SUT VIC AB 0 CT1 27XCR 8 STRN (SUTURE) ×1 IMPLANT
SUT VIC AB 0 CT1 8-18 (SUTURE) ×1
SUT VIC AB 2-0 CT1 18 (SUTURE) ×1 IMPLANT
SUT VLOC 90 3-0 CLR P-12 (SUTURE) IMPLANT
SUTURE EHLN 3-0 FS-10 30 BLK (SUTURE) IMPLANT
SYR 10ML LL (SYRINGE) ×1 IMPLANT
SYR 30ML LL (SYRINGE) ×2 IMPLANT
TOWEL OR 17X26 4PK STRL BLUE (TOWEL DISPOSABLE) ×1 IMPLANT
TRAP FLUID SMOKE EVACUATOR (MISCELLANEOUS) ×1 IMPLANT
TRAY FOLEY SLVR 16FR LF STAT (SET/KITS/TRAYS/PACK) IMPLANT
WATER STERILE IRR 1000ML POUR (IV SOLUTION) ×1 IMPLANT

## 2023-07-29 NOTE — Op Note (Signed)
Indications: [name] is a 62 y.o. 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   Preoperative Note:   Risks of surgery discussed include: infection, bleeding, stroke, coma, death, paralysis, CSF leak, nerve/spinal cord injury, numbness, tingling, weakness, complex regional pain syndrome, recurrent stenosis and/or disc herniation, vascular injury, development of instability, neck/back pain, need for further surgery, persistent symptoms, development of deformity, and the risks of anesthesia. The patient understood these risks and agreed to proceed.  NAME OF ANTERIOR PROCEDURE:               1. Anterior lumbar interbody fusion via a right lateral retroperitoneal approach at L2/3, L3/4, L4/5 2. Placement of a Lordotic Globus Hedron interbody cage, filled with Demineralized Bone Matrix, at each level  NAME OF POSTERIOR PROCEDURE 1. Posterior instrumentation using Nuvasive Reline Instrumentation 2. Posterolateral fusion, L2-S2, utilizing demineralized bone matrix 3. Use of Stereotaxis 4. L L5/S1 Transforaminal interbody fusion   PROCEDURE:  Patient was brought to the operating room, intubated, turned to the lateral  position.  All pressure points were checked and double-checked.  The patient was prepped and draped in the standard fashion. Prior to prepping, fluoroscopy was brought in and the patient was positioned with a large bump under the contralateral side between the iliac crest and rib cage, allowing the area between the iliac crest and the lateral aspect of the rib cage to open and increase the ability to reach inferiorly, to facilitate entry into the disc space.  The incision was marked upon the skin both the location of the disc space as well as the superior most aspect of the iliac crest.  Based on the identification of the disc space an incision was prepared, marked upon the skin and eventually was used for our lateral incision.  The fluoroscopy was turned into a cross table A/P image in order to confirm that the patient's spine remained in a perpendicular trajectory to the floor without rotation.  Once confirming that all the pressure points were checked and double-checked and the patient remained in sturdy position strapped down in this slightly jack-knifed lateral position, the patient was prepped and draped in standard fashion.  The skin was injected with local anesthetic, then incised until the abdominal wall fascia was noted.  I bluntly dissected posteriorly until we were able to identify the posterior musculature near petit's triangle.  At this point, using primarily blunt dissection with our finger aided with a metzenbaum scissor, were able to enter the retroperitoneal cavity.  The retroperitoneal potential space was opened further until palpating out the psoas muscle, the medial aspect of the iliac crest, the medial aspect of the last rib and continued to define the retroperitoneal space with blunt dissection in order to facilitate safe placement of our dilators.    While protecting by dissecting directly onto a finger in the retroperitoneum, the retroperitoneal space was  entered safely from the lateral  incision and the initial dilator placed onto the muscle belly of the psoas.  While directly stimulating the dilator and after radiographically confirming our location relative to the disc space, I placed the dilator through the psoas.  The dilators were stimulated to ensure remaining safely away from any of the lumbar plexus nerves; the dilators were repositioned until no pathologic stimulation was appreciated.  Once I had confirmed the location of our initial dilator radiographically, a K-wire secured the dilator into the L4/5 disc space and confirmed position under A/P and lateral fluoroscopy.  At this point, I dilated up with direct stimulation to confirm lack of pathologic stimulation.  Once all the dilators were in position, I placed in the retractor and secured it onto the table, locked into position and confirmed under A/P and lateral fluoroscopy to confirm our approach angle to the disc space as well as location relative to the disc space.  I then placed the muscle stimulator in through the working channel down to the vertebral body, stimulating the entire lateral surface of the vertebral body and any of the visualized psoas muscle that was adjacent to the retractor, confirming again the safe passage to the psoas before we began performing the discectomy.  At this point, we began our discectomy at L4/5.  The disc was incised laterally throughout the extent of our exposure. Using a combination of pituitary rongeurs, Kerrison rongeurs, rasps, curettes of various sorts, we were able to begin to clean out the disc space.  Once we had cleaned out the majority of the disc space, we then cut the lateral annulus with a cob, breaking the lateral annual attachments on the contralateral side by subtly working the cob through the annulus while using flouroscopy.  Care was taken not to extend further than required after cutting the annular attachments.  After this had been performed, we prepared the endplates for  placement of our graft, sized a graft to the disc space by serially dilating up in trial sizes until we confirmed that our graft would be well positioned, allowing distraction while maintaining good grip.  This was confirmed under A/P and lateral fluoroscopy in order to ensure its placement as an eventual trial for placement of our final graft.  We irrigated with saline.  Once confirmed placement, the Hedron implant filled with allograft was impacted into position at L4/5.   Through a combination of intradiscal distraction and anterior releasing, we were able to correct the anterior deformity during disc preparation and placement of the graft.  After performing the lateral lumbar interbody procedure at L4/5, attention was moved to the L3/4 level.   While protecting by dissecting directly onto a finger in the retroperitoneum, the retroperitoneal space was entered safely from the lateral incision and the initial dilator placed onto the muscle belly of the psoas.  While directly stimulating the dilator and after radiographically confirming our location relative to the disc space, I placed the dilator through the psoas.  The dilators were stimulated to ensure remaining safely away from any of the lumbar plexus nerves; the dilators were repositioned until no pathologic stimulation was appreciated.  Once I had confirmed the location of our initial dilator radiographically, a K-wire secured the dilator into the L3/4 disc space and confirmed position under A/P and lateral fluoroscopy.  At this point, I dilated up with direct stimulation to confirm lack of pathologic stimulation.  Once all the dilators were in position, I placed in the retractor and  secured it to the table, locked into position and confirmed under lateral fluoroscopy.  I then placed the muscle stimulator in through the working channel down to the vertebral body, stimulating the entire lateral surface of the vertebral body and any of the visualized  psoas muscle that was adjacent to the retractor, confirming again the safe passage to the psoas before we began performing the discectomy.  At this point, we began our discectomy at L3/4.  The disc was incised laterally throughout the extent of our exposure. Using a combination of pituitary rongeurs, Kerrison rongeurs, rasps, curettes of various sorts, we were able to begin to clean out the disc space.  Once we had cleaned out the majority of the disc space, we then cut the lateral annulus with a cob, breaking the lateral annual attachments on the contralateral side by subtly working the cob through the annulus while using flouroscopy.  Care was taken not to extend further than required after cutting the annular attachments.  After this had been performed, we prepared the endplates for placement of our graft, sized a graft to the disc space by serially dilating up in trial sizes until we confirmed that our graft would be well positioned, allowing distraction while maintaining good grip.  This was confirmed under A/P and lateral fluoroscopy in order to ensure its placement as an eventual trial for placement of our final graft.  We irrigated with bacteriostatic saline.  Once confirmed placement, the Hedron graft was impacted into position at L3/4.  After performing the lateral lumbar interbody procedure at L3/4, attention was moved to the L2/3 level. An additional incision was made to access the L2/3 space.  While protecting by dissecting directly onto a finger in the retroperitoneum, the retroperitoneal space was entered safely from the lateral incision and the initial dilator placed onto the muscle belly of the psoas.  While directly stimulating the dilator and after radiographically confirming our location relative to the disc space, I placed the dilator through the psoas.  The dilators were stimulated to ensure remaining safely away from any of the lumbar plexus nerves; the dilators were repositioned until  no pathologic stimulation was appreciated.  Once I had confirmed the location of our initial dilator radiographically, a K-wire secured the dilator into the L2/3 disc space and confirmed position under A/P and lateral fluoroscopy.  At this point, I dilated up with direct stimulation to confirm lack of pathologic stimulation.  Once all the dilators were in position, I placed in the retractor and secured it to the table, locked into position and confirmed under lateral fluoroscopy.  I then placed the muscle stimulator in through the working channel down to the vertebral body, stimulating the entire lateral surface of the vertebral body and any of the visualized psoas muscle that was adjacent to the retractor, confirming again the safe passage to the psoas before we began performing the discectomy.  At this point, we began our discectomy at L2/3.  The disc was incised laterally throughout the extent of our exposure. Using a combination of pituitary rongeurs, Kerrison rongeurs, rasps, curettes of various sorts, we were able to begin to clean out the disc space.  Once we had cleaned out the majority of the disc space, we then cut the lateral annulus with a cob, breaking the lateral annual attachments on the contralateral side by subtly working the cob through the annulus while using flouroscopy.  Care was taken not to extend further than required after cutting the annular attachments.  After this had been performed, we prepared the endplates for placement of our graft, sized a graft to the disc space by serially dilating up in trial sizes until we confirmed that our graft would be well positioned, allowing distraction while maintaining good grip.  This was confirmed under A/P and lateral fluoroscopy in order to ensure its placement as an eventual trial for placement of our final graft.  We irrigated with bacteriostatic saline.  Once confirmed placement, the Hedron graft was impacted into position at L2/3.   At  this point, final radiographs were performed, and we began closure.  The wound was closed using 0 Vicryl interrupted suture in the fascia and 2-0 Vicryl inverted suture were placed in the subcutaneous tissue and dermis. 3-0 monocryl was used for final closure. Dermabond was used to close the skin.    After closing the anterior part in layers, the patient was repositioned into prone position.  All pressure points were checked and double-checked.  The posterior operative site was prepped and draped in standard fashion.   The incision was opened with a scalpel, then the soft tissues divided with the Bovie. Self-retaining retractors were placed. The paraspinus muscles were reflected laterally in subperiosteal fashion until the transverse processes were visible. Flouroscopy was used to confirm our localization.  The stereotactic array was placed.  Stereotactic images were acquired and registered to the patient.  We then used stereotactically guided drill guides to cannulate the pedicles bilaterally from L2 to S1.   The pedicle screws were placed.  6.5x45 mm Nuvasive Reline screws were used. The flouroscope was used to confirm placement and acquire additional images for the S2 alar iliac screw placement.  Using the stereotactic drill guide, the left ilium was cannulated.  The ball-tipped probe was used to confirm lack of breach.  The tract was tapped and then an 8.5 x 80 mm screw was placed.  On the right, we attempted to find a path between the SI joint fusion devices.  However, there was no obvious path.  Thus, no iliac screw was placed on the right side.  However, a successful iliac screw was placed on the left, and thus the construct ended with termination in the ilium.   After placement of pedicle screws, a screw-to-screw distractor was placed to distract the L5/S1 disc space. We then turned attention to performing the transforaminal decompression and interbody fusion. The left L5/S1 facet was removed  with osteotomes and the drill, and handed off for preparation as autograft. The traversing and exiting nerve roots on the left were identified and protected. The disc was opened using a scalpel. After incising the disc space, we took a combination of pituitary rongeurs, Kerrison rongeurs, disc scrapers, and curettes to remove a majority of the disc material.  We prepared the end plates for accepting the interbody fusion.  We removed the cartilaginous plate, preserved the cortical endplate if possible during this procedure.  We serially dilated up in order to increase the size of the disc space, while protecting the traversing and exiting nerve roots, until we had sized up to a trial.    The trial was removed, and the disc space packed with autograft and allograft. The Globus Sable TLIF biomechanical device was inserted, then backfilled with allograft, with care taken to protect the nerve roots and thecal sac. After placement of the device, the screw-screw distractor was removed.   Rods were measured to length, cut, and shaped. The rods were secured using locking caps to manufacturer's  specifications. Final AP  radiographs were taken to confirm placement of instrumentation and appropriate alignment. The wound was copiously irrigated, then the external surfaces of the remaining lamina, facet, and transverse processes from L2 to S2 were decorticated. A mixture of allograft and autograft was placed over the decorticated surfaces for arthrodesis.  2 drains were placed subfascially.   After hemostasis, the wound was closed in layers with 0 and 2-0 vicryl. 3-0 monocryl and a wound vac  were applied to the incision.  The patient was then flipped supine and extubated with incident. All counts were correct times 2 at the end of the case. No immediate complications were noted.  [name] PA assisted in the entire procedure. An assistant was required for this procedure due to the complexity.  The assistant  provided assistance in tissue manipulation and suction, and was required for the successful and safe performance of the procedure. I performed the critical portions of the procedure.   Todd Night MD

## 2023-07-29 NOTE — Interval H&P Note (Signed)
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  The various methods of treatment have been discussed with the patient and family. After consideration of risks, benefits and other options for treatment, the patient has consented to  Procedure(s): L2-5 LATERAL LUMBAR INTERBODY FUSION (N/A) OPEN L5-S1 TRANSFORAMINAL LUMBAR INTERBODY FUSION (TLIF) (N/A) L2-S2 POSTERIOR SPINAL FUSION (N/A) APPLICATION OF INTRAOPERATIVE CT SCAN (N/A) as a surgical intervention.  The patient's history has been reviewed, patient examined, no change in status, stable for surgery.  I have reviewed the patient's chart and labs.  Questions were answered to the patient's satisfaction.    Heart sounds normal no MRG. Chest Clear to Auscultation Bilaterally.  Todd Madden

## 2023-07-29 NOTE — Anesthesia Preprocedure Evaluation (Signed)
Anesthesia Evaluation  Patient identified by MRN, date of birth, ID band Patient awake    Reviewed: Allergy & Precautions, NPO status , Patient's Chart, lab work & pertinent test results  History of Anesthesia Complications Negative for: history of anesthetic complications  Airway Mallampati: III  TM Distance: >3 FB Neck ROM: full    Dental no notable dental hx.    Pulmonary sleep apnea and Continuous Positive Airway Pressure Ventilation    Pulmonary exam normal        Cardiovascular hypertension, On Medications negative cardio ROS Normal cardiovascular exam     Neuro/Psych  Neuromuscular disease  negative psych ROS   GI/Hepatic negative GI ROS, Neg liver ROS,,,  Endo/Other  negative endocrine ROS    Renal/GU      Musculoskeletal   Abdominal   Peds  Hematology negative hematology ROS (+)   Anesthesia Other Findings Past Medical History: No date: Actinic keratosis No date: Alcohol use 04/18/2020: Basal cell carcinoma     Comment:  Right post. shoulder. Superficial.  08/16/2020: Basal cell carcinoma     Comment:  R mid back  10/12/2020: Basal cell carcinoma     Comment:  right distal lat tricep above elbow, right distal lat               tricep above elbow medial - EDC x 2 12/05/2020: Basal cell carcinoma     Comment:  right ant lat deltoid, edc 04/18/2022: Basal cell carcinoma     Comment:  R post deltoid, EDC 10/30/2022: BCC (basal cell carcinoma of skin)     Comment:  R lat deltoid - ED&C No date: Chronic back pain No date: ED (erectile dysfunction) No date: Gout No date: Hyperlipidemia No date: Hypertension No date: Lumbar stenosis with neurogenic claudication 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 Adolphus Birchwood  No date: Spondylolisthesis  Past Surgical History: 09/06/2017:  TENDON SHEATH INCISION (EG, FOR TRIGGER FINGER)-THUMB;  Right No date: CARPAL TUNNEL RELEASE; Bilateral No date: COLONOSCOPY W/ POLYPECTOMY 08/11/2019: INJECTION(S); SINGLE TENDON ORIGIN/INSERTION mIddle  finger; Right No date: JOINT REPLACEMENT     Comment:  TKA Left No date: KNEE ARTHROSCOPY No date: KNEE ARTHROSCOPY W/ MENISCECTOMY; Left 06/05/2016: NEUROPLASTY AND/OR TRANSPOSITION; MEDIAN NERVE AT CARPAL  TUNNEL; Left 06/26/2016: NEUROPLASTY AND/OR TRANSPOSITION; MEDIAN NERVE AT CARPAL  TUNNEL; Right No date: OLECRANON BURSECTOMY No date: REPLACEMENT TOTAL KNEE; Left No date: ROTATOR CUFF REPAIR; Left No date: ROTATOR CUFF REPAIR; Right 08/11/2019: TENDON SHEATH INCISION (EG, FOR TRIGGER FINGER) Middle;  Left     Reproductive/Obstetrics negative OB ROS                             Anesthesia Physical Anesthesia Plan  ASA: 2  Anesthesia Plan: General ETT   Post-op Pain Management: Ofirmev IV (intra-op)*, Toradol IV (intra-op)* and Dilaudid IV   Induction: Intravenous  PONV Risk Score and Plan: 2 and Ondansetron, Dexamethasone, Midazolam and Treatment may vary due to age or medical condition  Airway Management Planned: Oral ETT  Additional Equipment:   Intra-op Plan:   Post-operative  Plan: Extubation in OR  Informed Consent: I have reviewed the patients History and Physical, chart, labs and discussed the procedure including the risks, benefits and alternatives for the proposed anesthesia with the patient or authorized representative who has indicated his/her understanding and acceptance.     Dental Advisory Given  Plan Discussed with: Anesthesiologist, CRNA and Surgeon  Anesthesia Plan Comments: (Patient consented for risks of anesthesia including but not limited to:  -  adverse reactions to medications - damage to eyes, teeth, lips or other oral mucosa - nerve damage due to positioning  - sore throat or hoarseness - Damage to heart, brain, nerves, lungs, other parts of body or loss of life  Patient voiced understanding and assent.)       Anesthesia Quick Evaluation

## 2023-07-29 NOTE — Transfer of Care (Signed)
Immediate Anesthesia Transfer of Care Note  Patient: Todd Madden  Procedure(s) Performed: L2-5 LATERAL LUMBAR INTERBODY FUSION (Spine Lumbar) OPEN L5-S1 TRANSFORAMINAL LUMBAR INTERBODY FUSION (TLIF) (Spine Lumbar) L2-S2 POSTERIOR SPINAL FUSION (Spine Lumbar) APPLICATION OF INTRAOPERATIVE CT SCAN (Spine Lumbar)  Patient Location: PACU  Anesthesia Type:General  Level of Consciousness: sedated  Airway & Oxygen Therapy: Patient Spontanous Breathing and Patient connected to face mask oxygen  Post-op Assessment: Report given to RN and Post -op Vital signs reviewed and stable  Post vital signs: Reviewed and stable  Last Vitals:  Vitals Value Taken Time  BP 107/73 07/29/23 1801  Temp 37.1 C 07/29/23 1801  Pulse 110 07/29/23 1807  Resp 18 07/29/23 1807  SpO2 100 % 07/29/23 1807  Vitals shown include unfiled device data.  Last Pain:  Vitals:   07/29/23 0952  PainSc: 8          Complications: No notable events documented.

## 2023-07-29 NOTE — Anesthesia Procedure Notes (Signed)
Procedure Name: Intubation Date/Time: 07/29/2023 11:31 AM  Performed by: Reece Agar, CRNAPre-anesthesia Checklist: Patient identified, Emergency Drugs available, Suction available and Patient being monitored Patient Re-evaluated:Patient Re-evaluated prior to induction Oxygen Delivery Method: Circle system utilized Preoxygenation: Pre-oxygenation with 100% oxygen Induction Type: IV induction Ventilation: Mask ventilation without difficulty Laryngoscope Size: 4 and McGraph Grade View: Grade II Tube type: Oral Tube size: 7.5 mm Number of attempts: 1 Airway Equipment and Method: Stylet Placement Confirmation: ETT inserted through vocal cords under direct vision, positive ETCO2 and breath sounds checked- equal and bilateral Secured at: 21 cm Tube secured with: Tape Dental Injury: Teeth and Oropharynx as per pre-operative assessment

## 2023-07-29 NOTE — Plan of Care (Signed)

## 2023-07-30 ENCOUNTER — Encounter: Payer: Self-pay | Admitting: Neurosurgery

## 2023-07-30 LAB — CBC
HCT: 27.5 % — ABNORMAL LOW (ref 39.0–52.0)
Hemoglobin: 9.4 g/dL — ABNORMAL LOW (ref 13.0–17.0)
MCH: 30.9 pg (ref 26.0–34.0)
MCHC: 34.2 g/dL (ref 30.0–36.0)
MCV: 90.5 fL (ref 80.0–100.0)
Platelets: 232 10*3/uL (ref 150–400)
RBC: 3.04 MIL/uL — ABNORMAL LOW (ref 4.22–5.81)
RDW: 12.9 % (ref 11.5–15.5)
WBC: 9.5 10*3/uL (ref 4.0–10.5)
nRBC: 0 % (ref 0.0–0.2)

## 2023-07-30 LAB — CREATININE, SERUM
Creatinine, Ser: 0.81 mg/dL (ref 0.61–1.24)
GFR, Estimated: >60 mL/min (ref >60)

## 2023-07-30 MED ORDER — SIMETHICONE 80 MG PO CHEW
80.0000 mg | CHEWABLE_TABLET | Freq: Four times a day (QID) | ORAL | Status: DC | PRN
  Administered 2023-07-31: 80 mg via ORAL
  Filled 2023-07-30 (×2): qty 1

## 2023-07-30 NOTE — Plan of Care (Signed)

## 2023-07-30 NOTE — Anesthesia Postprocedure Evaluation (Signed)
Anesthesia Post Note  Patient: Todd Madden  Procedure(s) Performed: L2-5 LATERAL LUMBAR INTERBODY FUSION (Spine Lumbar) OPEN L5-S1 TRANSFORAMINAL LUMBAR INTERBODY FUSION (TLIF) (Spine Lumbar) L2-S2 POSTERIOR SPINAL FUSION (Spine Lumbar) APPLICATION OF INTRAOPERATIVE CT SCAN (Spine Lumbar)  Patient location during evaluation: PACU Anesthesia Type: General Level of consciousness: awake and alert Pain management: pain level controlled Vital Signs Assessment: post-procedure vital signs reviewed and stable Respiratory status: spontaneous breathing, nonlabored ventilation, respiratory function stable and patient connected to nasal cannula oxygen Cardiovascular status: blood pressure returned to baseline and stable Postop Assessment: no apparent nausea or vomiting Anesthetic complications: no   No notable events documented.   Last Vitals:  Vitals:   07/29/23 2051 07/30/23 0010  BP: 104/71 110/75  Pulse: (!) 107 90  Resp: 20 20  Temp: 36.8 C 36.7 C  SpO2: 91% 95%    Last Pain:  Vitals:   07/30/23 0010  TempSrc: Oral  PainSc:                  Cleda Mccreedy Jeremaih Klima

## 2023-07-30 NOTE — Evaluation (Signed)
Physical Therapy Evaluation Patient Details Name: Todd Madden MRN: [id number] DOB: Jul 21, 1961 Today's Date: 07/30/2023  History of Present Illness  Pt is a 62 yo M s/p L2-5 lateral fusions, L2-S2 posterolateral fusions, and L5/S1 interbody fusion due to scoliosis, spondylolisthesis, chronic LBP, sciatica, and lumbar stenosis. PMH includes HTN, arthritis, and prior skin cancer.  Clinical Impression  Pt was pleasant and motivated to participate during the session and put forth good effort throughout. Pt presented A,O x4 and spouse was present throughout session. Pt req min physical assist for bed mobility but was able to perform effectively after receiving log roll training. Pt req CGA for transfers to perform safely and cuing to improve efficiency. Pt did not req physical assist to take lateral steps at EOB, and physically tolerated well. Pt did report dizziness upon both initial sitting and standing, BP measurements recorded and MD, OT, nursing were notified of orthostatic hypotension. Otherwise, pt tolerated session well and reported no increase in pain. Pt HR and SpO2 were monitored throughout and remained WNL on RA. Pt will benefit from continued PT services upon discharge to safely address deficits listed in patient problem list for decreased caregiver assistance and eventual return to PLOF.      If plan is discharge home, recommend the following: A little help with walking and/or transfers;A little help with bathing/dressing/bathroom;Assistance with cooking/housework;Help with stairs or ramp for entrance;Assist for transportation   Can travel by private vehicle        Equipment Recommendations None recommended by PT  Recommendations for Other Services       Functional Status Assessment Patient has had a recent decline in their functional status and demonstrates the ability to make significant improvements in function in a reasonable and predictable amount of time.     Precautions /  Restrictions Precautions Precautions: Fall Required Braces or Orthoses: Spinal Brace Spinal Brace: Applied in sitting ; able to walk to bathroom in room without brace, off in bed; Lumbar corset Restrictions Weight Bearing Restrictions: No      Mobility  Bed Mobility Overal bed mobility: Needs Assistance Bed Mobility: Rolling, Sidelying to Sit Rolling: Used rails, Min assist Sidelying to sit: HOB elevated, Used rails, Min assist       General bed mobility comments: physical assist for BLE mobility, reported dizziness upon coming to sit, able to perform log rolling technique with instruction, good BUE strength    Transfers Overall transfer level: Needs assistance Equipment used: Rolling walker (2 wheels) Transfers: Sit to/from Stand Sit to Stand: From elevated surface, Contact guard assist           General transfer comment: reported dizziness upon standing, able to complete with forward lean/hip hinge instruction, no real unsteadiness, cuing for hand placement/pushoff    Ambulation/Gait Ambulation/Gait assistance: Contact guard assist Gait Distance (Feet): 2 Feet Assistive device: Rolling walker (2 wheels)   Gait velocity: decreased     General Gait Details: able to talk small lateral steps toward HOB, no unsteadiness observed, good RW management; distance restricted 2/2 orthostatic hypotension  Stairs            Wheelchair Mobility     Tilt Bed    Modified Rankin (Stroke Patients Only)       Balance Overall balance assessment: Needs assistance Sitting-balance support: Feet supported, No upper extremity supported Sitting balance-Leahy Scale: Good Sitting balance - Comments: able to don/doff lumbar brace seated EOB with min assist   Standing balance support: Bilateral upper extremity supported, During functional  activity Standing balance-Leahy Scale: Good Standing balance comment: steady in static stand, no unsteadiness observed with minimal dynamic  activities                             Pertinent Vitals/Pain Pain Assessment Pain Assessment: 0-10 Pain Score: 7  Pain Location: low back Pain Descriptors / Indicators: Aching, Sore Pain Intervention(s): Premedicated before session, Repositioned, Monitored during session    Home Living Family/patient expects to be discharged to:: Private residence Living Arrangements: Spouse/significant other Available Help at Discharge: Family;Available 24 hours/day Type of Home: House Home Access: Stairs to enter Entrance Stairs-Rails:  (grab bar on R side) Entrance Stairs-Number of Steps: 2 Alternate Level Stairs-Number of Steps: flight, R side rail Home Layout: Two level Home Equipment: BSC/3in1;Rolling Walker (2 wheels);Cane - single point (grab bar in upstairs shower)      Prior Function Prior Level of Function : Independent/Modified Independent;Driving               Mobility/ADLs Comments: Pt reported ind mobility with limited community distances without AD, and no falls; only occasionally limited with ADLs such as yard work due to pain and LE weakness, otherwise ind with ADLs     Extremity/Trunk Assessment   Upper Extremity Assessment Upper Extremity Assessment: Overall WFL for tasks assessed    Lower Extremity Assessment Lower Extremity Assessment: Generalized weakness       Communication   Communication Communication: No apparent difficulties Cueing Techniques: Verbal cues;Visual cues  Cognition Arousal: Alert Behavior During Therapy: WFL for tasks assessed/performed Overall Cognitive Status: Within Functional Limits for tasks assessed                                          General Comments      Exercises Total Joint Exercises Long Arc Quad: AROM, Both, 10 reps, Seated Other Exercises Other Exercises: educated pt and spouse on log roll technique for safe bed mobility, and to avoid BLT for safety and to decrease pain Other  Exercises: Instructed pt to perform ankle pumps, quad sets, and glute sets approximately once per hour for 10-15 reps each   Assessment/Plan    PT Assessment Patient needs continued PT services  PT Problem List Decreased strength;Decreased range of motion;Decreased activity tolerance;Decreased balance;Decreased knowledge of use of DME;Decreased mobility;Decreased knowledge of precautions;Cardiopulmonary status limiting activity;Pain;Decreased skin integrity       PT Treatment Interventions DME instruction;Gait training;Stair training;Functional mobility training;Therapeutic activities;Patient/family education;Balance training;Therapeutic exercise;Neuromuscular re-education    PT Goals (Current goals can be found in the Care Plan section)  Acute Rehab PT Goals Patient Stated Goal: get back to walking and yard work PT Goal Formulation: With patient Time For Goal Achievement: 08/12/23 Potential to Achieve Goals: Good    Frequency BID     Co-evaluation               AM-PAC PT "6 Clicks" Mobility  Outcome Measure Help needed turning from your back to your side while in a flat bed without using bedrails?: A Little Help needed moving from lying on your back to sitting on the side of a flat bed without using bedrails?: A Lot Help needed moving to and from a bed to a chair (including a wheelchair)?: A Little Help needed standing up from a chair using your arms (e.g., wheelchair or bedside chair)?: A Little  Help needed to walk in hospital room?: A Little Help needed climbing 3-5 steps with a railing? : A Lot 6 Click Score: 16    End of Session Equipment Utilized During Treatment: Gait belt;Back brace Activity Tolerance: Other (comment) (pt mobility limited by dizziness/orthostatic hypotension) Patient left: in bed;with call bell/phone within reach;with bed alarm set;with family/visitor present Nurse Communication: Mobility status;Other (comment) (orthostatic hypotension) PT Visit  Diagnosis: Other abnormalities of gait and mobility (R26.89);Muscle weakness (generalized) (M62.81);Pain Pain - Right/Left:  (bilat) Pain - part of body:  (low back)    Time: 7829-5621 PT Time Calculation (min) (ACUTE ONLY): 36 min   Charges:               Rosiland Oz SPT 07/30/23, 2:05 PM

## 2023-07-30 NOTE — Discharge Instructions (Signed)
  Your surgeon has performed an operation on your lumbar spine (low back) to relieve pressure on one or more nerves. Many times, patients feel better immediately after surgery and can "overdo it." Even if you feel well, it is important that you follow these activity guidelines. If you do not let your back heal properly from the surgery, you can increase the chance of hardware complications and/or return of your symptoms. The following are instructions to help in your recovery once you have been discharged from the hospital.  Do not use NSAIDs for 3 months after surgery.   Activity    No bending, lifting, or twisting ("BLT"). Avoid lifting objects heavier than 10 pounds (gallon milk jug).  Where possible, avoid household activities that involve lifting, bending, pushing, or pulling such as laundry, vacuuming, grocery shopping, and childcare. Try to arrange for help from friends and family for these activities while your back heals.  Increase physical activity slowly as tolerated.  Taking short walks is encouraged, but avoid strenuous exercise. Do not jog, run, bicycle, lift weights, or participate in any other exercises unless specifically allowed by your doctor. Avoid prolonged sitting, including car rides.  Talk to your doctor before resuming sexual activity.  You should not drive until cleared by your doctor.  Until released by your doctor, you should not return to work or school.  You should rest at home and let your body heal.   You may shower three days after your surgery.  After showering, lightly dab your incision dry. Do not take a tub bath or go swimming for 3 weeks, or until approved by your doctor at your follow-up appointment.  If you smoke, we strongly recommend that you quit.  Smoking has been proven to interfere with normal healing in your back and will dramatically reduce the success rate of your surgery. Please contact [facility] (800-QUIT-NOW) and use the resources at  www.[facility].com for assistance in stopping smoking.  Surgical Incision   If you have a dressing on your incision, you may remove it three days after your surgery. Keep your incision area clean and dry.  Your incision was closed with Dermabond glue. The glue should begin to peel away within about a week.  Diet            You may return to your usual diet. Be sure to stay hydrated.  When to Contact us  Although your surgery and recovery will likely be uneventful, you may have some residual numbness, aches, and pains in your back and/or legs. This is normal and should improve in the next few weeks.  However, should you experience any of the following, contact us immediately: New numbness or weakness Pain that is progressively getting worse, and is not relieved by your pain medications or rest Bleeding, redness, swelling, pain, or drainage from surgical incision Chills or flu-like symptoms Fever greater than 101.0 F (38.3 C) Problems with bowel or bladder functions Difficulty breathing or shortness of breath Warmth, tenderness, or swelling in your calf  Contact Information How to contact us:  If you have any questions/concerns before or after surgery, you can reach [place] at [phone], or you can send a mychart message. We can be reached by phone or mychart 8am-4pm, Monday-Friday.  *Please note: Calls after 4pm are forwarded to a third party answering service. Mychart messages are not routinely monitored during evenings, weekends, and holidays. Please call our office to contact the answering service for urgent concerns during non-business hours.

## 2023-07-30 NOTE — Evaluation (Signed)
Occupational Therapy Evaluation Patient Details Name: Todd Madden MRN: [id number] DOB: [birth year] Today's Date: 07/30/2023   History of Present Illness Pt is a 62 yo M s/p L2-5 lateral fusions, L2-S2 posterolateral fusions, and L5/S1 interbody fusion due to scoliosis, spondylolisthesis, chronic LBP, sciatica, and lumbar stenosis. PMH includes HTN, arthritis, and prior skin cancer.   Clinical Impression   Pt seen for OT evaluation this date, POD#1 from lumbar surgery. Prior to hospital admission, pt was generally independent with ADL and IADL however, had very limited standing tolerance due to back pain. Pt lives with spouse and will have assist upon discharge. Pt received seated on toilet in bathroom with RN at start of session. Pt completed STS from std height toilet with use of grab bar and RW for support, no direct assist provided and ambulated back to the bed with supervision. Pt required limited VC for log roll to return to bed with good carryover noted from previous therapy session. Pt currently requires PRN MIN A for LB ADL tasks in order to maintain back precautions and pt able to demonstrate independence with brace mgt. Pt/family instructed in back precautions (BLT) and how to maintain during ADL mobility, bathing, dressing, toileting, and grooming tasks as well as RW mgt in kitchen versus bathroom. Educated in home/routines modifications and AE/DME to support ADL and IADL including return to work. Pt/family verbalized understanding. Handout provided to support recall and carryover. No additional skilled OT needs at this time. Will discharge in house. Upon hospital discharge, pt safe to discharge home.     If plan is discharge home, recommend the following: A little help with bathing/dressing/bathroom;Assistance with cooking/housework;Assist for transportation;Help with stairs or ramp for entrance    Functional Status Assessment  Patient has had a recent decline in their functional status  and demonstrates the ability to make significant improvements in function in a reasonable and predictable amount of time.  Equipment Recommendations  None recommended by OT    Recommendations for Other Services       Precautions / Restrictions Precautions Precautions: Fall;Back Precaution Booklet Issued: Yes (comment) Required Braces or Orthoses: Spinal Brace Spinal Brace: Applied in sitting position;Lumbar corset Spinal Brace Comments: Wear brace during ambulation.  Don/doff brace in sitting.  May amb to bathroom without brace. Brace may be off in bed and in sitting. May remove brace for showering. Restrictions Weight Bearing Restrictions: No      Mobility Bed Mobility Overal bed mobility: Needs Assistance Bed Mobility: Sit to Sidelying, Rolling Rolling: Used rails, Contact guard assist       Sit to sidelying: Supervision, Used rails General bed mobility comments: VC to improve technique with good carryover    Transfers Overall transfer level: Needs assistance Equipment used: Rolling walker (2 wheels) Transfers: Sit to/from Stand Sit to Stand: Supervision           General transfer comment: STS from std height toilet with use of grab bar + RW      Balance Overall balance assessment: Mild deficits observed, not formally tested                                         ADL either performed or assessed with clinical judgement   ADL Overall ADL's : Needs assistance/impaired  General ADL Comments: Pt requires PRN MIN A for LB ADL tasks and supv for ADL mobility with RW. PRN VC for brace mgt and precautions. Family able to provided needed level of assist.     Vision         Perception         Praxis         Pertinent Vitals/Pain Pain Assessment Pain Assessment: 0-10 Pain Score: 6  Pain Location: low back Pain Descriptors / Indicators: Aching, Sore Pain Intervention(s): Limited  activity within patient's tolerance, Monitored during session, Premedicated before session, Repositioned     Extremity/Trunk Assessment Upper Extremity Assessment Upper Extremity Assessment: Overall WFL for tasks assessed   Lower Extremity Assessment Lower Extremity Assessment: Overall WFL for tasks assessed   Cervical / Trunk Assessment Cervical / Trunk Assessment: Back Surgery   Communication Communication Communication: No apparent difficulties Cueing Techniques: Verbal cues;Visual cues   Cognition Arousal: Alert Behavior During Therapy: WFL for tasks assessed/performed Overall Cognitive Status: Within Functional Limits for tasks assessed                                       General Comments       Exercises Other Exercises Other Exercises: Pt/family instructed in back precautions (BLT) and how to maintain during ADL mobility, bathing, dressing, toileting, and grooming tasks as well as RW mgt in kitchen versus bathroom. Educated in home/routines modifications and AE/DME to support ADL and IADL including return to work.   Shoulder Instructions      Home Living Family/patient expects to be discharged to:: Private residence Living Arrangements: Spouse/significant other Available Help at Discharge: Family;Available 24 hours/day Type of Home: House Home Access: Stairs to enter [employer] of Steps: 2 Entrance Stairs-Rails:  (grab bar on R side) Home Layout: Two level Alternate Level Stairs-Number of Steps: flight Alternate Level Stairs-Rails: Right Bathroom Shower/Tub: Arts development officer Toilet: Handicapped height     Home Equipment: Teacher, English as a foreign language (2 wheels);Cane - single point;Adaptive equipment;Hand held shower head (grab bar in upstairs shower) Adaptive Equipment: Reacher        Prior Functioning/Environment Prior Level of Function : Independent/Modified Independent;Driving               ADLs Comments: pt  reported only occasionally limited with ADLs such as yard work due to pain and LE weakness. works (primarily computer/desk work)        OT Problem List: Pain;Decreased range of motion;Decreased knowledge of precautions      OT Treatment/Interventions:      OT Goals(Current goals can be found in the care plan section) Acute Rehab OT Goals Patient Stated Goal: be independent OT Goal Formulation: All assessment and education complete, DC therapy  OT Frequency:      Co-evaluation              AM-PAC OT "6 Clicks" Daily Activity     Outcome Measure Help from another person eating meals?: None Help from another person taking care of personal grooming?: None Help from another person toileting, which includes using toliet, bedpan, or urinal?: None Help from another person bathing (including washing, rinsing, drying)?: A Little Help from another person to put on and taking off regular upper body clothing?: None Help from another person to put on and taking off regular lower body clothing?: A Little 6 Click Score: 22   End of Session  Equipment Utilized During Treatment: Rolling walker (2 wheels);Back brace Nurse Communication: Mobility status  Activity Tolerance: Patient tolerated treatment well Patient left: in bed;with call bell/phone within reach;with bed alarm set;with family/visitor present;with SCD's reapplied  OT Visit Diagnosis: Other abnormalities of gait and mobility (R26.89)                Time: 1545-1610 OT Time Calculation (min): 25 min Charges:  OT General Charges $OT Visit: 1 Visit OT Evaluation $OT Eval Low Complexity: 1 Low OT Treatments $Self Care/Home Management : 8-22 mins  Arman Filter., MPH, MS, OTR/L ascom [phone] 07/30/23, 4:42 PM

## 2023-07-30 NOTE — Progress Notes (Signed)
   Neurosurgery Progress Note  History: Todd Madden is here for s/p L2-5 XLIF, L5-S1 TLIF. L2-S2 PSF  POD1: Pt doing well this morning with expected back pain.  He feels his pain is largely managed by as needed medications.  Physical Exam: Vitals:   07/30/23 0346 07/30/23 0732  BP: 110/69 109/70  Pulse: 79 74  Resp: 20 17  Temp: 98 F (36.7 C) 98 F (36.7 C)  SpO2: 96% 97%    AA Ox3 CNI  Strength:5/5 throughout BLE HV output right 150 and left 300 since surgery  Data:  Other tests/results:     Latest Ref Rng & Units 07/30/2023    5:42 AM 05/15/2022    4:24 PM 11/10/2021   11:27 AM  CBC  WBC 4.0 - 10.5 K/uL 9.5  6.6  6.1   Hemoglobin 13.0 - 17.0 g/dL 9.4  66.4  40.3   Hematocrit 39.0 - 52.0 % 27.5  41.4  42.3   Platelets 150 - 400 K/uL 232  299.0  321      Assessment/Plan:  Todd Madden is a 62 year old presenting with coronal plane imbalance, spondylolisthesis, and right lumbar radiculopathy status post L2-5 XLIF, L5-S1 TLIF. L2-S2 PSF  - mobilize - pain control; transition to PO medications today - DVT prophylaxis - continue to hold home BP medications for hypotension - continue Hemovacs and wound vac -Continue to monitor H&H. - PTOT; brace when OOB and ambulating. Pt should have brace from home. Ok to work with PT prior to arrival if needed.   [name] PA-C Department of Neurosurgery

## 2023-07-30 NOTE — Progress Notes (Signed)
Physical Therapy Treatment Patient Details Name: Todd Madden MRN: [id number] DOB: 1961-02-19 Today's Date: 07/30/2023   History of Present Illness Pt is a 62 yo M s/p L2-5 lateral fusions, L2-S2 posterolateral fusions, and L5/S1 interbody fusion due to scoliosis, spondylolisthesis, chronic LBP, sciatica, and lumbar stenosis. PMH includes HTN, arthritis, and prior skin cancer.    PT Comments  Pt was pleasant and motivated to participate during the session and put forth good effort throughout. Pt able to complete bed mobility and transfers with CGA for safety and line management. Pt showed good recall of log rolling mobility and brace donning/doffing from AM session. Pt able to perform gait training in hall with RW and CGA for line management, tolerating distance well. Monitored vitals throughout session, pt reported no notable increase in pain or adverse symptoms. Orthostatic blood pressures taken, asymptomatic and all WNL, nursing notified. Pt will benefit from continued PT services upon discharge to safely address deficits listed in patient problem list for decreased caregiver assistance and eventual return to PLOF.    If plan is discharge home, recommend the following: A little help with walking and/or transfers;A little help with bathing/dressing/bathroom;Assistance with cooking/housework;Help with stairs or ramp for entrance;Assist for transportation   Can travel by private vehicle        Equipment Recommendations  None recommended by PT    Recommendations for Other Services       Precautions / Restrictions Precautions Precautions: Fall Required Braces or Orthoses: Spinal Brace Spinal Brace: Applied in sitting position;Lumbar corset Spinal Brace Comments: Wear brace during ambulation.  Don/doff brace in sitting.  May amb to bathroom without brace. Brace may be off in bed and in sitting. May remove brace for showering. Restrictions Weight Bearing Restrictions: No     Mobility   Bed Mobility Overal bed mobility: Needs Assistance Bed Mobility: Rolling, Sidelying to Sit Rolling: Used rails, Contact guard assist Sidelying to sit: HOB elevated, Used rails, Contact guard assist       General bed mobility comments: able to complete in step-wise form with extra time, good recall of log rolling method - cuing given to reinforce    Transfers Overall transfer level: Needs assistance Equipment used: Rolling walker (2 wheels) Transfers: Sit to/from Stand Sit to Stand: From elevated surface, Contact guard assist           General transfer comment: good pushoff from BUEs, steady upon standing, cuing for hip hinge and hand placement    Ambulation/Gait Ambulation/Gait assistance: Contact guard assist Gait Distance (Feet): 100 Feet Assistive device: Rolling walker (2 wheels) Gait Pattern/deviations: Step-through pattern, Decreased stride length, Decreased step length - right, Decreased step length - left Gait velocity: decreased     General Gait Details: min BUE reliance on RW for steadying, good RW management, wide turns with AD, no overt unsteadiness throughout   Stairs             Wheelchair Mobility     Tilt Bed    Modified Rankin (Stroke Patients Only)       Balance Overall balance assessment: Needs assistance Sitting-balance support: Feet supported, No upper extremity supported Sitting balance-Leahy Scale: Good Sitting balance - Comments: able to perform seated EOB exercises and don/doff lumbar brace without LOB   Standing balance support: Bilateral upper extremity supported, During functional activity Standing balance-Leahy Scale: Good Standing balance comment: steady in static stand, no real unsteadiness observed with dynamic standing activities using RW  Cognition Arousal: Alert Behavior During Therapy: WFL for tasks assessed/performed Overall Cognitive Status: Within Functional Limits for tasks  assessed                                          Exercises Total Joint Exercises Hip ABduction/ADduction: AROM, Both, 10 reps, Supine, Other (comment) (decrease ROM if painful) Long Arc Quad: AROM, Both, 10 reps, Seated (resistance at distal LL) Marching in Standing: AROM, Both, 5 reps, Standing General Exercises - Lower Extremity Heel Raises: AROM, Both, 10 reps, Seated (resisted through distal femur) Other Exercises: Instructed pt to perform ankle pumps, quad sets, and hip abd in supine approximately once per hour for 10-15 reps each    General Comments        Pertinent Vitals/Pain Pain Assessment Pain Assessment: 0-10 Pain Score: 7  Pain Location: low back Pain Descriptors / Indicators: Aching, Sore Pain Intervention(s): Monitored during session, Repositioned    Home Living             Prior Function            PT Goals (current goals can now be found in the care plan section) Acute Rehab PT Goals Patient Stated Goal: get back to walking and yard work PT Goal Formulation: With patient Time For Goal Achievement: 08/12/23 Potential to Achieve Goals: Good Progress towards PT goals: Progressing toward goals    Frequency    BID      PT Plan      Co-evaluation              AM-PAC PT "6 Clicks" Mobility   Outcome Measure  Help needed turning from your back to your side while in a flat bed without using bedrails?: A Little Help needed moving from lying on your back to sitting on the side of a flat bed without using bedrails?: A Lot Help needed moving to and from a bed to a chair (including a wheelchair)?: A Little Help needed standing up from a chair using your arms (e.g., wheelchair or bedside chair)?: A Little Help needed to walk in hospital room?: A Little Help needed climbing 3-5 steps with a railing? : A Lot 6 Click Score: 16    End of Session Equipment Utilized During Treatment: Gait belt;Back brace Activity Tolerance:  Patient tolerated treatment well Patient left: with call bell/phone within reach;with family/visitor present;in chair;with chair alarm set Nurse Communication: Mobility status PT Visit Diagnosis: Other abnormalities of gait and mobility (R26.89);Muscle weakness (generalized) (M62.81);Pain Pain - Right/Left:  (bilat) Pain - part of body:  (low back)     Time: 9562-1308 PT Time Calculation (min) (ACUTE ONLY): 34 min  Charges:     Rosiland Oz SPT 07/30/23, 4:13 PM

## 2023-07-31 LAB — CBC
HCT: 28 % — ABNORMAL LOW (ref 39.0–52.0)
Hemoglobin: 9.3 g/dL — ABNORMAL LOW (ref 13.0–17.0)
MCH: 31.6 pg (ref 26.0–34.0)
MCHC: 33.2 g/dL (ref 30.0–36.0)
MCV: 95.2 fL (ref 80.0–100.0)
Platelets: 133 10*3/uL — ABNORMAL LOW (ref 150–400)
RBC: 2.94 MIL/uL — ABNORMAL LOW (ref 4.22–5.81)
RDW: 13.1 % (ref 11.5–15.5)
WBC: 9.1 10*3/uL (ref 4.0–10.5)
nRBC: 0 % (ref 0.0–0.2)

## 2023-07-31 MED ORDER — METHOCARBAMOL 1000 MG/10ML IJ SOLN
500.0000 mg | Freq: Four times a day (QID) | INTRAMUSCULAR | Status: DC
  Filled 2023-07-31 (×8): qty 5

## 2023-07-31 MED ORDER — CELECOXIB 200 MG PO CAPS
200.0000 mg | ORAL_CAPSULE | Freq: Two times a day (BID) | ORAL | Status: DC
  Administered 2023-07-31 – 2023-08-02 (×5): 200 mg via ORAL
  Filled 2023-07-31 (×5): qty 1

## 2023-07-31 MED ORDER — METHOCARBAMOL 500 MG PO TABS
500.0000 mg | ORAL_TABLET | Freq: Four times a day (QID) | ORAL | Status: DC
Start: 2023-07-31 — End: 2023-08-02
  Administered 2023-07-31 – 2023-08-02 (×7): 500 mg via ORAL
  Filled 2023-07-31 (×7): qty 1

## 2023-07-31 NOTE — Progress Notes (Signed)
   Neurosurgery Progress Note  History: Todd Madden is here for s/p L2-5 XLIF, L5-S1 TLIF. L2-S2 PSF  POD2: ongoing back pain and some right groin pain this morning. Largely controlled with current medication regimen. POD1: Pt doing well this morning with expected back pain.  He feels his pain is largely managed by as needed medications.  Physical Exam: Vitals:   07/31/23 0013 07/31/23 0757  BP: 111/66 106/68  Pulse: (!) 102 (!) 106  Resp: 17 18  Temp: 98.8 F (37.1 C) 98 F (36.7 C)  SpO2: 99% 97%    AA Ox3 CNI  Strength:5/5 throughout BLE HV output right 5 and left 25   Data:  Other tests/results:     Latest Ref Rng & Units 07/31/2023    6:13 AM 07/30/2023    5:42 AM 05/15/2022    4:24 PM  CBC  WBC 4.0 - 10.5 K/uL 9.1  9.5  6.6   Hemoglobin 13.0 - 17.0 g/dL 9.3  9.4  46.9   Hematocrit 39.0 - 52.0 % 28.0  27.5  41.4   Platelets 150 - 400 K/uL 133  232  299.0      Assessment/Plan:  Todd Madden is a 62 year old presenting with coronal plane imbalance, spondylolisthesis, and right lumbar radiculopathy status post L2-5 XLIF, L5-S1 TLIF. L2-S2 PSF  - mobilize - pain control; transition to PO medications today - DVT prophylaxis - continue to hold home BP medications for hypotension - continue Hemovacs and wound vac -Continue to monitor H&H. - PTOT; brace when OOB and ambulating. Pt should have brace from home. Ok to work with PT prior to arrival if needed.   Todd Charity PA-C Department of Neurosurgery

## 2023-07-31 NOTE — Progress Notes (Signed)
Physical Therapy Treatment Patient Details Name: MARLIN DENNEN MRN: [id number] DOB: 06-Oct-1960 Today's Date: 07/31/2023   History of Present Illness Pt is a 62 yo M s/p L2-5 lateral fusions, L2-S2 posterolateral fusions, and L5/S1 interbody fusion due to scoliosis, spondylolisthesis, chronic LBP, sciatica, and lumbar stenosis. PMH includes HTN, arthritis, and prior skin cancer.    PT Comments  Pt was pleasant and motivated to participate during the session and put forth good effort throughout. Pt was able to complete bed mobility with CGA for safety, min cuing needed. Pt was able to complete initial transfer with min A, followed by improved subsequent transfer with CGA. Pt performed gait and stair training with CGA for safety and cuing for improved efficiency. Pt tolerated gait/stairs well exhibiting good overall BLE strength and stability. Pt vitals were monitored throughout session, remaining WNL and pt reporting no adverse symptoms or increase in pain. Pt will benefit from continued PT services upon discharge to safely address deficits listed in patient problem list for decreased caregiver assistance and eventual return to PLOF.    If plan is discharge home, recommend the following: A little help with walking and/or transfers;A little help with bathing/dressing/bathroom;Assistance with cooking/housework;Help with stairs or ramp for entrance;Assist for transportation   Can travel by private vehicle        Equipment Recommendations  None recommended by PT    Recommendations for Other Services       Precautions / Restrictions Precautions Precautions: Fall;Back Required Braces or Orthoses: Spinal Brace Spinal Brace: Applied in sitting position;Lumbar corset Spinal Brace Comments: Wear brace during ambulation.  Don/doff brace in sitting.  May amb to bathroom without brace. Brace may be off in bed and in sitting. May remove brace for showering. Restrictions Weight Bearing Restrictions: No      Mobility  Bed Mobility Overal bed mobility: Needs Assistance Bed Mobility: Rolling, Sidelying to Sit Rolling: Used rails, Contact guard assist Sidelying to sit: HOB elevated, Used rails, Contact guard assist       General bed mobility comments: effortful but good log roll, req extra time, min cuing for technique req    Transfers Overall transfer level: Needs assistance Equipment used: Rolling walker (2 wheels) Transfers: Sit to/from Stand Sit to Stand: Min assist, Contact guard assist           General transfer comment: initially mod effortful, showed improvement with subsequent attempts, good hip hinge/forward lean, optimal hand placement without cuing    Ambulation/Gait Ambulation/Gait assistance: Contact guard assist Gait Distance (Feet): 65 Feet x2 Assistive device: Rolling walker (2 wheels) Gait Pattern/deviations: Step-through pattern, Decreased stride length, Decreased step length - right, Decreased step length - left Gait velocity: decreased     General Gait Details: min decrease in step/stride lengths, overall fluid gait pattern, min BUE reliance on RW for steadying, cuing for body placement within RW and upright posture   Stairs Stairs: Yes Stairs assistance: Contact guard assist Stair Management: Two rails, Step to pattern, Forwards Number of Stairs: 4 General stair comments: ascending with L LE lead, descending with R LE lead, good strength noted in BLEs, good eccentric control during descent, min unsteadiness throughout, good use of BUEs on rails   Wheelchair Mobility     Tilt Bed    Modified Rankin (Stroke Patients Only)       Balance Overall balance assessment: Mild deficits observed, not formally tested Sitting-balance support: Feet supported, No upper extremity supported Sitting balance-Leahy Scale: Good Sitting balance - Comments: able to don/doff  lumbar brace without LOB   Standing balance support: Bilateral upper extremity  supported, During functional activity Standing balance-Leahy Scale: Good Standing balance comment: steady in static stand, no observable unsteadiness with dynamic standing activities with RW                            Cognition Arousal: Alert Behavior During Therapy: WFL for tasks assessed/performed Overall Cognitive Status: Within Functional Limits for tasks assessed                                          Exercises     General Comments        Pertinent Vitals/Pain Pain Assessment Pain Assessment: 0-10 Pain Score: 4  Pain Location: low back, R groin Pain Descriptors / Indicators: Aching, Sore Pain Intervention(s): Monitored during session, Repositioned    Home Living                          Prior Function            PT Goals (current goals can now be found in the care plan section) Acute Rehab PT Goals Patient Stated Goal: get back to walking and yard work PT Goal Formulation: With patient Time For Goal Achievement: 08/12/23 Potential to Achieve Goals: Good Progress towards PT goals: Progressing toward goals    Frequency    BID      PT Plan      Co-evaluation              AM-PAC PT "6 Clicks" Mobility   Outcome Measure  Help needed turning from your back to your side while in a flat bed without using bedrails?: A Little Help needed moving from lying on your back to sitting on the side of a flat bed without using bedrails?: A Little Help needed moving to and from a bed to a chair (including a wheelchair)?: A Little Help needed standing up from a chair using your arms (e.g., wheelchair or bedside chair)?: A Little Help needed to walk in hospital room?: A Little Help needed climbing 3-5 steps with a railing? : A Little 6 Click Score: 18    End of Session Equipment Utilized During Treatment: Gait belt;Back brace Activity Tolerance: Patient tolerated treatment well Patient left: with call bell/phone within  reach;in chair;with chair alarm set;with family/visitor present Nurse Communication: Mobility status PT Visit Diagnosis: Other abnormalities of gait and mobility (R26.89);Muscle weakness (generalized) (M62.81);Pain Pain - Right/Left:  (bilat) Pain - part of body:  (low back and R groin)     Time: 1610-9604 PT Time Calculation (min) (ACUTE ONLY): 24 min  Charges:         Rosiland Oz SPT 07/31/23, 4:07 PM

## 2023-07-31 NOTE — Plan of Care (Signed)

## 2023-07-31 NOTE — Progress Notes (Signed)
Physical Therapy Treatment Patient Details Name: Todd Madden MRN: [id number] DOB: Aug 14, 1961 Today's Date: 07/31/2023   History of Present Illness Pt is a 62 yo M s/p L2-5 lateral fusions, L2-S2 posterolateral fusions, and L5/S1 interbody fusion due to scoliosis, spondylolisthesis, chronic LBP, sciatica, and lumbar stenosis. PMH includes HTN, arthritis, and prior skin cancer.    PT Comments  Pt was pleasant and motivated to participate during the session and put forth good effort throughout. Prior to mobility, pt reported pain is at its worst since admission, 7/10 NPS. Pt was able to complete bed mobility, transfers, and gait training with CGA for safety, line management, and cuing for efficiency. Pt showed good recall of log rolling technique and brace donning/doffing. Pt was able to amb with stable gait pattern with RW and denied any adverse symptoms/increased pain with gait. Pt vitals were monitored throughout session and remained WNL. Pt will benefit from continued PT services upon discharge to safely address deficits listed in patient problem list for decreased caregiver assistance and eventual return to PLOF.    If plan is discharge home, recommend the following: A little help with walking and/or transfers;A little help with bathing/dressing/bathroom;Assistance with cooking/housework;Help with stairs or ramp for entrance;Assist for transportation   Can travel by private vehicle        Equipment Recommendations  None recommended by PT    Recommendations for Other Services       Precautions / Restrictions Precautions Precautions: Fall;Back Required Braces or Orthoses: Spinal Brace Spinal Brace: Applied in sitting position;Lumbar corset Spinal Brace Comments: Wear brace during ambulation.  Don/doff brace in sitting.  May amb to bathroom without brace. Brace may be off in bed and in sitting. May remove brace for showering. Restrictions Weight Bearing Restrictions: No      Mobility  Bed Mobility Overal bed mobility: Needs Assistance Bed Mobility: Rolling, Sidelying to Sit Rolling: Used rails, Contact guard assist Sidelying to sit: HOB elevated, Used rails, Contact guard assist       General bed mobility comments: good technique carryover, req extra time    Transfers Overall transfer level: Needs assistance Equipment used: Rolling walker (2 wheels) Transfers: Sit to/from Stand Sit to Stand: Supervision, From elevated surface           General transfer comment: good hip hinge/forward lean, optimal hand placement without cuing, slightly elevated bed for energy conservation    Ambulation/Gait Ambulation/Gait assistance: Contact guard assist Gait Distance (Feet): 140 Feet Assistive device: Rolling walker (2 wheels) Gait Pattern/deviations: Step-through pattern, Decreased stride length, Decreased step length - right, Decreased step length - left Gait velocity: decreased     General Gait Details: min BUE reliance on RW for steadying, self-selected wide turns with AD, no overt unsteadiness throughout, cuing for body placement within RW   Stairs             Wheelchair Mobility     Tilt Bed    Modified Rankin (Stroke Patients Only)       Balance Overall balance assessment: Mild deficits observed, not formally tested Sitting-balance support: Feet supported, No upper extremity supported Sitting balance-Leahy Scale: Good Sitting balance - Comments: able to don/doff lumbar brace without LOB   Standing balance support: Bilateral upper extremity supported, During functional activity Standing balance-Leahy Scale: Good Standing balance comment: steady in static stand, no real unsteadiness observed with dynamic standing, min reliace on RW  Cognition Arousal: Alert Behavior During Therapy: WFL for tasks assessed/performed Overall Cognitive Status: Within Functional Limits for tasks assessed                                           Exercises Total Joint Exercises Long Arc Quad: AROM, Both, 10 reps, Seated Marching in Standing: AROM, Both, 5 reps, Standing    General Comments        Pertinent Vitals/Pain Pain Assessment Pain Assessment: 0-10 Pain Score: 7  Pain Location: low back Pain Descriptors / Indicators: Aching, Sore Pain Intervention(s): Monitored during session, Repositioned, Premedicated    Home Living                          Prior Function            PT Goals (current goals can now be found in the care plan section) Acute Rehab PT Goals Patient Stated Goal: get back to walking and yard work PT Goal Formulation: With patient Time For Goal Achievement: 08/12/23 Potential to Achieve Goals: Good Progress towards PT goals: Progressing toward goals    Frequency    BID      PT Plan      Co-evaluation              AM-PAC PT "6 Clicks" Mobility   Outcome Measure  Help needed turning from your back to your side while in a flat bed without using bedrails?: A Little Help needed moving from lying on your back to sitting on the side of a flat bed without using bedrails?: A Lot Help needed moving to and from a bed to a chair (including a wheelchair)?: A Little Help needed standing up from a chair using your arms (e.g., wheelchair or bedside chair)?: A Little Help needed to walk in hospital room?: A Little Help needed climbing 3-5 steps with a railing? : A Lot 6 Click Score: 16    End of Session Equipment Utilized During Treatment: Gait belt;Back brace Activity Tolerance: Patient tolerated treatment well Patient left: with call bell/phone within reach;in chair;with chair alarm set Nurse Communication: Mobility status PT Visit Diagnosis: Other abnormalities of gait and mobility (R26.89);Muscle weakness (generalized) (M62.81);Pain Pain - Right/Left:  (bilat) Pain - part of body:  (low back)     Time: 1610-9604 PT  Time Calculation (min) (ACUTE ONLY): 25 min  Charges:                           Rosiland Oz SPT 07/31/23, 1:34 PM

## 2023-08-01 NOTE — Plan of Care (Signed)

## 2023-08-01 NOTE — TOC Initial Note (Signed)
Transition of Care [facility]) - Initial/Assessment Note    Patient Details  Name: Todd Madden MRN: [id number] Date of Birth: 14-Apr-1961  Transition of Care [facility]) CM/SW Contact:    Garret Reddish, RN Phone Number: 08/01/2023, 9:51 PM  Clinical Narrative:                 Chart reviewed.  Noted that patient was admitted with Spinal fusion.   I have meet with patient and his wife at bedside.  Patient reports that prior to admission he was able to bath and dress himself.  Patient reports that he was able to get around in his home.  Patient reports that he has a rolling walker, BSC, and cane at home.    I have informed patient that PT has recommended Home Health PT on discharge.  Patient did not have a home health preference.  I have asked Morrie Sheldon with [facility] to accept home health referral.  Home health services will be for home health PT and OT.    Mrs. Seyer will be with patient on discharge.    TOC will continue to follow for discharge planning.   Expected Discharge Plan: Home w Home Health Services Barriers to Discharge: No Barriers Identified   Patient Goals and CMS Choice   CMS Medicare.gov Compare Post Acute Care list provided to:: Patient Choice offered to / list presented to : Patient      Expected Discharge Plan and Services   Discharge Planning Services: CM Consult Post Acute Care Choice: Home Health Living arrangements for the past 2 months: Single Family Home                           HH Arranged: PT, OT HH Agency: [facility] Date HH Agency Contacted: 08/01/23 Time HH Agency Contacted: 1100 Representative spoke with at [facility] Agency: Morrie Sheldon  Prior Living Arrangements/Services Living arrangements for the past 2 months: Single Family Home Lives with:: Spouse Patient language and need for interpreter reviewed:: Yes Do you feel safe going back to the place where you live?: Yes        Care giver support system in place?: Yes (comment)  (Patient has a supportive wife) Current home services:  (Patient reports that he has a walker with wheels, BSC, and cane at home) Criminal Activity/Legal Involvement Pertinent to Current Situation/Hospitalization: No - Comment as needed  Activities of Daily Living      Permission Sought/Granted                  Emotional Assessment Appearance:: Appears stated age Attitude/Demeanor/Rapport: Engaged Affect (typically observed): Appropriate Orientation: : Oriented to Self, Oriented to Place, Oriented to  Time, Oriented to Situation      Admission diagnosis:  S/P spinal fusion [Z98.1] Patient Active Problem List   Diagnosis Date Noted   S/P spinal fusion 07/29/2023   Acquired scoliosis 07/29/2023   Coronal plane imbalance 07/29/2023   Spondylolisthesis of lumbar region 07/29/2023   Chronic right-sided low back pain with right-sided sciatica 07/29/2023   Lumbar stenosis with neurogenic claudication 07/29/2023   Arthritis of right sacroiliac joint (HCC) 08/29/2022   Degenerative lumbar spinal stenosis 07/17/2022   Tear of right gluteus medius tendon 06/05/2022   Gluteal pain 05/15/2022   Leg length discrepancy 05/15/2022   Somatic dysfunction of spine, sacral 05/15/2022   Bilateral hand pain 03/03/2019   Acquired trigger finger 10/30/2018   Acquired hallux rigidus 01/22/2018  Bicipital tenosynovitis 01/22/2018   Derangement of posterior horn of lateral meniscus 01/22/2018   Disorder of bursae of shoulder region 01/22/2018   Knee pain 01/22/2018   Lesion of ulnar nerve 01/22/2018   Muscle weakness 01/22/2018   Neck pain 01/22/2018   Old anterior cruciate ligament disruption 01/22/2018   Osteoarthritis of knee 01/22/2018   H/O adenomatous polyp of colon 05/04/2016   Carpal tunnel syndrome on both sides 10/01/2013   Gout 11/03/2012   Benign essential hypertension 07/03/2011   Heavy alcohol use 07/03/2011   Mixed hyperlipidemia 07/03/2011   Obesity, unspecified  07/03/2011   PCP:  Cyndia Diver, PA-C Pharmacy:   [facility] - [place], [place] - [address] [place] [address] Phone: [phone] Fax: [phone]  [facility] - [place], [place] - [address] [place] [address] Phone: [phone] Fax: [phone]     Social Determinants of Health (SDOH) Social History: SDOH Screenings   Food Insecurity: No Food Insecurity (03/12/2023)   Received from [facility], [facility]  Transportation Needs: No Transportation Needs (03/12/2023)   Received from [facility], [facility]  Utilities: Not At Risk (03/12/2023)   Received from [facility], [facility]  Financial Resource Strain: Low Risk  (03/12/2023)   Received from [facility], [facility]  Tobacco Use: Low Risk  (07/29/2023)   SDOH Interventions:     Readmission Risk Interventions     No data to display

## 2023-08-01 NOTE — Progress Notes (Signed)
   Neurosurgery Progress Note  History: Todd Madden is here for s/p L2-5 XLIF, L5-S1 TLIF. L2-S2 PSF  POD3: doing well this morning without concerns. Overall reports a good night POD2: ongoing back pain and some right groin pain this morning. Largely controlled with current medication regimen. POD1: Pt doing well this morning with expected back pain.  He feels his pain is largely managed by as needed medications.  Physical Exam: Vitals:   07/31/23 2128 08/01/23 0821  BP: (!) 101/58 101/64  Pulse: 89 85  Resp: 20 14  Temp: 98.7 F (37.1 C) 98 F (36.7 C)  SpO2: 96% 95%    AA Ox3 CNI  Strength:5/5 throughout BLE Left HV output not recorded overnight. Minimal output in container this morning.  Data:  Other tests/results:     Latest Ref Rng & Units 07/31/2023    6:13 AM 07/30/2023    5:42 AM 05/15/2022    4:24 PM  CBC  WBC 4.0 - 10.5 K/uL 9.1  9.5  6.6   Hemoglobin 13.0 - 17.0 g/dL 9.3  9.4  09.8   Hematocrit 39.0 - 52.0 % 28.0  27.5  41.4   Platelets 150 - 400 K/uL 133  232  299.0      Assessment/Plan:  Todd Madden is a 62 year old presenting with coronal plane imbalance, spondylolisthesis, and right lumbar radiculopathy status post L2-5 XLIF, L5-S1 TLIF. L2-S2 PSF  - mobilize - pain control; scheduled Robaxin.  - DVT prophylaxis - continue to hold home BP medications for hypotension - continue wound vac - right HV removed 11/13.  Left HV removed 11/14 - patient has not had BM since Sunday.  Discussed escalating the bowel regimen with his nurse. He is not having any significant abdominal pain and is passing gas. - PTOT; dispo planning underway  [name] PA-C Department of Neurosurgery

## 2023-08-01 NOTE — Discharge Summary (Signed)
Discharge Summary  Patient ID: Todd Madden MRN: [id number] DOB/AGE: [birth year] 62 y.o.  Admit date: 07/29/2023 Discharge date: 08/02/2023  Admission Diagnoses: M41.9 Acquired scoliosis, M43.8X9 Coronal plane imbalance, M43.16 Spondylolisthesis of lumbar region, M54.41, G89.29 Chronic right-sided low back pain with right-sided sciatica, M48.062 Neurogenic claudication due to lumbar spinal stenosis   Discharge Diagnoses:  Principal Problem:   S/P spinal fusion Active Problems:   Acquired scoliosis   Coronal plane imbalance   Spondylolisthesis of lumbar region   Chronic right-sided low back pain with right-sided sciatica   Lumbar stenosis with neurogenic claudication   Discharged Condition: good  Hospital Course:  Todd Madden is a very pleasant 63 year old presenting with coronal plane imbalance, spondylolisthesis, and chronic low back pain status post L2-3, L3-4, L4-5 XLIF, left L5-S1 transforaminal interbody fusion, and L2-S2 PSF.  His intraoperative course was largely uncomplicated.  He was admitted for pain control, therapy evaluation, and drain output monitoring.  His postoperative course was complicated by postoperative hypotension likely secondary to acute intraoperative blood loss.  His drain output was monitored closely and removed when the output reduced to an acceptable level.  His postoperative hypotension improved with time and fluid resuscitation.  He was seen and evaluated by therapy and deemed appropriate for discharge home on postop day 4.  He was given prescriptions for pain medication, muscle relaxer, and stool softener.  Consults: None  Significant Diagnostic Studies: see results review  Treatments: surgery: Above.  Please see separately dictated operative report for further details.  Discharge Exam: Blood pressure 117/78, pulse 80, temperature 98.1 F (36.7 C), temperature source Oral, resp. rate 14, SpO2 95%. AA Ox3 CNI   Strength:5/5 throughout  BLE Incision: Clean dry and intact with clean dressing in place.  Disposition: Discharge disposition: 01-Home or Self Care       Discharge Instructions     Incentive spirometry RT   Complete by: As directed    Remove dressing in 48 hours   Complete by: As directed       Allergies as of 08/02/2023   No Known Allergies      Medication List     STOP taking these medications    aspirin EC 81 MG tablet       TAKE these medications    acetaminophen 500 MG tablet Commonly known as: TYLENOL Take 1,000 mg by mouth every 6 (six) hours as needed.   allopurinol 100 MG tablet Commonly known as: ZYLOPRIM Take 100 mg by mouth daily.   atorvastatin 80 MG tablet Commonly known as: LIPITOR Take 80 mg by mouth daily.   celecoxib 200 MG capsule Commonly known as: CELEBREX Take 1 capsule (200 mg total) by mouth 2 (two) times daily.   cholecalciferol 25 MCG (1000 UNIT) tablet Commonly known as: VITAMIN D3 Take 1,000 Units by mouth daily.   colchicine 0.6 MG tablet TAKE 2 TABS @ ONSET OF FLARE FOLLOWED BY 1 TAB 1 HOUR LATER MAX OF 3 TABS IN 1 attack.  May repeat in 3 days   fexofenadine 180 MG tablet Commonly known as: ALLEGRA Take 180 mg by mouth daily.   hydrochlorothiazide 12.5 MG tablet Commonly known as: HYDRODIURIL Take 12.5 mg by mouth daily.   lisinopril 20 MG tablet Commonly known as: ZESTRIL Take 20 mg by mouth daily.   methocarbamol 500 MG tablet Commonly known as: ROBAXIN Take 1 tablet (500 mg total) by mouth every 6 (six) hours.   oxyCODONE 5 MG immediate release tablet Commonly known as: Oxy IR/ROXICODONE  Take 1-2 tablets (5-10 mg total) by mouth every 4 (four) hours as needed for moderate pain (pain score 4-6) or severe pain (pain score 7-10) ((score 4 to 6)).   senna 8.6 MG Tabs tablet Commonly known as: SENOKOT Take 1 tablet (8.6 mg total) by mouth 2 (two) times daily as needed for mild constipation.   verapamil 240 MG CR tablet Commonly  known as: CALAN-SR Take 240 mg by mouth daily.        Follow-up Information     Susanne Borders, PA Follow up on 08/08/2023.   Specialty: Neurosurgery Contact information: [address] [place] [address] [phone]                 Signed: Susanne Borders 08/02/2023, 8:36 AM

## 2023-08-01 NOTE — Progress Notes (Addendum)
Physical Therapy Treatment Patient Details Name: Todd Madden MRN: [id number] DOB: 08-04-1961 Today's Date: 08/01/2023   History of Present Illness Pt is a 62 yo M s/p L2-5 lateral fusions, L2-S2 posterolateral fusions, and L5/S1 interbody fusion due to scoliosis, spondylolisthesis, chronic LBP, sciatica, and lumbar stenosis. PMH includes HTN, arthritis, and prior skin cancer.    PT Comments  Pt was pleasant and motivated to participate during the session and put forth good effort throughout. Pt was able to perform bed mobility, transfers, and ambulation with CGA for safety, min cuing needed throughout. Pt exhibited good recall and ability of log rolling and brace donning/doffing. Pt was able to progress from RW to [facility] within session exhibiting capable gait pattern. Pt performed multiple stair training bouts with sequencing education, and tolerated all activity well. Pt reported no adverse symptoms throughout session. Pt will benefit from continued PT services upon discharge to safely address deficits listed in patient problem list for decreased caregiver assistance and eventual return to PLOF.    If plan is discharge home, recommend the following: A little help with walking and/or transfers;A little help with bathing/dressing/bathroom;Assistance with cooking/housework;Help with stairs or ramp for entrance;Assist for transportation   Can travel by private vehicle        Equipment Recommendations  None recommended by PT    Recommendations for Other Services       Precautions / Restrictions Precautions Precautions: Fall;Back Precaution Booklet Issued: Yes (comment) Required Braces or Orthoses: Spinal Brace Spinal Brace: Applied in sitting position;Lumbar corset Spinal Brace Comments: Wear brace during ambulation.  Don/doff brace in sitting.  May amb to bathroom without brace. Brace may be off in bed and in sitting. May remove brace for showering. Restrictions Weight Bearing  Restrictions: No     Mobility  Bed Mobility Overal bed mobility: Needs Assistance Bed Mobility: Rolling, Sidelying to Sit Rolling: Used rails, Contact guard assist Sidelying to sit: HOB elevated, Used rails, Contact guard assist       General bed mobility comments: effortful but good log roll, req extra time, min cuing for technique    Transfers Overall transfer level: Needs assistance Equipment used: Rolling walker (2 wheels) Transfers: Sit to/from Stand Sit to Stand: Contact guard assist           General transfer comment: decreased BLE strength/stability for ascension resulting in slow and effortful STS, good use of BUE pushoff and hip hinge    Ambulation/Gait Ambulation/Gait assistance: Contact guard assist Gait Distance (Feet): 150 Feet Assistive device: Rolling walker (2 wheels) Gait Pattern/deviations: Step-through pattern, Decreased stride length, Decreased step length - right, Decreased step length - left Gait velocity: decreased     General Gait Details: initially used RW - min decrease in step/stride lengths, overall fluid gait pattern and min BUE reliance on RW; trialed SPC in L hand resulting in decreased cadence and min unsteadiness but no LOB and overall proficient gait pattern, cuing for sequencing and upright posture   Stairs Stairs: Yes Stairs assistance: Contact guard assist Stair Management: One rail Right, Step to pattern, Forwards, With cane, Sideways Number of Stairs: 4 General stair comments: initial bout performed with SPC in L hand and R rail, good stability and cadence observed, minimal unsteadiness during descent; second bout performed sideways with bilat hands on R rail, similar result of stable and efficient pattern, improved stability during descent cuing for sequencing and AD management   Wheelchair Mobility     Tilt Bed    Modified Rankin (Stroke Patients  Only)       Balance Overall balance assessment: Needs  assistance Sitting-balance support: Feet supported, No upper extremity supported Sitting balance-Leahy Scale: Good     Standing balance support: During functional activity, Single extremity supported Standing balance-Leahy Scale: Good Standing balance comment: steady in static stand with RW/SPC, min unsteadiness with dynamic standing activities                            Cognition Arousal: Alert Behavior During Therapy: WFL for tasks assessed/performed Overall Cognitive Status: Within Functional Limits for tasks assessed                                          Exercises Other Exercises Other Exercises: educated pt on stair navigation using SPC and R rail simultaneously, as well as stair navigation sideways with no AD    General Comments        Pertinent Vitals/Pain Pain Assessment Pain Assessment: 0-10 Pain Score: 4  Pain Location: low back, R groin Pain Descriptors / Indicators: Aching, Sore Pain Intervention(s): Monitored during session, Repositioned    Home Living                          Prior Function            PT Goals (current goals can now be found in the care plan section) Acute Rehab PT Goals Patient Stated Goal: get back to walking and yard work PT Goal Formulation: With patient Time For Goal Achievement: 08/12/23 Potential to Achieve Goals: Good Progress towards PT goals: Progressing toward goals    Frequency    QD      PT Plan      Co-evaluation              AM-PAC PT "6 Clicks" Mobility   Outcome Measure  Help needed turning from your back to your side while in a flat bed without using bedrails?: A Little Help needed moving from lying on your back to sitting on the side of a flat bed without using bedrails?: A Little Help needed moving to and from a bed to a chair (including a wheelchair)?: A Little Help needed standing up from a chair using your arms (e.g., wheelchair or bedside chair)?: A  Little Help needed to walk in hospital room?: A Little Help needed climbing 3-5 steps with a railing? : A Little 6 Click Score: 18    End of Session Equipment Utilized During Treatment: Gait belt;Back brace Activity Tolerance: Patient tolerated treatment well Patient left: with call bell/phone within reach;in chair;with chair alarm set;with family/visitor present Nurse Communication: Mobility status PT Visit Diagnosis: Other abnormalities of gait and mobility (R26.89);Muscle weakness (generalized) (M62.81);Pain Pain - Right/Left:  (bilat) Pain - part of body:  (low back and R groin)     Time: 3244-0102 PT Time Calculation (min) (ACUTE ONLY): 24 min  Charges:                           Rosiland Oz SPT 08/01/23, 10:44 AM  This entire session was performed under direct supervision and direction of a licensed therapist/therapist assistant. I have personally read, edited and approve of the note as written.  Loran Senters, DPT

## 2023-08-02 MED ORDER — SENNA 8.6 MG PO TABS: 1.0000 | ORAL_TABLET | Freq: Two times a day (BID) | ORAL | 0 refills | Status: DC | PRN

## 2023-08-02 MED ORDER — CELECOXIB 200 MG PO CAPS: 200.0000 mg | ORAL_CAPSULE | Freq: Two times a day (BID) | ORAL | 0 refills | Status: DC

## 2023-08-02 MED ORDER — OXYCODONE HCL 5 MG PO TABS: 5.0000 mg | ORAL_TABLET | ORAL | 0 refills | Status: DC | PRN

## 2023-08-02 MED ORDER — METHOCARBAMOL 500 MG PO TABS: 500.0000 mg | ORAL_TABLET | Freq: Four times a day (QID) | ORAL | 0 refills | Status: DC

## 2023-08-02 NOTE — Progress Notes (Signed)
Nursing Discharge Note   Name: Todd Madden MRN: [id number] DOB: 05-12-1961    Admit Date: 07/29/2023 Discharge Date:08/02/2023    Todd Madden is to be discharged home per MD order.  AVS completed. Reviewed with patient and family at bedside. Highlighted copy provided for patient to take home.  Patient/caregiver able to verbalize understanding of discharge instructions. PIV removed. Patient stable upon discharge.  Patient and wife aware that medications we sent to home pharmacy: [facility] in [place] and that they will need to pick them up.   Discharge Instructions     Incentive spirometry RT   Complete by: As directed    Remove dressing in 48 hours   Complete by: As directed         Allergies as of 08/02/2023   No Known Allergies      Medication List     STOP taking these medications    aspirin EC 81 MG tablet       TAKE these medications    acetaminophen 500 MG tablet Commonly known as: TYLENOL Take 1,000 mg by mouth every 6 (six) hours as needed.   allopurinol 100 MG tablet Commonly known as: ZYLOPRIM Take 100 mg by mouth daily.   atorvastatin 80 MG tablet Commonly known as: LIPITOR Take 80 mg by mouth daily.   celecoxib 200 MG capsule Commonly known as: CELEBREX Take 1 capsule (200 mg total) by mouth 2 (two) times daily.   cholecalciferol 25 MCG (1000 UNIT) tablet Commonly known as: VITAMIN D3 Take 1,000 Units by mouth daily.   colchicine 0.6 MG tablet TAKE 2 TABS @ ONSET OF FLARE FOLLOWED BY 1 TAB 1 HOUR LATER MAX OF 3 TABS IN 1 attack.  May repeat in 3 days   fexofenadine 180 MG tablet Commonly known as: ALLEGRA Take 180 mg by mouth daily.   hydrochlorothiazide 12.5 MG tablet Commonly known as: HYDRODIURIL Take 12.5 mg by mouth daily.   lisinopril 20 MG tablet Commonly known as: ZESTRIL Take 20 mg by mouth daily.   methocarbamol 500 MG tablet Commonly known as: ROBAXIN Take 1 tablet (500 mg total) by mouth every 6  (six) hours.   oxyCODONE 5 MG immediate release tablet Commonly known as: Oxy IR/ROXICODONE Take 1-2 tablets (5-10 mg total) by mouth every 4 (four) hours as needed for moderate pain (pain score 4-6) or severe pain (pain score 7-10) ((score 4 to 6)).   senna 8.6 MG Tabs tablet Commonly known as: SENOKOT Take 1 tablet (8.6 mg total) by mouth 2 (two) times daily as needed for mild constipation.   verapamil 240 MG CR tablet Commonly known as: CALAN-SR Take 240 mg by mouth daily.        Discharge Instructions/ Education: Instructions   Your surgeon has performed an operation on your lumbar spine (low back) to relieve pressure on one or more nerves. Many times, patients feel better immediately after surgery and can "overdo it." Even if you feel well, it is important that you follow these activity guidelines. If you do not let your back heal properly from the surgery, you can increase the chance of hardware complications and/or return of your symptoms. The following are instructions to help in your recovery once you have been discharged from the hospital.   Do not use NSAIDs for 3 months after surgery.  Resume home aspirin as indicated on pre-op paperwork   Activity    No bending, lifting, or twisting ("BLT"). Avoid lifting objects heavier than  10 pounds (gallon milk jug).  Where possible, avoid household activities that involve lifting, bending, pushing, or pulling such as laundry, vacuuming, grocery shopping, and childcare. Try to arrange for help from friends and family for these activities while your back heals.   Increase physical activity slowly as tolerated.  Taking short walks is encouraged, but avoid strenuous exercise. Do not jog, run, bicycle, lift weights, or participate in any other exercises unless specifically allowed by your doctor. Avoid prolonged sitting, including car rides.   Talk to your doctor before resuming sexual activity.   You should not drive until cleared by  your doctor.   Until released by your doctor, you should not return to work or school.  You should rest at home and let your body heal.    You may shower three days after your surgery.  After showering, lightly dab your incision dry. Do not take a tub bath or go swimming for 3 weeks, or until approved by your doctor at your follow-up appointment.   If you smoke, we strongly recommend that you quit.  Smoking has been proven to interfere with normal healing in your back and will dramatically reduce the success rate of your surgery. Please contact [facility] (800-QUIT-NOW) and use the resources at www.[facility].com for assistance in stopping smoking.   Surgical Incision                If you have a dressing on your incision, you may remove it three days after your surgery. Keep your incision area clean and dry.   Your incision was closed with Dermabond glue. The glue should begin to peel away within about a week.   Diet             You may return to your usual diet. Be sure to stay hydrated.   When to Contact us   Although your surgery and recovery will likely be uneventful, you may have some residual numbness, aches, and pains in your back and/or legs. This is normal and should improve in the next few weeks.   However, should you experience any of the following, contact us immediately: New numbness or weakness Pain that is progressively getting worse, and is not relieved by your pain medications or rest Bleeding, redness, swelling, pain, or drainage from surgical incision Chills or flu-like symptoms Fever greater than 101.0 F (38.3 C) Problems with bowel or bladder functions Difficulty breathing or shortness of breath Warmth, tenderness, or swelling in your calf   Contact Information How to contact us:  If you have any questions/concerns before or after surgery, you can reach [place] at [phone], or you can send a mychart message. We can be reached by phone or mychart 8am-4pm,  Monday-Friday.  *Please note: Calls after 4pm are forwarded to a third party answering service. Mychart messages are not routinely monitored during evenings, weekends, and holidays. Please call our office to contact the answering service for urgent concerns during non-business hours.   Discharge instructions given to patient/family with verbalized understanding. Discharge education completed with patient/family including: follow up instructions, medication list, discharge activities, and limitations if indicated. Additional discharge instructions as indicated by discharging provider also reviewed. Patient and family able to verbalize understanding, all questions fully answered. Patient instructed to return to Emergency Department, call 911, or call MD for any changes in condition.  Patient escorted via wheelchair to lobby and discharged home via private automobile.

## 2023-08-02 NOTE — Progress Notes (Signed)
   Neurosurgery Progress Note  History: Todd Madden is here for s/p L2-5 XLIF, L5-S1 TLIF. L2-S2 PSF  POD4:  Pt had BM yesterday. Had some pain overnight in his back due to sleeping through medication doses.  Is feeling better this morning. POD3: doing well this morning without concerns. Overall reports a good night POD2: ongoing back pain and some right groin pain this morning. Largely controlled with current medication regimen. POD1: Pt doing well this morning with expected back pain.  He feels his pain is largely managed by as needed medications.  Physical Exam: Vitals:   08/02/23 0358 08/02/23 0800  BP: (!) 106/59 117/78  Pulse: 89 80  Resp: 20 14  Temp: 98.1 F (36.7 C) 98.1 F (36.7 C)  SpO2: 97% 95%    AA Ox3 CNI  Strength:5/5 throughout BLE Incision: Clean dry and intact with clean dressing in place.  Data:  Other tests/results:     Latest Ref Rng & Units 07/31/2023    6:13 AM 07/30/2023    5:42 AM 05/15/2022    4:24 PM  CBC  WBC 4.0 - 10.5 K/uL 9.1  9.5  6.6   Hemoglobin 13.0 - 17.0 g/dL 9.3  9.4  91.4   Hematocrit 39.0 - 52.0 % 28.0  27.5  41.4   Platelets 150 - 400 K/uL 133  232  299.0      Assessment/Plan:  Todd Madden is a 62 year old presenting with coronal plane imbalance, spondylolisthesis, and right lumbar radiculopathy status post L2-5 XLIF, L5-S1 TLIF. L2-S2 PSF  - mobilize - pain control; scheduled Robaxin.  - DVT prophylaxis - continue to hold home BP medications for hypotension - continue wound vac - right HV removed 11/13.  Left HV removed 11/14 - patient had BM yesterday - PTOT; dispo planning underway  [name] PA-C Department of Neurosurgery

## 2023-08-02 NOTE — Plan of Care (Signed)
  Problem: Education: Goal: Knowledge of General Education information will improve Description: Including pain rating scale, medication(s)/side effects and non-pharmacologic comfort measures Outcome: Adequate for Discharge   Problem: Health Behavior/Discharge Planning: Goal: Ability to manage health-related needs will improve Outcome: Adequate for Discharge   Problem: Clinical Measurements: Goal: Ability to maintain clinical measurements within normal limits will improve Outcome: Adequate for Discharge Goal: Will remain free from infection Outcome: Adequate for Discharge Goal: Diagnostic test results will improve Outcome: Adequate for Discharge Goal: Respiratory complications will improve Outcome: Adequate for Discharge Goal: Cardiovascular complication will be avoided Outcome: Adequate for Discharge   Problem: Activity: Goal: Risk for activity intolerance will decrease Outcome: Adequate for Discharge   Problem: Nutrition: Goal: Adequate nutrition will be maintained Outcome: Adequate for Discharge   Problem: Coping: Goal: Level of anxiety will decrease Outcome: Adequate for Discharge   Problem: Elimination: Goal: Will not experience complications related to bowel motility Outcome: Adequate for Discharge Goal: Will not experience complications related to urinary retention Outcome: Adequate for Discharge   Problem: Pain Management: Goal: General experience of comfort will improve Outcome: Adequate for Discharge   Problem: Safety: Goal: Ability to remain free from injury will improve Outcome: Adequate for Discharge   Problem: Skin Integrity: Goal: Risk for impaired skin integrity will decrease Outcome: Adequate for Discharge   Problem: Education: Goal: Ability to verbalize activity precautions or restrictions will improve Outcome: Adequate for Discharge Goal: Knowledge of the prescribed therapeutic regimen will improve Outcome: Adequate for Discharge Goal:  Understanding of discharge needs will improve Outcome: Adequate for Discharge   Problem: Activity: Goal: Ability to avoid complications of mobility impairment will improve Outcome: Adequate for Discharge Goal: Ability to tolerate increased activity will improve Outcome: Adequate for Discharge Goal: Will remain free from falls Outcome: Adequate for Discharge   Problem: Bowel/Gastric: Goal: Gastrointestinal status for postoperative course will improve Outcome: Adequate for Discharge   Problem: Clinical Measurements: Goal: Ability to maintain clinical measurements within normal limits will improve Outcome: Adequate for Discharge Goal: Postoperative complications will be avoided or minimized Outcome: Adequate for Discharge Goal: Diagnostic test results will improve Outcome: Adequate for Discharge   Problem: Pain Management: Goal: Pain level will decrease Outcome: Adequate for Discharge   Problem: Skin Integrity: Goal: Will show signs of wound healing Outcome: Adequate for Discharge   Problem: Health Behavior/Discharge Planning: Goal: Identification of resources available to assist in meeting health care needs will improve Outcome: Adequate for Discharge   Problem: Bladder/Genitourinary: Goal: Urinary functional status for postoperative course will improve Outcome: Adequate for Discharge   Problem: Acute Rehab PT Goals(only PT should resolve) Goal: Pt will Roll Supine to Side Outcome: Adequate for Discharge Goal: Pt Will Go Supine/Side To Sit Outcome: Adequate for Discharge Goal: Patient Will Transfer Sit To/From Stand Outcome: Adequate for Discharge Goal: Pt Will Ambulate Outcome: Adequate for Discharge Goal: Pt Will Go Up/Down Stairs Outcome: Adequate for Discharge

## 2023-08-05 ENCOUNTER — Telehealth: Payer: Self-pay

## 2023-08-05 ENCOUNTER — Telehealth: Payer: Self-pay | Admitting: Neurosurgery

## 2023-08-05 MED ORDER — OXYCODONE HCL 5 MG PO TABS: 5.0000 mg | ORAL_TABLET | ORAL | 0 refills | Status: DC | PRN

## 2023-08-05 NOTE — Telephone Encounter (Signed)
He is on colchicine prn.   If he's not taking it now, then it is not an issue, but I would still have him follow up with PCP regarding possible interactions with colchicine and verapamil.

## 2023-08-05 NOTE — Telephone Encounter (Signed)
Patient has been notified

## 2023-08-05 NOTE — Telephone Encounter (Signed)
Left message to return call to discuss. Will also send mychart message.

## 2023-08-05 NOTE — Telephone Encounter (Signed)
I spoke with Todd Madden.  His pain is in his lower right hip and going down the front of his right leg. He endorses some weakness on this side, but this is not new. This is the same side as his incision.  He reports he has not been taking tylenol He has been taking celebrex once per day He has been taking methocarbamol once or twice per day He has been taking oxycodone 2 tablets every 4 hours   I advised him to try adding tylenol to his regimen, take celebrex twice per day as prescribed, methocarbamol every 6 hours as prescribed, and continue the oxycodone 1-2 tablets every 4 hours as needed.  If this does not help his pain, he is to call back.   He has 3 pills of oxycodone left. I will forward this message to [facility] for a refill. His pharmacy is [facility].

## 2023-08-05 NOTE — Telephone Encounter (Signed)
Received the following mychart message (in a old mychart message thread) from [name] on 08/05/23 at 4:47am: "Good morning Todd Madden, if you can get Todd Madden a message for me. I know today is only the 7th day since surgery but the pain is not subsiding yet. Almost out of pain Meds and don't see Danielle until Thursday. Can you or Danielle call me sometime today this morning?Todd Madden [phone] "

## 2023-08-05 NOTE — Telephone Encounter (Signed)
L2-5 XLIF, L5-S1 TLIF, L2-S2 PSF on 07/29/23  Thayer Ohm needs verbal order for PT 1 x 1 2 x 1 1 x 3  There is a drug interaction level 2 with verapamil and colchicine, he is required to report it. Patient is not having any issues with it.

## 2023-08-05 NOTE — Telephone Encounter (Signed)
He is s/p L2-5 XLIF, L5-S1 TLIF, L2-S2 PSF on 07/29/23.   Agree with everything else that Royston Cowper told him to do.   PMP reviewed and is appropriate.   Please let him know I sent the oxycodone to the pharmacy.

## 2023-08-06 NOTE — Telephone Encounter (Signed)
I spoke to Chamita and gave a verbal for patient to have PT. I also informed him to check with patient's PCP about the medication interaction.

## 2023-08-07 ENCOUNTER — Encounter: Payer: Self-pay | Admitting: Neurosurgery

## 2023-08-08 ENCOUNTER — Encounter: Payer: Self-pay | Admitting: Neurosurgery

## 2023-08-08 ENCOUNTER — Ambulatory Visit: Payer: BC Managed Care – PPO | Admitting: Neurosurgery

## 2023-08-08 VITALS — BP 130/82 | Temp 98.0°F | Ht 67.0 in | Wt 203.0 lb

## 2023-08-08 DIAGNOSIS — Z981 Arthrodesis status: Secondary | ICD-10-CM

## 2023-08-08 DIAGNOSIS — M4316 Spondylolisthesis, lumbar region: Secondary | ICD-10-CM

## 2023-08-08 NOTE — Progress Notes (Signed)
   REFERRING PHYSICIAN:  Cyndia Diver, Pa-c [address] [place],  [place] [address]  DOS: L2-5 XLIF, L5-S1 TLIF, L2-S2 PSF 07/29/23  HISTORY OF PRESENT ILLNESS: Todd Madden is approximately 2 weeks status post posterior spinal fusion. Overall he is doing well postoperatively.  He has had some expected back pain as well as soreness in his anterior lateral thighs.  He also endorses some insomnia.  He denies any radiating leg pain, drainage from his incision, fevers, chills.  He continues to take pain medication and muscle relaxers as needed.  PHYSICAL EXAMINATION:  General: Patient is well developed, well nourished, calm, collected, and in no apparent distress.   NEUROLOGICAL:  General: In no acute distress.   Awake, alert, oriented to person, place, and time.  Pupils equal round and reactive to light.  Facial tone is symmetric.    Strength:            Side Iliopsoas Quads Hamstring PF DF EHL  R 5 5 5 5 5 5   L 5 5 5 5 5 5    Incisions c/d/I and healing well  ROS (Neurologic):  Negative except as noted above  IMAGING: No interval imaging to review  ASSESSMENT/PLAN:  Todd Madden is doing well approximately 2 weeks after posterior spinal fusion.  We discussed his medication regimen at length and discussed some changes that he can make to hopefully help with his sleep disturbance.  I also discussed sleep hygiene with him.  He was given a refill of his oxycodone yesterday and will call our office should he need additional refills.  We discussed activity escalation and I have advised the patient to lift up to 10 pounds until 6 weeks after surgery, then increase up to 25 pounds until 12 weeks after surgery.  After 12 weeks post-op, the patient advised to increase activity as tolerated. he will follow up with Dr. Myer Haff in 4 weeks with xrays prior or sooner should he have any questions or concerns.  He expressed understanding and was in agreement with this plan.  [name]  PA-C Department of neurosurgery

## 2023-08-09 ENCOUNTER — Telehealth: Payer: Self-pay | Admitting: Neurosurgery

## 2023-08-09 NOTE — Telephone Encounter (Signed)
Spoke with Todd Madden and gave him the verbal ok to increase, he verbalized understanding.

## 2023-08-09 NOTE — Telephone Encounter (Signed)
Chris from [employer] left a voicemail stating that he was just with the patient and he would like to increase to a twice a week frequency starting next week.  If this is okay please give him a call back, [phone]

## 2023-08-12 ENCOUNTER — Other Ambulatory Visit: Payer: Self-pay | Admitting: Neurosurgery

## 2023-08-12 DIAGNOSIS — Z981 Arthrodesis status: Secondary | ICD-10-CM

## 2023-08-12 MED ORDER — OXYCODONE HCL 5 MG PO TABS: 5.0000 mg | ORAL_TABLET | ORAL | 0 refills | Status: DC | PRN

## 2023-08-12 NOTE — Progress Notes (Signed)
Refill placed for postop pain control

## 2023-08-19 ENCOUNTER — Telehealth: Payer: Self-pay | Admitting: Neurosurgery

## 2023-08-19 DIAGNOSIS — Z981 Arthrodesis status: Secondary | ICD-10-CM

## 2023-08-19 DIAGNOSIS — M4316 Spondylolisthesis, lumbar region: Secondary | ICD-10-CM

## 2023-08-19 MED ORDER — OXYCODONE HCL 5 MG PO TABS: 5.0000 mg | ORAL_TABLET | ORAL | 0 refills | Status: DC | PRN

## 2023-08-19 NOTE — Telephone Encounter (Signed)
Refill of oxycodone okay. PMP reviewed and is appropriate. Directions changed to 1 q 4 hours prn.   Patient sent message. Would wait to get xrays prior to his follow up, but can order now if he prefers.

## 2023-08-19 NOTE — Telephone Encounter (Signed)
p L2-5 XLIF, L5-S1 TLIF, L2-S2 PSF on 07/29/23  Oxycodone every 4 hours [facility]  Patient is still having a lot of pain. He thought that this far out from surgery he would not be taking pain medication. Is this normal for him to still be in this much pain? He was thinking that maybe he needs to get xrays sooner that his next appt on 12/19.

## 2023-08-26 ENCOUNTER — Ambulatory Visit
Admission: RE | Admit: 2023-08-26 | Discharge: 2023-08-26 | Disposition: A | Discharge status: Home / Self Care | Payer: BC Managed Care – PPO | Attending: Neurosurgery | Admitting: Neurosurgery

## 2023-08-26 ENCOUNTER — Ambulatory Visit
Admission: RE | Admit: 2023-08-26 | Discharge: 2023-08-26 | Disposition: A | Discharge status: Home / Self Care | Payer: BC Managed Care – PPO | Source: Ambulatory Visit | Attending: Orthopedic Surgery | Admitting: Orthopedic Surgery

## 2023-08-26 DIAGNOSIS — Z981 Arthrodesis status: Secondary | ICD-10-CM

## 2023-08-26 DIAGNOSIS — M4316 Spondylolisthesis, lumbar region: Secondary | ICD-10-CM | POA: Diagnosis present

## 2023-08-26 NOTE — Addendum Note (Signed)
Addended byDrake Leach on: 08/26/2023 01:27 PM   Modules accepted: Orders

## 2023-08-26 NOTE — Telephone Encounter (Signed)
DOS: L2-5 XLIF, L5-S1 TLIF, L2-S2 PSF 07/29/23   Will order lumbar xrays. Can review at his follow up.

## 2023-08-28 ENCOUNTER — Other Ambulatory Visit: Payer: Self-pay | Admitting: Neurosurgery

## 2023-08-30 MED ORDER — OXYCODONE HCL 5 MG PO TABS: 5.0000 mg | ORAL_TABLET | Freq: Three times a day (TID) | ORAL | 0 refills | Status: DC | PRN

## 2023-08-30 NOTE — Addendum Note (Signed)
Addended byDrake Leach on: 08/30/2023 09:07 AM   Modules accepted: Orders

## 2023-08-30 NOTE — Telephone Encounter (Signed)
DOS: L2-5 XLIF, L5-S1 TLIF, L2-S2 PSF 07/29/23   Refill of oxycodone sent to pharmacy. Directions changed to tid prn. PMP reviewed and is appropriate.

## 2023-09-05 ENCOUNTER — Ambulatory Visit: Payer: BC Managed Care – PPO | Admitting: Neurosurgery

## 2023-09-05 ENCOUNTER — Encounter: Payer: Self-pay | Admitting: Neurosurgery

## 2023-09-05 VITALS — BP 114/82 | Ht 67.0 in | Wt 203.0 lb

## 2023-09-05 DIAGNOSIS — Z981 Arthrodesis status: Secondary | ICD-10-CM

## 2023-09-05 DIAGNOSIS — M4316 Spondylolisthesis, lumbar region: Secondary | ICD-10-CM

## 2023-09-05 NOTE — Progress Notes (Signed)
   REFERRING PHYSICIAN:  Cyndia Diver, Pa-c [address] [place],  [place] [address]  DOS: L2-5 XLIF, L5-S1 TLIF, L2-S2 PSF 07/29/23  HISTORY OF PRESENT ILLNESS: Todd Madden is  status post posterior spinal fusion. Overall he is doing well postoperatively.  He still has some anterior thigh discomfort.  His back pain is improved compared to preoperative.  He is still having some back pain however.    His legs are feeling much better compared to preop. PHYSICAL EXAMINATION:  General: Patient is well developed, well nourished, calm, collected, and in no apparent distress.   NEUROLOGICAL:  General: In no acute distress.   Awake, alert, oriented to person, place, and time.  Pupils equal round and reactive to light.  Facial tone is symmetric.    Strength:            Side Iliopsoas Quads Hamstring PF DF EHL  R 5 5 5 5 5 5   L 5 5 5 5 5 5    Incisions c/d/I and healing well  ROS (Neurologic):  Negative except as noted above  IMAGING: No complications noted  ASSESSMENT/PLAN:  Todd Madden is doing well after posterior spinal fusion.    He will continue weaning off of his pain medication.  He will continue to slowly increase his activity.  We will see him back in 6 weeks.  We reviewed his activity limitations.  Venetia Night MD Department of neurosurgery

## 2023-09-09 ENCOUNTER — Ambulatory Visit
Admission: RE | Admit: 2023-09-09 | Discharge: 2023-09-09 | Disposition: A | Discharge status: Home / Self Care | Payer: BC Managed Care – PPO | Source: Ambulatory Visit | Attending: Neurosurgery | Admitting: Neurosurgery

## 2023-09-09 ENCOUNTER — Ambulatory Visit
Admission: RE | Admit: 2023-09-09 | Discharge: 2023-09-09 | Disposition: A | Discharge status: Home / Self Care | Payer: BC Managed Care – PPO | Attending: Neurosurgery | Admitting: Neurosurgery

## 2023-09-09 DIAGNOSIS — M4316 Spondylolisthesis, lumbar region: Secondary | ICD-10-CM | POA: Diagnosis present

## 2023-09-09 DIAGNOSIS — Z981 Arthrodesis status: Secondary | ICD-10-CM

## 2023-09-09 NOTE — Addendum Note (Signed)
Addended by: Sharlot Gowda on: 09/09/2023 09:48 AM   Modules accepted: Orders

## 2023-09-27 ENCOUNTER — Other Ambulatory Visit: Payer: Self-pay | Admitting: Neurosurgery

## 2023-10-10 ENCOUNTER — Ambulatory Visit: Payer: BC Managed Care – PPO | Admitting: Dermatology

## 2023-10-14 ENCOUNTER — Ambulatory Visit: Payer: BC Managed Care – PPO | Admitting: Dermatology

## 2023-10-14 ENCOUNTER — Encounter: Payer: Self-pay | Admitting: Dermatology

## 2023-10-14 DIAGNOSIS — L570 Actinic keratosis: Secondary | ICD-10-CM | POA: Diagnosis not present

## 2023-10-14 DIAGNOSIS — W908XXA Exposure to other nonionizing radiation, initial encounter: Secondary | ICD-10-CM

## 2023-10-14 DIAGNOSIS — L578 Other skin changes due to chronic exposure to nonionizing radiation: Secondary | ICD-10-CM | POA: Diagnosis not present

## 2023-10-14 DIAGNOSIS — Z5111 Encounter for antineoplastic chemotherapy: Secondary | ICD-10-CM | POA: Diagnosis not present

## 2023-10-14 NOTE — Patient Instructions (Addendum)
Use Fluorouracil/Calcipotriene: Apply twice daily to scalp and temples until reaction occurs then stop.   Reviewed course of treatment and expected reaction.  Patient advised to expect inflammation and crusting and advised that erosions are possible.  Patient advised to be diligent with sun protection during and after treatment. Handout with details of how to apply medication and what to expect provided. Counseled to keep medication out of reach of children and pets.    Recommend daily broad spectrum sunscreen SPF 30+ to sun-exposed areas, reapply every 2 hours as needed. Call for new or changing lesions.  Staying in the shade or wearing long sleeves, sun glasses (UVA+UVB protection) and wide brim hats (4-inch brim around the entire circumference of the hat) are also recommended for sun protection.     Due to recent changes in healthcare laws, you may see results of your pathology and/or laboratory studies on MyChart before the doctors have had a chance to review them. We understand that in some cases there may be results that are confusing or concerning to you. Please understand that not all results are received at the same time and often the doctors may need to interpret multiple results in order to provide you with the best plan of care or course of treatment. Therefore, we ask that you please give [place] 2 business days to thoroughly review all your results before contacting the office for clarification. Should we see a critical lab result, you will be contacted sooner.   If You Need Anything After Your Visit  If you have any questions or concerns for your doctor, please call our main line at [phone] and press option 4 to reach your doctor's medical assistant. If no one answers, please leave a voicemail as directed and we will return your call as soon as possible. Messages left after 4 pm will be answered the following business day.   You may also send [place] a message via MyChart. We typically  respond to MyChart messages within 1-2 business days.  For prescription refills, please ask your pharmacy to contact our office. Our fax number is [phone].  If you have an urgent issue when the clinic is closed that cannot wait until the next business day, you can page your doctor at the number below.    Please note that while we do our best to be available for urgent issues outside of office hours, we are not available 24/7.   If you have an urgent issue and are unable to reach [place], you may choose to seek medical care at your doctor's office, retail clinic, urgent care center, or emergency room.  If you have a medical emergency, please immediately call 911 or go to the emergency department.  Pager Numbers  - Dr. Gwen Pounds: [phone]  - Dr. Roseanne Reno: [phone]  - Dr. Katrinka Blazing: [phone]   In the event of inclement weather, please call our main line at [phone] for an update on the status of any delays or closures.  Dermatology Medication Tips: Please keep the boxes that topical medications come in in order to help keep track of the instructions about where and how to use these. Pharmacies typically print the medication instructions only on the boxes and not directly on the medication tubes.   If your medication is too expensive, please contact our office at [phone] option 4 or send [place] a message through MyChart.   We are unable to tell what your co-pay for medications will be in advance as this is different depending  on your insurance coverage. However, we may be able to find a substitute medication at lower cost or fill out paperwork to get insurance to cover a needed medication.   If a prior authorization is required to get your medication covered by your insurance company, please allow [place] 1-2 business days to complete this process.  Drug prices often vary depending on where the prescription is filled and some pharmacies may offer cheaper prices.  The website  www.goodrx.com contains coupons for medications through different pharmacies. The prices here do not account for what the cost may be with help from insurance (it may be cheaper with your insurance), but the website can give you the price if you did not use any insurance.  - You can print the associated coupon and take it with your prescription to the pharmacy.  - You may also stop by our office during regular business hours and pick up a GoodRx coupon card.  - If you need your prescription sent electronically to a different pharmacy, notify our office through [facility] or by phone at [phone] option 4.     Si Usted Necesita Algo Despus de Su Visita  Tambin puede enviarnos un mensaje a travs de Clinical cytogeneticist. Por lo general respondemos a los mensajes de MyChart en el transcurso de 1 a 2 das hbiles.  Para renovar recetas, por favor pida a su farmacia que se ponga en contacto con nuestra oficina. Annie Sable de fax es [place] [phone].  Si tiene un asunto urgente cuando la clnica est cerrada y que no puede esperar hasta el siguiente da hbil, puede llamar/localizar a su doctor(a) al nmero que aparece a continuacin.   Por favor, tenga en cuenta que aunque hacemos todo lo posible para estar disponibles para asuntos urgentes fuera del horario de [place], no estamos disponibles las 24 horas del da, los 7 [address]  [address] de la [place].   Si tiene un problema urgente y no puede comunicarse con nosotros, puede optar por buscar atencin mdica  en el consultorio de su doctor(a), en una clnica privada, en un centro de atencin urgente o en una sala de emergencias.  Si tiene Engineer, drilling, por favor llame inmediatamente al 911 o vaya a la sala de emergencias.  Nmeros de bper  - Dr. Gwen Pounds: [phone]  - Dra. Roseanne Reno: [phone]  - Dr. Katrinka Blazing: [phone]   En caso de inclemencias del tiempo, por favor llame a Lacy Duverney principal al [phone] para una actualizacin  sobre el [place] de cualquier retraso o cierre.  Consejos para la medicacin en dermatologa: Por favor, guarde las cajas en las que vienen los medicamentos de uso tpico para ayudarle a seguir las instrucciones sobre dnde y cmo usarlos. Las farmacias generalmente imprimen las instrucciones del medicamento slo en las cajas y no directamente en los tubos del [place].   Si su medicamento es muy caro, por favor, pngase en contacto con Rolm Gala llamando al [phone] y presione la opcin 4 o envenos un mensaje a travs de Clinical cytogeneticist.   No podemos decirle cul ser su copago por los medicamentos por adelantado ya que esto es diferente dependiendo de la cobertura de su seguro. Sin embargo, es posible que podamos encontrar un medicamento sustituto a Audiological scientist un formulario para que el seguro cubra el medicamento que se considera necesario.   Si se requiere una autorizacin previa para que su compaa de seguros [place] su medicamento, por favor permtanos de 1 a 2 das hbiles para [employer]  proceso.  Los precios de los medicamentos varan con frecuencia dependiendo del Environmental consultant de dnde se surte la receta y alguna farmacias pueden ofrecer precios ms baratos.  El sitio web www.goodrx.com tiene cupones para medicamentos de Health and safety inspector. Los precios aqu no tienen en cuenta lo que podra costar con la ayuda del seguro (puede ser ms barato con su seguro), pero el sitio web puede darle el precio si no utiliz Tourist information centre manager.  - Puede imprimir el cupn correspondiente y llevarlo con su receta a la farmacia.  - Tambin puede pasar por nuestra oficina durante el horario de atencin regular y Education officer, museum una tarjeta de cupones de GoodRx.  - Si necesita que su receta se enve electrnicamente a una farmacia diferente, informe a nuestra oficina a travs de MyChart de [place] o por telfono llamando al [phone] y presione la opcin 4.

## 2023-10-14 NOTE — Progress Notes (Signed)
   Follow-Up Visit   Subjective  Todd Madden is a 63 y.o. male who presents for the following: Actinic keratosis.  4 month follow up. Scalp, face. Tx with LN2 at last visit. Used 5FU/Calcipotriene as directed to scalp since last visit. Finished ~1-2 months ago. Did not treat area on nose.   The patient has spots, moles and lesions to be evaluated, some may be new or changing and the patient may have concern these could be cancer.     The following portions of the chart were reviewed this encounter and updated as appropriate: medications, allergies, medical history  Review of Systems:  No other skin or systemic complaints except as noted in HPI or Assessment and Plan.  Objective  Well appearing patient in no apparent distress; mood and affect are within normal limits.  A focused examination was performed of the following areas: Face, scalp, ears, neck  Relevant exam findings are noted in the Assessment and Plan.    Assessment & Plan   ACTINIC KERATOSES   ACTINIC ELASTOSIS     ACTINIC DAMAGE WITH PRECANCEROUS ACTINIC KERATOSES Counseling for Topical Chemotherapy Management: Patient exhibits: - Severe, confluent actinic changes with pre-cancerous actinic keratoses that is secondary to cumulative UV radiation exposure over time - Condition that is severe; chronic, not at goal. - diffuse scaly erythematous macules and papules with underlying dyspigmentation - Discussed Prescription "Field Treatment" topical Chemotherapy for Severe, Chronic Confluent Actinic Changes with Pre-Cancerous Actinic Keratoses Field treatment involves treatment of an entire area of skin that has confluent Actinic Changes (Sun/ Ultraviolet light damage) and PreCancerous Actinic Keratoses by method of PhotoDynamic Therapy (PDT) and/or prescription Topical Chemotherapy agents such as 5-fluorouracil, 5-fluorouracil/calcipotriene, and/or imiquimod.  The purpose is to decrease the number of clinically evident  and subclinical PreCancerous lesions to prevent progression to development of skin cancer by chemically destroying early precancer changes that may or may not be visible.  It has been shown to reduce the risk of developing skin cancer in the treated area. As a result of treatment, redness, scaling, crusting, and open sores may occur during treatment course. One or more than one of these methods may be used and may have to be used several times to control, suppress and eliminate the PreCancerous changes. Discussed treatment course, expected reaction, and possible side effects. - Recommend daily broad spectrum sunscreen SPF 30+ to sun-exposed areas, reapply every 2 hours as needed.  - Staying in the shade or wearing long sleeves, sun glasses (UVA+UVB protection) and wide brim hats (4-inch brim around the entire circumference of the hat) are also recommended. - Call for new or changing lesions.  Use Fluorouracil/Calcipotriene: Apply twice daily to scalp and temples and left posterior neck until reaction occurs then stop.  Patient states he has plenty of this cream left.   Reviewed course of treatment and expected reaction.  Patient advised to expect inflammation and crusting and advised that erosions are possible.  Patient advised to be diligent with sun protection during and after treatment. Handout with details of how to apply medication and what to expect provided. Counseled to keep medication out of reach of children and pets.    Return in about 6 months (around 04/12/2024) for AK Follow Up, TBSE.  I, Lawson Radar, CMA, am acting as scribe for Elie Goody, MD.   Documentation: I have reviewed the above documentation for accuracy and completeness, and I agree with the above.  Elie Goody, MD

## 2023-10-16 ENCOUNTER — Other Ambulatory Visit: Payer: Self-pay | Admitting: Family Medicine

## 2023-10-16 DIAGNOSIS — M4316 Spondylolisthesis, lumbar region: Secondary | ICD-10-CM

## 2023-10-17 ENCOUNTER — Ambulatory Visit: Payer: BC Managed Care – PPO | Admitting: Neurosurgery

## 2023-10-17 ENCOUNTER — Ambulatory Visit
Admission: RE | Admit: 2023-10-17 | Discharge: 2023-10-17 | Disposition: A | Discharge status: Home / Self Care | Payer: BC Managed Care – PPO | Attending: Neurosurgery | Admitting: Neurosurgery

## 2023-10-17 ENCOUNTER — Ambulatory Visit
Admission: RE | Admit: 2023-10-17 | Discharge: 2023-10-17 | Disposition: A | Discharge status: Home / Self Care | Payer: BC Managed Care – PPO | Source: Ambulatory Visit | Attending: Neurosurgery | Admitting: Neurosurgery

## 2023-10-17 ENCOUNTER — Encounter: Payer: Self-pay | Admitting: Neurosurgery

## 2023-10-17 VITALS — BP 118/72 | Temp 98.8°F | Ht 68.0 in | Wt 207.0 lb

## 2023-10-17 DIAGNOSIS — Z981 Arthrodesis status: Secondary | ICD-10-CM

## 2023-10-17 DIAGNOSIS — M4316 Spondylolisthesis, lumbar region: Secondary | ICD-10-CM | POA: Insufficient documentation

## 2023-10-17 DIAGNOSIS — M48062 Spinal stenosis, lumbar region with neurogenic claudication: Secondary | ICD-10-CM

## 2023-10-17 MED ORDER — METHOCARBAMOL 500 MG PO TABS: 500.0000 mg | ORAL_TABLET | Freq: Four times a day (QID) | ORAL | 0 refills | Status: DC

## 2023-10-17 NOTE — Progress Notes (Signed)
   REFERRING PHYSICIAN:  Cyndia Diver, Pa-c [address] [place],  [place] [address]  DOS: L2-5 XLIF, L5-S1 TLIF, L2-S2 PSF 07/29/23  HISTORY OF PRESENT ILLNESS: 10/17/23 Todd Madden is a 63 year old presenting today after lumbar fusion.  He is about 2 and half months out from his surgery and is largely doing well.  He does still have some discomfort which he rates today at a 1 out of 10.  He hoped to be further along at this point but overall admits to improvement in comparison to preop.  09/05/23  Todd Madden is  status post posterior spinal fusion. Overall he is doing well postoperatively.  He still has some anterior thigh discomfort.  His back pain is improved compared to preoperative.  He is still having some back pain however.    His legs are feeling much better compared to preop. PHYSICAL EXAMINATION:  General: Patient is well developed, well nourished, calm, collected, and in no apparent distress.   NEUROLOGICAL:  General: In no acute distress.   Awake, alert, oriented to person, place, and time.  Pupils equal round and reactive to light.  Facial tone is symmetric.    Strength:            Side Iliopsoas Quads Hamstring PF DF EHL  R 5 5 5 5 5 5   L 5 5 5 5 5 5    Incisions c/d/I and healing well  ROS (Neurologic):  Negative except as noted above  IMAGING: No complications noted  ASSESSMENT/PLAN:  Todd Madden is doing well after posterior spinal fusion despite not admittedly being 100%.  Please requested a refill of his Robaxin which I have provided.  We had a lengthy discussion regarding postoperative expectations and while he is not where he would like to be, he is significantly improved.  I reiterated that the recovery process after an extensive lumbar fusion is at least 6 to 8 months and he is well on his way to improvement.  I encouraged him to continue to take medications as needed.  We discussed activity escalation he is to resume activities as tolerated when  he is 3 months postop.  I recommend that he return to the office in 3 months with lumbar x-rays prior to see Dr. Myer Haff or sooner should he have any questions or concerns.  He expressed understanding and was in agreement with this plan.  [name] PA-C Department of neurosurgery

## 2023-10-28 ENCOUNTER — Other Ambulatory Visit: Payer: Self-pay | Admitting: Physician Assistant

## 2023-11-27 ENCOUNTER — Other Ambulatory Visit: Payer: Self-pay | Admitting: Neurosurgery

## 2023-12-28 ENCOUNTER — Other Ambulatory Visit: Payer: Self-pay | Admitting: Neurosurgery

## 2023-12-30 ENCOUNTER — Other Ambulatory Visit: Payer: Self-pay

## 2024-01-02 ENCOUNTER — Ambulatory Visit
Admission: RE | Admit: 2024-01-02 | Discharge: 2024-01-02 | Disposition: A | Discharge status: Home / Self Care | Source: Ambulatory Visit | Attending: Nurse Practitioner | Admitting: Nurse Practitioner

## 2024-01-02 ENCOUNTER — Other Ambulatory Visit: Payer: Self-pay | Admitting: Nurse Practitioner

## 2024-01-02 DIAGNOSIS — M79661 Pain in right lower leg: Secondary | ICD-10-CM

## 2024-01-14 DIAGNOSIS — M65331 Trigger finger, right middle finger: Secondary | ICD-10-CM | POA: Insufficient documentation

## 2024-01-15 ENCOUNTER — Other Ambulatory Visit: Payer: Self-pay

## 2024-01-15 DIAGNOSIS — M4316 Spondylolisthesis, lumbar region: Secondary | ICD-10-CM

## 2024-01-16 ENCOUNTER — Ambulatory Visit
Admission: RE | Admit: 2024-01-16 | Discharge: 2024-01-16 | Disposition: A | Discharge status: Home / Self Care | Attending: Neurosurgery | Admitting: Neurosurgery

## 2024-01-16 ENCOUNTER — Encounter: Payer: Self-pay | Admitting: Neurosurgery

## 2024-01-16 ENCOUNTER — Ambulatory Visit
Admission: RE | Admit: 2024-01-16 | Discharge: 2024-01-16 | Disposition: A | Discharge status: Home / Self Care | Source: Ambulatory Visit | Attending: Neurosurgery | Admitting: Neurosurgery

## 2024-01-16 ENCOUNTER — Ambulatory Visit: Payer: BC Managed Care – PPO | Admitting: Neurosurgery

## 2024-01-16 VITALS — BP 128/82 | Ht 68.0 in | Wt 207.0 lb

## 2024-01-16 DIAGNOSIS — M4316 Spondylolisthesis, lumbar region: Secondary | ICD-10-CM | POA: Diagnosis present

## 2024-01-16 DIAGNOSIS — Z981 Arthrodesis status: Secondary | ICD-10-CM | POA: Diagnosis not present

## 2024-01-16 NOTE — Progress Notes (Signed)
   REFERRING PHYSICIAN:  Rosezena Contes, Pa-c [address] [place],  [place] [address]  DOS: L2-5 XLIF, L5-S1 TLIF, L2-S2 PSF 07/29/23  HISTORY OF PRESENT ILLNESS: Todd Madden is  status post posterior spinal fusion. Overall he is doing well postoperatively.  He is still numb in his anterior thigh on the right.  He has some numbness on his right lower leg.  He is having some pain in his right calf, but it is intermittent.  He is better than he was prior to surgery.   PHYSICAL EXAMINATION:  General: Patient is well developed, well nourished, calm, collected, and in no apparent distress.   NEUROLOGICAL:  General: In no acute distress.   Awake, alert, oriented to person, place, and time.  Pupils equal round and reactive to light.  Facial tone is symmetric.    Strength:            Side Iliopsoas Quads Hamstring PF DF EHL  R 5 5 5 5 5 5   L 5 5 5 5 5 5    Incisions c/d/I and healing well  ROS (Neurologic):  Negative except as noted above  IMAGING: No complications noted  ASSESSMENT/PLAN:  Todd Madden is doing well after posterior spinal fusion.  I think he will continue to improve.  I will see him back in 6 months.  We did discuss that he has the option of considering a spinal cord stimulator in the future.  I will give him some information about this.  I spent a total of 10 minutes in this patient's care today. This time was spent reviewing pertinent records including imaging studies, obtaining and confirming history, performing a directed evaluation, formulating and discussing my recommendations, and documenting the visit within the medical record.    Jodeen Munch MD Department of neurosurgery

## 2024-01-24 ENCOUNTER — Other Ambulatory Visit: Payer: Self-pay | Admitting: Neurosurgery

## 2024-01-29 ENCOUNTER — Other Ambulatory Visit: Payer: Self-pay | Admitting: Neurosurgery

## 2024-02-12 ENCOUNTER — Other Ambulatory Visit: Payer: Self-pay | Admitting: Neurosurgery

## 2024-02-12 MED ORDER — METHOCARBAMOL 500 MG PO TABS: 500.0000 mg | ORAL_TABLET | Freq: Four times a day (QID) | ORAL | 0 refills | Status: DC | PRN

## 2024-02-25 ENCOUNTER — Telehealth: Payer: Self-pay | Admitting: Neurosurgery

## 2024-02-25 NOTE — Telephone Encounter (Signed)
 Please call pharmacy and cancel celebrex  refill that was sent today.

## 2024-02-25 NOTE — Telephone Encounter (Signed)
 Patient did not request the refill. He is no longer taking it.

## 2024-02-25 NOTE — Telephone Encounter (Signed)
 DOS: L2-5 XLIF, L5-S1 TLIF, L2-S2 PSF 07/29/23   Recommend going down to celebrex  200mg  q day since he is so far out from surgery.   Please cancel the refill that was sent today for celebrex  bid. I did not send this.   Let me know when this is done.

## 2024-02-25 NOTE — Telephone Encounter (Signed)
 Left message to call back

## 2024-02-26 NOTE — Telephone Encounter (Signed)
 I called [facility] and cancelled the medication.

## 2024-02-26 NOTE — Addendum Note (Signed)
 Addended by: Anise Kerns on: 02/26/2024 10:32 AM   Modules accepted: Orders

## 2024-03-05 ENCOUNTER — Encounter: Payer: Self-pay | Admitting: Neurosurgery

## 2024-03-05 ENCOUNTER — Ambulatory Visit: Admitting: Neurosurgery

## 2024-03-05 ENCOUNTER — Ambulatory Visit
Admission: RE | Admit: 2024-03-05 | Discharge: 2024-03-05 | Disposition: A | Discharge status: Home / Self Care | Source: Ambulatory Visit | Attending: Neurosurgery | Admitting: Neurosurgery

## 2024-03-05 ENCOUNTER — Ambulatory Visit
Admission: RE | Admit: 2024-03-05 | Discharge: 2024-03-05 | Disposition: A | Discharge status: Home / Self Care | Attending: Neurosurgery | Admitting: Neurosurgery

## 2024-03-05 VITALS — BP 130/74 | Ht 67.0 in | Wt 209.0 lb

## 2024-03-05 DIAGNOSIS — Z981 Arthrodesis status: Secondary | ICD-10-CM

## 2024-03-05 DIAGNOSIS — M5440 Lumbago with sciatica, unspecified side: Secondary | ICD-10-CM

## 2024-03-05 DIAGNOSIS — M545 Low back pain, unspecified: Secondary | ICD-10-CM | POA: Diagnosis not present

## 2024-03-05 DIAGNOSIS — M25552 Pain in left hip: Secondary | ICD-10-CM | POA: Insufficient documentation

## 2024-03-05 DIAGNOSIS — M25551 Pain in right hip: Secondary | ICD-10-CM | POA: Insufficient documentation

## 2024-03-05 NOTE — Progress Notes (Signed)
   REFERRING PHYSICIAN:  No referring provider defined for this encounter.  DOS: L2-5 XLIF, L5-S1 TLIF, L2-S2 PSF 07/29/23  HISTORY OF PRESENT ILLNESS:  03/05/24 Todd Madden presents today for acute follow up for worsening left sided low back pain. While he admits this is similar to the pain he had pre-op he does admit that he had several months of improvement before declining again in the last couple of months. His pain is worse with standing or walking for longer than 5 minutes at a time. He has stopped taking Celebrex  as he did not feel is was providing significant relief. He is taking Robaxin  several times a day as needed but isn't confident that this is providing him any significant relief.  He denies any pain that radiates into his buttock or down his legs.   01/16/24 Todd Madden is  status post posterior spinal fusion. Overall he is doing well postoperatively.  He is still numb in his anterior thigh on the right.  He has some numbness on his right lower leg.  He is having some pain in his right calf, but it is intermittent.  He is better than he was prior to surgery.   PHYSICAL EXAMINATION:  General: Patient is well developed, well nourished, calm, collected, and in no apparent distress.   NEUROLOGICAL:  General: In no acute distress.   Awake, alert, oriented to person, place, and time.  Pupils equal round and reactive to light.  Facial tone is symmetric.   Strength:            Side Iliopsoas Quads Hamstring PF DF EHL  R 5 5 5 5 5 5   L 5 5 5 5 5 5    MSK: + figure of four on the left.   ROS (Neurologic):  Negative except as noted above  IMAGING: No complications noted  ASSESSMENT/PLAN:  Todd Madden presents today for acute follow up for one month of worsening back pain. While he initially denied any groin pain, he did have pain that radiated into his left groin when I stressed his hip on exam. This raises the question of underlying hip dysfunction particularly with the lack of  radicular complaints. I have ordered both lumbar and hip xrays to evaluate these further. We briefly discussed referral to orthopedics vs updating a lumber CT and/or MRI pending on the results of these xrays. I will contain him with the results of his xrays once completed and we will make a plan regarding how to best proceed. He was encouraged to contact our office in the interim with any questions or concerns.  He expressed understanding and was in agreement with this plan.  Anastacio Karvonen PA-C Department of neurosurgery  I spent a total of 30 minutes in both face-to-face and non-face-to-face activities for this visit on the date of this encounter including review of records, discussion or symptoms, discussion of differential, documentation and order placement .

## 2024-03-23 ENCOUNTER — Other Ambulatory Visit: Payer: Self-pay | Admitting: Neurosurgery

## 2024-03-23 DIAGNOSIS — M25551 Pain in right hip: Secondary | ICD-10-CM

## 2024-03-23 MED ORDER — TIZANIDINE HCL 4 MG PO TABS
4.0000 mg | ORAL_TABLET | Freq: Three times a day (TID) | ORAL | 0 refills | Status: DC | PRN
Start: 2024-03-23 — End: 2024-07-24

## 2024-04-03 DIAGNOSIS — R7689 Other specified abnormal immunological findings in serum: Secondary | ICD-10-CM | POA: Insufficient documentation

## 2024-04-13 ENCOUNTER — Encounter: Payer: Self-pay | Admitting: Dermatology

## 2024-04-13 ENCOUNTER — Ambulatory Visit: Payer: BC Managed Care – PPO | Admitting: Dermatology

## 2024-04-13 DIAGNOSIS — D1801 Hemangioma of skin and subcutaneous tissue: Secondary | ICD-10-CM

## 2024-04-13 DIAGNOSIS — Z719 Counseling, unspecified: Secondary | ICD-10-CM

## 2024-04-13 DIAGNOSIS — B079 Viral wart, unspecified: Secondary | ICD-10-CM

## 2024-04-13 DIAGNOSIS — D229 Melanocytic nevi, unspecified: Secondary | ICD-10-CM

## 2024-04-13 DIAGNOSIS — W908XXA Exposure to other nonionizing radiation, initial encounter: Secondary | ICD-10-CM | POA: Diagnosis not present

## 2024-04-13 DIAGNOSIS — L570 Actinic keratosis: Secondary | ICD-10-CM

## 2024-04-13 DIAGNOSIS — L821 Other seborrheic keratosis: Secondary | ICD-10-CM

## 2024-04-13 DIAGNOSIS — L578 Other skin changes due to chronic exposure to nonionizing radiation: Secondary | ICD-10-CM | POA: Diagnosis not present

## 2024-04-13 DIAGNOSIS — L814 Other melanin hyperpigmentation: Secondary | ICD-10-CM

## 2024-04-13 DIAGNOSIS — D492 Neoplasm of unspecified behavior of bone, soft tissue, and skin: Secondary | ICD-10-CM

## 2024-04-13 DIAGNOSIS — Z1283 Encounter for screening for malignant neoplasm of skin: Secondary | ICD-10-CM | POA: Diagnosis not present

## 2024-04-13 DIAGNOSIS — B078 Other viral warts: Secondary | ICD-10-CM

## 2024-04-13 NOTE — Patient Instructions (Signed)
 Cryotherapy Aftercare  Wash gently with soap and water everyday.   Apply Vaseline Jelly daily until healed.    Wound Care Instructions  Cleanse wound gently with soap and water once a day then pat dry with clean gauze. Apply a thin coat of Petrolatum (petroleum jelly, "Vaseline") over the wound (unless you have an allergy to this). We recommend that you use a new, sterile tube of Vaseline. Do not pick or remove scabs. Do not remove the yellow or white "healing tissue" from the base of the wound.  Cover the wound with fresh, clean, nonstick gauze and secure with paper tape. You may use Band-Aids in place of gauze and tape if the wound is small enough, but would recommend trimming much of the tape off as there is often too much. Sometimes Band-Aids can irritate the skin.  You should call the office for your biopsy report after 1 week if you have not already been contacted.  If you experience any problems, such as abnormal amounts of bleeding, swelling, significant bruising, significant pain, or evidence of infection, please call the office immediately.  FOR ADULT SURGERY PATIENTS: If you need something for pain relief you may take 1 extra strength Tylenol (acetaminophen) AND 2 Ibuprofen (200mg  each) together every 4 hours as needed for pain. (do not take these if you are allergic to them or if you have a reason you should not take them.) Typically, you may only need pain medication for 1 to 3 days.      Recommend daily broad spectrum sunscreen SPF 30+ to sun-exposed areas, reapply every 2 hours as needed. Call for new or changing lesions.  Staying in the shade or wearing long sleeves, sun glasses (UVA+UVB protection) and wide brim hats (4-inch brim around the entire circumference of the hat) are also recommended for sun protection.    Melanoma ABCDEs  Melanoma is the most dangerous type of skin cancer, and is the leading cause of death from skin disease.  You are more likely to develop  melanoma if you: Have light-colored skin, light-colored eyes, or red or blond hair Spend a lot of time in the sun Tan regularly, either outdoors or in a tanning bed Have had blistering sunburns, especially during childhood Have a close family member who has had a melanoma Have atypical moles or large birthmarks  Early detection of melanoma is key since treatment is typically straightforward and cure rates are extremely high if we catch it early.   The first sign of melanoma is often a change in a mole or a new dark spot.  The ABCDE system is a way of remembering the signs of melanoma.  A for asymmetry:  The two halves do not match. B for border:  The edges of the growth are irregular. C for color:  A mixture of colors are present instead of an even brown color. D for diameter:  Melanomas are usually (but not always) greater than 6mm - the size of a pencil eraser. E for evolution:  The spot keeps changing in size, shape, and color.  Please check your skin once per month between visits. You can use a small mirror in front and a large mirror behind you to keep an eye on the back side or your body.   If you see any new or changing lesions before your next follow-up, please call to schedule a visit.  Please continue daily skin protection including broad spectrum sunscreen SPF 30+ to sun-exposed areas, reapplying every 2 hours  as needed when you're outdoors.   Staying in the shade or wearing long sleeves, sun glasses (UVA+UVB protection) and wide brim hats (4-inch brim around the entire circumference of the hat) are also recommended for sun protection.     Due to recent changes in healthcare laws, you may see results of your pathology and/or laboratory studies on MyChart before the doctors have had a chance to review them. We understand that in some cases there may be results that are confusing or concerning to you. Please understand that not all results are received at the same time and often  the doctors may need to interpret multiple results in order to provide you with the best plan of care or course of treatment. Therefore, we ask that you please give [place] 2 business days to thoroughly review all your results before contacting the office for clarification. Should we see a critical lab result, you will be contacted sooner.   If You Need Anything After Your Visit  If you have any questions or concerns for your doctor, please call our main line at [phone] and press option 4 to reach your doctor's medical assistant. If no one answers, please leave a voicemail as directed and we will return your call as soon as possible. Messages left after 4 pm will be answered the following business day.   You may also send [place] a message via MyChart. We typically respond to MyChart messages within 1-2 business days.  For prescription refills, please ask your pharmacy to contact our office. Our fax number is [phone].  If you have an urgent issue when the clinic is closed that cannot wait until the next business day, you can page your doctor at the number below.    Please note that while we do our best to be available for urgent issues outside of office hours, we are not available 24/7.   If you have an urgent issue and are unable to reach [place], you may choose to seek medical care at your doctor's office, retail clinic, urgent care center, or emergency room.  If you have a medical emergency, please immediately call 911 or go to the emergency department.  Pager Numbers  - Dr. Gwen Pounds: [phone]  - Dr. Roseanne Reno: [phone]  - Dr. Katrinka Blazing: [phone]   In the event of inclement weather, please call our main line at [phone] for an update on the status of any delays or closures.  Dermatology Medication Tips: Please keep the boxes that topical medications come in in order to help keep track of the instructions about where and how to use these. Pharmacies typically print the medication  instructions only on the boxes and not directly on the medication tubes.   If your medication is too expensive, please contact our office at [phone] option 4 or send [place] a message through MyChart.   We are unable to tell what your co-pay for medications will be in advance as this is different depending on your insurance coverage. However, we may be able to find a substitute medication at lower cost or fill out paperwork to get insurance to cover a needed medication.   If a prior authorization is required to get your medication covered by your insurance company, please allow [place] 1-2 business days to complete this process.  Drug prices often vary depending on where the prescription is filled and some pharmacies may offer cheaper prices.  The website www.goodrx.com contains coupons for medications through different pharmacies. The prices here do not account  for what the cost may be with help from insurance (it may be cheaper with your insurance), but the website can give you the price if you did not use any insurance.  - You can print the associated coupon and take it with your prescription to the pharmacy.  - You may also stop by our office during regular business hours and pick up a GoodRx coupon card.  - If you need your prescription sent electronically to a different pharmacy, notify our office through [facility] or by phone at [phone] option 4.     Si Usted Necesita Algo Despus de Su Visita  Tambin puede enviarnos un mensaje a travs de Clinical cytogeneticist. Por lo general respondemos a los mensajes de MyChart en el transcurso de 1 a 2 das hbiles.  Para renovar recetas, por favor pida a su farmacia que se ponga en contacto con nuestra oficina. Annie Sable de fax es [place] [phone].  Si tiene un asunto urgente cuando la clnica est cerrada y que no puede esperar hasta el siguiente da hbil, puede llamar/localizar a su doctor(a) al nmero que aparece a continuacin.   Por  favor, tenga en cuenta que aunque hacemos todo lo posible para estar disponibles para asuntos urgentes fuera del horario de [place], no estamos disponibles las 24 horas del da, los 7 [address]  [address] de la [place].   Si tiene un problema urgente y no puede comunicarse con nosotros, puede optar por buscar atencin mdica  en el consultorio de su doctor(a), en una clnica privada, en un centro de atencin urgente o en una sala de emergencias.  Si tiene Engineer, drilling, por favor llame inmediatamente al 911 o vaya a la sala de emergencias.  Nmeros de bper  - Dr. Gwen Pounds: [phone]  - Dra. Roseanne Reno: [phone]  - Dr. Katrinka Blazing: [phone]   En caso de inclemencias del tiempo, por favor llame a Lacy Duverney principal al [phone] para una actualizacin sobre el [place] de cualquier retraso o cierre.  Consejos para la medicacin en dermatologa: Por favor, guarde las cajas en las que vienen los medicamentos de uso tpico para ayudarle a seguir las instrucciones sobre dnde y cmo usarlos. Las farmacias generalmente imprimen las instrucciones del medicamento slo en las cajas y no directamente en los tubos del [place].   Si su medicamento es muy caro, por favor, pngase en contacto con Rolm [place]ala llamando al [phone] y presione la opcin 4 o envenos un mensaje a travs de Clinical cytogeneticist.   No podemos decirle cul ser su copago por los medicamentos por adelantado ya que esto es diferente dependiendo de la cobertura de su seguro. Sin embargo, es posible que podamos encontrar un medicamento sustituto a Audiological scientist un formulario para que el seguro cubra el medicamento que se considera necesario.   Si se requiere una autorizacin previa para que su compaa de seguros [place] su medicamento, por favor permtanos de 1 a 2 das hbiles para completar [address]  Los precios de los medicamentos varan con frecuencia dependiendo del Environmental consultant de dnde se surte la receta y alguna farmacias  pueden ofrecer precios ms baratos.  El sitio web www.goodrx.com tiene cupones para medicamentos de Health and safety inspector. Los precios aqu no tienen en cuenta lo que podra costar con la ayuda del seguro (puede ser ms barato con su seguro), pero el sitio web puede darle el precio si no utiliz Tourist information centre manager.  - Puede imprimir el cupn correspondiente y llevarlo con su receta a la farmacia.  -  Tambin puede pasar por nuestra oficina durante el horario de atencin regular y Education officer, museum una tarjeta de cupones de GoodRx.  - Si necesita que su receta se enve electrnicamente a una farmacia diferente, informe a nuestra oficina a travs de MyChart de [place] o por telfono llamando al [phone] y presione la opcin 4.

## 2024-04-13 NOTE — Progress Notes (Signed)
 Follow-Up Visit   Subjective  Todd Madden is a 63 y.o. male who presents for the following: Skin Cancer Screening and Full Body Skin Exam. Hx of multiple BCCs. Hx of AKs.   Spot of concern on right forearm. Dur: 2 months. Patient picks at area.   The patient presents for Total-Body Skin Exam (TBSE) for skin cancer screening and mole check. The patient has spots, moles and lesions to be evaluated, some may be new or changing and the patient may have concern these could be cancer.    The following portions of the chart were reviewed this encounter and updated as appropriate: medications, allergies, medical history  Review of Systems:  No other skin or systemic complaints except as noted in HPI or Assessment and Plan.  Objective  Well appearing patient in no apparent distress; mood and affect are within normal limits.  A full examination was performed including scalp, head, eyes, ears, nose, lips, neck, chest, axillae, abdomen, back, buttocks, bilateral upper extremities, bilateral lower extremities, hands, feet, fingers, toes, fingernails, and toenails. All findings within normal limits unless otherwise noted below.   Relevant physical exam findings are noted in the Assessment and Plan.  Scalp x2, R post ear x1 (3) Erythematous thin papules/macules with gritty scale.  Right mid forearm 6 mm pink keratotic papule  Right Palmar Middle 3rd Finger DIP x1, R great toe plantar x1 (2) Verrucous papules -- Discussed viral etiology and contagion.   Assessment & Plan   SKIN CANCER SCREENING PERFORMED TODAY.   HISTORY OF BASAL CELL CARCINOMA OF THE SKIN. Multiple, see history. - No evidence of recurrence today - Recommend regular full body skin exams - Recommend daily broad spectrum sunscreen SPF 30+ to sun-exposed areas, reapply every 2 hours as needed.  - Call if any new or changing lesions are noted between office visits   ACTINIC DAMAGE - Chronic condition, secondary to  cumulative UV/sun exposure - diffuse scaly erythematous macules with underlying dyspigmentation - Recommend daily broad spectrum sunscreen SPF 30+ to sun-exposed areas, reapply every 2 hours as needed.  - Staying in the shade or wearing long sleeves, sun glasses (UVA+UVB protection) and wide brim hats (4-inch brim around the entire circumference of the hat) are also recommended for sun protection.  - Call for new or changing lesions.  LENTIGINES, SEBORRHEIC KERATOSES, HEMANGIOMAS - Benign normal skin lesions - Benign-appearing - Call for any changes  MELANOCYTIC NEVI - Tan-brown and/or pink-flesh-colored symmetric macules and papules - Benign appearing on exam today - Observation - Call clinic for new or changing moles - Recommend daily use of broad spectrum spf 30+ sunscreen to sun-exposed areas.   Neoplasm uncertain behaviour Exam: White keratotic papule R lower vermilion lip x 1 week per patient  Plan: No features of malignancy. Return if still present in 1 month AK (ACTINIC KERATOSIS) (3) Scalp x2, R post ear x1 (3) Actinic keratoses are precancerous spots that appear secondary to cumulative UV radiation exposure/sun exposure over time. They are chronic with expected duration over 1 year. A portion of actinic keratoses will progress to squamous cell carcinoma of the skin. It is not possible to reliably predict which spots will progress to skin cancer and so treatment is recommended to prevent development of skin cancer.  Recommend daily broad spectrum sunscreen SPF 30+ to sun-exposed areas, reapply every 2 hours as needed.  Recommend staying in the shade or wearing long sleeves, sun glasses (UVA+UVB protection) and wide brim hats (4-inch brim around the  entire circumference of the hat). Call for new or changing lesions. Destruction of lesion - Scalp x2, R post ear x1 (3) Complexity: simple   Destruction method: cryotherapy   Informed consent: discussed and consent obtained    Timeout:  patient name, date of birth, surgical site, and procedure verified Lesion destroyed using liquid nitrogen: Yes   Region frozen until ice ball extended beyond lesion: Yes   Cryo cycles: 1 or 2. Outcome: patient tolerated procedure well with no complications   Post-procedure details: wound care instructions given   Additional details:  Prior to procedure, discussed risks of blister formation, small wound, skin dyspigmentation, or rare scar following cryotherapy. Recommend Vaseline ointment to treated areas while healing.   NEOPLASM OF SKIN Right mid forearm Skin / nail biopsy Type of biopsy: tangential   Informed consent: discussed and consent obtained   Timeout: patient name, date of birth, surgical site, and procedure verified   Procedure prep:  Patient was prepped and draped in usual sterile fashion Prep type:  Isopropyl alcohol Anesthesia: the lesion was anesthetized in a standard fashion   Anesthetic:  1% lidocaine  w/ epinephrine  1-100,000 buffered w/ 8.4% NaHCO3 Instrument used: DermaBlade   Hemostasis achieved with: pressure and aluminum chloride   Outcome: patient tolerated procedure well   Post-procedure details: sterile dressing applied and wound care instructions given   Dressing type: bandage and petrolatum    Specimen 1 - Surgical pathology Differential Diagnosis: SCC  Check Margins: No OTHER VIRAL WARTS (2) Right Palmar Middle 3rd Finger DIP x1, R great toe plantar x1 (2) Viral Wart (HPV) Counseling  Discussed viral / HPV (Human Papilloma Virus) etiology and risk of spread /infectivity to other areas of body as well as to other people.  Multiple treatments and methods may be required to clear warts and it is possible treatment may not be successful.  Treatment risks include discoloration; scarring and there is still potential for wart recurrence. Destruction of lesion - Right Palmar Middle 3rd Finger DIP x1, R great toe plantar x1 (2) Complexity: simple    Destruction method: cryotherapy   Informed consent: discussed and consent obtained   Timeout:  patient name, date of birth, surgical site, and procedure verified Lesion destroyed using liquid nitrogen: Yes   Region frozen until ice ball extended beyond lesion: Yes   Cryo cycles: 1 or 2. Outcome: patient tolerated procedure well with no complications   Post-procedure details: wound care instructions given   Additional details:  Prior to procedure, discussed risks of blister formation, small wound, skin dyspigmentation, or rare scar following cryotherapy. Recommend Vaseline ointment to treated areas while healing.   MULTIPLE BENIGN NEVI   LENTIGINES   ACTINIC ELASTOSIS   SEBORRHEIC KERATOSES   CHERRY ANGIOMA   Return in about 6 months (around 10/14/2024) for TBSE.  I, Jill Parcell, CMA, am acting as scribe for Boneta Sharps, MD.   Documentation: I have reviewed the above documentation for accuracy and completeness, and I agree with the above.  Boneta Sharps, MD

## 2024-04-15 ENCOUNTER — Ambulatory Visit: Payer: Self-pay | Admitting: Dermatology

## 2024-04-15 LAB — SURGICAL PATHOLOGY

## 2024-04-27 DIAGNOSIS — Z981 Arthrodesis status: Secondary | ICD-10-CM

## 2024-04-27 DIAGNOSIS — M5416 Radiculopathy, lumbar region: Secondary | ICD-10-CM

## 2024-04-27 DIAGNOSIS — M5440 Lumbago with sciatica, unspecified side: Secondary | ICD-10-CM

## 2024-04-28 NOTE — Telephone Encounter (Signed)
 Images requested from [facility] today

## 2024-04-29 ENCOUNTER — Inpatient Hospital Stay
Admission: RE | Admit: 2024-04-29 | Discharge: 2024-04-29 | Disposition: A | Discharge status: Home / Self Care | Payer: Self-pay | Source: Ambulatory Visit | Attending: Neurosurgery | Admitting: Neurosurgery

## 2024-04-29 ENCOUNTER — Other Ambulatory Visit: Payer: Self-pay

## 2024-04-29 DIAGNOSIS — Z049 Encounter for examination and observation for unspecified reason: Secondary | ICD-10-CM

## 2024-04-29 NOTE — Telephone Encounter (Signed)
 CT lumbar images loaded in the chart

## 2024-05-11 ENCOUNTER — Ambulatory Visit
Admission: RE | Admit: 2024-05-11 | Discharge: 2024-05-11 | Disposition: A | Discharge status: Home / Self Care | Source: Ambulatory Visit | Attending: Neurosurgery | Admitting: Neurosurgery

## 2024-05-11 DIAGNOSIS — Z981 Arthrodesis status: Secondary | ICD-10-CM

## 2024-05-11 DIAGNOSIS — M5440 Lumbago with sciatica, unspecified side: Secondary | ICD-10-CM

## 2024-05-11 DIAGNOSIS — M5416 Radiculopathy, lumbar region: Secondary | ICD-10-CM

## 2024-06-08 ENCOUNTER — Encounter: Payer: Self-pay | Admitting: Dermatology

## 2024-06-09 ENCOUNTER — Other Ambulatory Visit: Payer: Self-pay

## 2024-06-09 ENCOUNTER — Other Ambulatory Visit: Payer: Self-pay | Admitting: Dermatology

## 2024-06-09 MED ORDER — FLUOROURACIL 5 % EX CREA: TOPICAL_CREAM | CUTANEOUS | 3 refills | Status: DC

## 2024-06-09 NOTE — Progress Notes (Signed)
 5FU/Calcipotriene cr to [employer]

## 2024-07-10 ENCOUNTER — Other Ambulatory Visit: Payer: Self-pay

## 2024-07-10 DIAGNOSIS — M5416 Radiculopathy, lumbar region: Secondary | ICD-10-CM

## 2024-07-14 ENCOUNTER — Encounter: Payer: Self-pay | Admitting: Neurosurgery

## 2024-07-16 ENCOUNTER — Encounter: Payer: Self-pay | Admitting: Neurosurgery

## 2024-07-16 ENCOUNTER — Ambulatory Visit: Admitting: Neurosurgery

## 2024-07-16 ENCOUNTER — Ambulatory Visit (INDEPENDENT_AMBULATORY_CARE_PROVIDER_SITE_OTHER)

## 2024-07-16 VITALS — BP 132/86 | Ht 67.0 in | Wt 209.0 lb

## 2024-07-16 DIAGNOSIS — Z09 Encounter for follow-up examination after completed treatment for conditions other than malignant neoplasm: Secondary | ICD-10-CM

## 2024-07-16 DIAGNOSIS — M545 Low back pain, unspecified: Secondary | ICD-10-CM | POA: Diagnosis not present

## 2024-07-16 DIAGNOSIS — Z981 Arthrodesis status: Secondary | ICD-10-CM

## 2024-07-16 DIAGNOSIS — G8929 Other chronic pain: Secondary | ICD-10-CM

## 2024-07-16 DIAGNOSIS — M5416 Radiculopathy, lumbar region: Secondary | ICD-10-CM

## 2024-07-16 NOTE — Progress Notes (Unsigned)
   REFERRING PHYSICIAN:  Dot Boot, Pa-c [address] [place],  [place] [address]  DOS: L2-5 XLIF, L5-S1 TLIF, L2-S2 PSF 07/29/23  HISTORY OF PRESENT ILLNESS: Todd Madden is  status post posterior spinal fusion. Overall he is doing well postoperatively.  He is still numb in his anterior thigh on the right.  He has some numbness on his right lower leg.  He is having some pain in his right calf, but it is intermittent.  He is better than he was prior to surgery.   PHYSICAL EXAMINATION:  General: Patient is well developed, well nourished, calm, collected, and in no apparent distress.   NEUROLOGICAL:  General: In no acute distress.   Awake, alert, oriented to person, place, and time.  Pupils equal round and reactive to light.  Facial tone is symmetric.    Strength:            Side Iliopsoas Quads Hamstring PF DF EHL  R 5 5 5 5 5 5   L 5 5 5 5 5 5    Incisions c/d/I and healing well  ROS (Neurologic):  Negative except as noted above  IMAGING: No complications noted  ASSESSMENT/PLAN:  TRINTON PREWITT is doing well after posterior spinal fusion.  I think he will continue to improve.  I will see him back in 6 months.  We did discuss that he has the option of considering a spinal cord stimulator in the future.  I will give him some information about this.  I spent a total of 10 minutes in this patient's care today. This time was spent reviewing pertinent records including imaging studies, obtaining and confirming history, performing a directed evaluation, formulating and discussing my recommendations, and documenting the visit within the medical record.    Reeves Daisy MD Department of neurosurgery

## 2024-07-24 MED ORDER — TIZANIDINE HCL 4 MG PO TABS: 4.0000 mg | ORAL_TABLET | Freq: Three times a day (TID) | ORAL | 0 refills | Status: AC | PRN

## 2024-07-24 NOTE — Addendum Note (Signed)
 Addended byBETHA HILMA HASTINGS on: 07/24/2024 10:58 AM   Modules accepted: Orders

## 2024-08-20 ENCOUNTER — Encounter: Payer: Self-pay | Admitting: Neurosurgery

## 2024-08-20 DIAGNOSIS — M961 Postlaminectomy syndrome, not elsewhere classified: Secondary | ICD-10-CM

## 2024-08-25 NOTE — Telephone Encounter (Signed)
 Patient called 12/9/254 to request additional information regarding [employer] SCS device. [place] he had gotten information from [employer] and [employer], but wanted to get more information about [employer] before deciding where he would like to have his trial and with what company. Spoke with [employer] Rep Manuelita who confirmed that someone would call him to discuss.

## 2024-09-07 ENCOUNTER — Inpatient Hospital Stay: Admission: RE | Admit: 2024-09-07 | Discharge: 2024-09-07 | Attending: Neurosurgery | Admitting: Neurosurgery

## 2024-09-07 DIAGNOSIS — M961 Postlaminectomy syndrome, not elsewhere classified: Secondary | ICD-10-CM

## 2024-10-04 DIAGNOSIS — E785 Hyperlipidemia, unspecified: Secondary | ICD-10-CM | POA: Insufficient documentation

## 2024-10-04 DIAGNOSIS — G4733 Obstructive sleep apnea (adult) (pediatric): Secondary | ICD-10-CM | POA: Insufficient documentation

## 2024-10-04 DIAGNOSIS — M669 Spontaneous rupture of unspecified tendon: Secondary | ICD-10-CM | POA: Insufficient documentation

## 2024-10-04 DIAGNOSIS — M75120 Complete rotator cuff tear or rupture of unspecified shoulder, not specified as traumatic: Secondary | ICD-10-CM | POA: Insufficient documentation

## 2024-10-04 DIAGNOSIS — G894 Chronic pain syndrome: Secondary | ICD-10-CM | POA: Insufficient documentation

## 2024-10-04 DIAGNOSIS — R7689 Other specified abnormal immunological findings in serum: Secondary | ICD-10-CM | POA: Insufficient documentation

## 2024-10-04 DIAGNOSIS — Z789 Other specified health status: Secondary | ICD-10-CM | POA: Insufficient documentation

## 2024-10-04 DIAGNOSIS — M359 Systemic involvement of connective tissue, unspecified: Secondary | ICD-10-CM | POA: Insufficient documentation

## 2024-10-04 DIAGNOSIS — M79606 Pain in leg, unspecified: Secondary | ICD-10-CM | POA: Insufficient documentation

## 2024-10-04 DIAGNOSIS — M25469 Effusion, unspecified knee: Secondary | ICD-10-CM | POA: Insufficient documentation

## 2024-10-04 DIAGNOSIS — M25519 Pain in unspecified shoulder: Secondary | ICD-10-CM | POA: Insufficient documentation

## 2024-10-04 DIAGNOSIS — M1990 Unspecified osteoarthritis, unspecified site: Secondary | ICD-10-CM | POA: Insufficient documentation

## 2024-10-04 DIAGNOSIS — M67919 Unspecified disorder of synovium and tendon, unspecified shoulder: Secondary | ICD-10-CM | POA: Insufficient documentation

## 2024-10-04 DIAGNOSIS — M2010 Hallux valgus (acquired), unspecified foot: Secondary | ICD-10-CM | POA: Insufficient documentation

## 2024-10-04 DIAGNOSIS — M899 Disorder of bone, unspecified: Secondary | ICD-10-CM | POA: Insufficient documentation

## 2024-10-04 DIAGNOSIS — M25549 Pain in joints of unspecified hand: Secondary | ICD-10-CM | POA: Insufficient documentation

## 2024-10-04 DIAGNOSIS — G5600 Carpal tunnel syndrome, unspecified upper limb: Secondary | ICD-10-CM | POA: Insufficient documentation

## 2024-10-04 DIAGNOSIS — Z79899 Other long term (current) drug therapy: Secondary | ICD-10-CM | POA: Insufficient documentation

## 2024-10-04 NOTE — Progress Notes (Unsigned)
 PROVIDER NOTE: Interpretation of information contained herein should be left to medically-trained personnel. Specific patient instructions are provided elsewhere under Patient Instructions section of medical record. This document was created in part using AI and STT-dictation technology, any transcriptional errors that may result from this process are unintentional.  Patient: Todd Madden  Service: E/M Encounter  Provider: Eric DELENA Como, MD  DOB: November 29, 1960  Delivery: Face-to-face  Specialty: Interventional Pain Management  MRN: [id number]  Setting: Ambulatory outpatient facility  Specialty designation: 09  Type: New Patient  Location: Outpatient office facility  PCP: Franchot Houston, DEVONNA  DOS: 10/05/2024    Referring Prov.: Clois Fret, MD   Primary Reason(s) for Visit: Encounter for initial evaluation of one or more chronic problems (new to examiner) potentially causing chronic pain, and posing a threat to normal musculoskeletal function. (Level of risk: High) CC: Back Pain (Lower left )  HPI  [name] is a 64 y.o. year old, male patient, who comes for the first time to our practice referred by Clois Fret, MD for our initial evaluation of his chronic pain. He has Acquired hallux rigidus; Acquired trigger finger; Essential hypertension; Bicipital tenosynovitis; Carpal tunnel syndrome on both sides; Derangement of posterior horn of lateral meniscus; Disorder of bursae of shoulder region; Gout; H/O adenomatous polyp of colon; Bilateral hand pain; Heavy alcohol use; Knee pain; Lesion of ulnar nerve; Mixed hyperlipidemia; Muscle weakness; Neck pain; Old anterior cruciate ligament disruption; Obesity, unspecified; Osteoarthritis of left knee; Gluteal pain; Leg length discrepancy; Somatic dysfunction of spine, sacral; Tear of right gluteus medius tendon; Degenerative lumbar spinal stenosis; Arthritis of right sacroiliac joint; History of lumbar spinal fusion; Acquired scoliosis; Coronal  plane imbalance; Spondylolisthesis of lumbar region; Chronic right-sided low back pain with right-sided sciatica; Lumbar stenosis with neurogenic claudication; Acquired hallux valgus; Trigger finger, right middle finger; Disorder of rotator cuff; Hyperlipidemia; Connective tissue disease; Osteoarthritis; History of knee surgery; Generalized pain of knee region; Shoulder pain; Prediabetes; Elevated antinuclear antibody (ANA) level; Positive ANA (antinuclear antibody); Other specified abnormal immunological findings in serum; Osteoarthritis of finger, right; Osteoarthritis of finger, left; Obstructive sleep apnea syndrome; OSA (obstructive sleep apnea); Nontraumatic rupture of tendon; Knee joint effusion; Full thickness rotator cuff tear; Chronic non-seasonal allergic rhinitis; Carpal tunnel syndrome; Arthralgia of hand; Chronic pain syndrome; Pharmacologic therapy; Disorder of skeletal system; Problems influencing health status; Low back pain of over 3 months duration; Chronic low back pain (1ry area of Pain) (Bilateral) (L>R) w/o sciatica; Spondylosis without myelopathy or radiculopathy, lumbosacral region; S/P fusion of sacroiliac joint (Right); and Failed back surgical syndrome on their problem list. Today he comes in for evaluation of his Back Pain (Lower left )  Pain Assessment: Location: Lower, Left Back Radiating: Lower left back down to left buttocks Onset: More than a month ago Duration: Chronic pain Quality: Aching, Nagging, Discomfort, Numbness, Constant Severity: 8 /10 (subjective, self-reported pain score)  Effect on ADL: Unable to stand for 2-3 mins without pain and has to sit down. Limtis ADLs. Not able to work to full potential. Unable to walk long distances. Timing: Constant Modifying factors: Denies BP: 130/88  HR: 90  Onset and Duration: Gradual, Date of onset: 2023, and Present longer than 3 months Cause of pain: Unknown Severity: No change since onset, NAS-11 at its worse: 8/10,  NAS-11 at its best: 5/10, NAS-11 now: 8/10, and NAS-11 on the average: 8/10 Timing: Not influenced by the time of the day, During activity or exercise, After activity or exercise, and After a period  of immobility Aggravating Factors: Lifiting, Prolonged sitting, Prolonged standing, Walking, Walking uphill, and Walking downhill Alleviating Factors: Denies any alleviating factors Associated Problems: Numbness Quality of Pain: Aching, Agonizing, Annoying, Constant, Nagging, and Uncomfortable Previous Examinations or Tests: CT scan, MRI scan, Nerve block, X-rays, Nerve conduction test, Orthopedic evaluation, and Chiropractic evaluation Previous Treatments: Epidural steroid injections, Facet blocks, and Radiofrequency  Todd Madden is being evaluated for possible interventional pain management therapies for the treatment of his chronic pain.  Discussed the use of AI scribe software for clinical note transcription with the patient, who gave verbal consent to proceed.  History of Present Illness   Todd Madden is a 64 year old male with chronic back pain who presents for evaluation as a candidate for a spinal cord stimulator. He is accompanied by his wife. He was referred by Dr. Katrina for evaluation as a candidate for a spinal cord stimulator.  He has severe chronic lower back pain, predominantly constant on the left and intermittent on the right, ongoing for three years since left knee replacement in 2023. The pain is debilitating and limits participation in physical therapy due to worsening pain with activity.  He underwent lumbar spine surgery with spinal fusion and hardware placement in November 2024 and a right SI joint fusion in 2023, without meaningful relief. He has also had multiple spine injections without benefit.  He has numbness and tingling in the left buttock and right thigh, and his wife notes a limp.  He has not participated in physical therapy for his back since mid-2024 because of  pain severity.        Todd Madden has been informed that this initial visit was an evaluation only.  On the follow up appointment I will go over the results, including ordered tests and available interventional therapies. At that time he will have the opportunity to decide whether to proceed with offered therapies or not. In the event that Mr. Darley prefers avoiding interventional options, this will conclude our involvement in the case.  Medication management recommendations may be provided upon request.  Patient informed that diagnostic tests may be ordered to assist in identifying underlying causes, narrow the list of differential diagnoses and aid in determining candidacy for (or contraindications to) planned therapeutic interventions.  Historic Controlled Substance Pharmacotherapy Review PMP and historical list of controlled substances: Pregabalin 50 Mg Capsule ;Oxycodone  Hcl (Ir) 5 Mg Tablet ; Most recently prescribed controlled substance(s): Opioid Analgesic: Oxycodone  Hcl (Ir) 5 Mg Tablet  MME/day: 22.5-90 mg/day  Historical Monitoring: The patient  reports no history of drug use. List of prior UDS Testing: No results found for: MDMA, COCAINSCRNUR, PCPSCRNUR, PCPQUANT, CANNABQUANT, THCU, ETH, CBDTHCR, D8THCCBX, D9THCCBX Historical Background Evaluation: [place] PMP: PDMP reviewed during this encounter. Review of the past 1-months conducted.             PMP NARX Score Report:  Narcotic: 120 Sedative: 120 Stimulant: 000 [place] Department of public safety, offender search: Engineer, [employer] Information) Non-contributory Risk Assessment Profile: Aberrant behavior: None observed or detected today Risk factors for fatal opioid overdose: None identified today PMP NARX Overdose Risk Score: 270 Fatal overdose hazard ratio (HR): Calculation deferred Non-fatal overdose hazard ratio (HR): Calculation deferred Risk of opioid abuse or dependence: 0.7-3.0% with doses <= 36 MME/day and 6.1-26%  with doses >= 120 MME/day. Substance use disorder (SUD) risk level: See below Personal History of Substance Abuse (SUD-Substance use disorder):  Alcohol:    Illegal Drugs:    Rx Drugs:  ORT Risk Level calculation:    ORT Scoring interpretation table:  Score <3 = Low Risk for SUD  Score between 4-7 = Moderate Risk for SUD  Score >8 = High Risk for Opioid Abuse   PHQ-2 Depression Scale:  Total score: 0  PHQ-2 Scoring interpretation table: (Score and probability of major depressive disorder)  Score 0 = No depression  Score 1 = 15.4% Probability  Score 2 = 21.1% Probability  Score 3 = 38.4% Probability  Score 4 = 45.5% Probability  Score 5 = 56.4% Probability  Score 6 = 78.6% Probability   PHQ-9 Depression Scale:  Total score: 0  PHQ-9 Scoring interpretation table:  Score 0-4 = No depression  Score 5-9 = Mild depression  Score 10-14 = Moderate depression  Score 15-19 = Moderately severe depression  Score 20-27 = Severe depression (2.4 times higher risk of SUD and 2.89 times higher risk of overuse)   Pharmacologic Plan: As per protocol, I have not taken over any controlled substance management, pending the results of ordered tests and/or consults.            Initial impression: Pending review of available data and ordered tests.  Meds  Current Medications[1]  Imaging Review  Thoracic Imaging: Thoracic MR wo contrast: Results for orders placed during the hospital encounter of 09/07/24 MR THORACIC SPINE WO CONTRAST  Narrative EXAM: MR Thoracic Spine without 09/07/2024 11:57:24 AM  TECHNIQUE: Multiplanar multisequence MRI of the thoracic spine was performed without the administration of intravenous contrast.  COMPARISON: None available  CLINICAL HISTORY: Chronic LBP, Failed back surgical syndrome; history of Basal cell carcinoma. history of SI joint fusion and lumbar surgery.  FINDINGS:  BONES AND ALIGNMENT: Normal alignment. Normal vertebral body heights.  Marrow signal is unremarkable.  SPINAL CORD: The spinal cord is normal in morphology and signal intensity.  SOFT TISSUES: Unremarkable.  DEGENERATIVE CHANGES: There is mild diffuse posterior disc bulging at T8-T9 and T9-T10, causing mild central spinal canal stenosis at both levels. There are mild-to-moderate facet hypertrophic changes present at T10-T11, more pronounced on the right, causing mild right-sided spinal canal stenosis. The spinal canal is otherwise widely patent throughout the thoracic spine. No neural foraminal narrowing.  IMPRESSION: 1. Mild central spinal canal stenosis at T8-T9 and T9-T10 due to mild diffuse posterior disc bulging. 2. Mild right-sided spinal canal stenosis at T10-T11 due to mild-to-moderate facet hypertrophic changes, more pronounced on the right.  Electronically signed by: Evalene Coho MD 09/19/2024 05:36 AM EST RP Workstation: [id number]  Lumbosacral Imaging: Lumbar MR wo contrast: Results for orders placed during the hospital encounter of 05/11/24 MR LUMBAR SPINE WO CONTRAST  Narrative CLINICAL DATA:  Provided history: Status post lumbar fusion. Acute left-sided low back pain with sciatica, sciatica laterality unspecified. Lumbar radiculopathy. Prior surgery, new symptoms.  EXAM: MRI LUMBAR SPINE WITHOUT CONTRAST  TECHNIQUE: Multiplanar, multisequence MR imaging of the lumbar spine was performed. No intravenous contrast was administered.  COMPARISON:  Lumbar spine CT 04/09/2024 (images available, report unavailable). Lumbar spine MRI 04/09/2023.  FINDINGS: Segmentation: 5 lumbar vertebrae. The caudal most well-formed intervertebral disc space is designated L5-S1.  Alignment: Levocurvature of the lumbar and visible lower thoracic spine. No significant spondylolisthesis.  Vertebrae: No lumbar vertebral compression fracture. Susceptibility artifact arising from a posterior spinal fusion construct spanning the L2-S1 levels. A  component also extends across the left sacroiliac joint. Susceptibility artifact arising from right sacroiliac fusion hardware. No appreciable marrow edema. Multilevel ventrolateral osteophytes.  Conus medullaris and cauda equina: Conus  extends to the L1 level. No signal abnormality identified within visualized distal spinal cord.  Paraspinal and other soft tissues: No paraspinal mass or collection. Postoperative changes to the dorsal soft tissues at the operative levels.  Disc levels:  Unless otherwise stated, the level by level findings below have not significantly changed from the prior lumbar spine CT of 04/29/2024.  Mild-to-moderate disc degeneration at L1-L2.  T12-L1: Facet arthropathy (greater on the left and moderate on the left). No significant disc herniation or stenosis.  L1-L2: Disc bulge. Moderate facet arthropathy with prominent facet spurring on the right. Facet hypertrophy on the left. Ligamentum flavum hypertrophy. Prominent ligamentum flavum calcification on the right. Mild right subarticular stenosis. No significant central canal stenosis. Bilateral neural foraminal and (moderate-to-severe right, mild left).  L2-L3: Prior posterior spinal fusion. Ventrolateral osteophytes. Facet degeneration and hypertrophy with ligamentum flavum hypertrophy. Mild relative spinal canal stenosis. No significant foraminal stenosis.  L3-L4: Posterior spinal fusion. Ventrolateral osteophytes. Facet degeneration and hypertrophy. Ligamentum flavum hypertrophy on the left. Mild relative spinal canal stenosis posteriorly. Mild-to-moderate neural foraminal narrowing on the right. No significant left foraminal stenosis.  L4-L5: Prior posterior spinal fusion. Endplate osteophytes along the left lateral aspect of the disc space. Facet degeneration and hypertrophy. No significant spinal canal stenosis. Mild left neural foraminal narrowing  L5-S1: Prior posterior spinal fusion. Prior  left laminectomy and medial facetectomy. Endplate osteophytes along the left lateral aspect of the disc space. Facet degeneration and hypertrophy on the right. No significant spinal canal stenosis. Susceptibility artifact precludes evaluation of the left neural foramen.  IMPRESSION: 1. Lumbar spondylosis and lumbosacral postoperative changes (with prior bilateral sacroiliac fusion), as outlined within the body of the report. 2. No more than mild subarticular or spinal canal stenosis. 3. Multilevel foraminal stenosis, greatest on the right at L1-L2 (moderate-to-severe) and on the right at L3-L4 (mild-to-moderate). Please note, susceptibility artifact precludes evaluation of the left L5-S1 neural foramen 4. Levocurvature of the lumbar and visible lower thoracic spine.   Electronically Signed By: Rockey Childs D.O. On: 05/15/2024 16:05  Lumbar DG 2-3 views: Results for orders placed in visit on 07/16/24 DG Lumbar Spine 2-3 Views  Narrative CLINICAL DATA:  Fusion, 1 year follow-up  EXAM: LUMBAR SPINE - 2-3 VIEW  COMPARISON:  MR lumbar spine May 11, 2024.  FINDINGS: Status post lumbar spine fusion device/rod and screws through L2-S1. Sacroiliac fusion device. No hardware complication. There is no evidence of lumbar spine fracture. Alignment is normal. Levoconvex scoliosis. No anterolisthesis. Intervertebral disc spaces are maintained.  IMPRESSION: Posterior spinal interbody fusion, stable to prior. No hardware complication.   Electronically Signed By: Megan  Zare M.D. On: 07/17/2024 18:07  Spine Imaging: CT-Guided Biopsy: Results for orders placed during the hospital encounter of 09/28/22 CT BIOPSY  Narrative CLINICAL DATA:  Chronic low back and right hip pain. Good response to previous sacroiliac anesthetic injection.  EXAM: RIGHT CT GUIDED SI JOINT INJECTION  PROCEDURE: After a thorough discussion of risks and benefits of the procedure, including bleeding,  infection, injury to nerves, blood vessels, and adjacent structures, verbal and written consent was obtained. Specific risks of the procedure included nondiagnostic/nontherapeutic injection and non target injection. The patient was placed prone on the CT table and localization was performed over the sacrum. Target site marked using CT guidance. The skin was prepped and draped in the usual sterile fashion using Betadine soap.  After local anesthesia with 1% lidocaine  without epinephrine  and subsequent deep anesthesia, a 3.5 inch 22 gauge curved spinal  needle was advanced into the posteroinferior aspect right SI joint under intermittent CT guidance.  Once the needle was in satisfactory position, representative image was captured with the needle demonstrated in the sacroiliac joint. Subsequently, 80 mg Depo-Medrol  with 2 mL lidocaine  1% was injected into the right SI joint. Needles removed and a sterile dressing applied.  No complications were observed.  RADIATION DOSE REDUCTION: This exam was performed according to the departmental dose-optimization program which includes automated exposure control, adjustment of the mA and/or kV according to patient size and/or use of iterative reconstruction technique.  IMPRESSION: Technically successful CT-guided right SI joint injection.   Electronically Signed By: JONETTA Faes M.D. On: 09/28/2022 09:40  Hip Imaging: Hip-B DG Bilateral (5V): Results for orders placed during the hospital encounter of 03/05/24 DG HIPS BILAT WITH PELVIS MIN 5 VIEWS  Narrative CLINICAL DATA:  Hip pain  EXAM: DG HIP (WITH OR WITHOUT PELVIS) 5+V BILAT  COMPARISON:  None Available.  FINDINGS: There is no evidence of hip fracture or dislocation. There are mild degenerative changes of both hips. Fusion hardware across the sacroiliac joints in the lower lumbar region again noted.  IMPRESSION: Mild degenerative changes of both hips.   Electronically  Signed By: Greig Pique M.D. On: 03/10/2024 20:27  Complexity Note: Imaging results reviewed.                         ROS  Cardiovascular: Daily Aspirin intake and High blood pressure Pulmonary or Respiratory: Snoring  Neurological: No reported neurological signs or symptoms such as seizures, abnormal skin sensations, urinary and/or fecal incontinence, being born with an abnormal open spine and/or a tethered spinal cord Psychological-Psychiatric: No reported psychological or psychiatric signs or symptoms such as difficulty sleeping, anxiety, depression, delusions or hallucinations (schizophrenial), mood swings (bipolar disorders) or suicidal ideations or attempts Gastrointestinal: No reported gastrointestinal signs or symptoms such as vomiting or evacuating blood, reflux, heartburn, alternating episodes of diarrhea and constipation, inflamed or scarred liver, or pancreas or irrregular and/or infrequent bowel movements Genitourinary: Difficulty emptying the bladder or controlling the flow of urine (Neurogenic bladder) Hematological: No reported hematological signs or symptoms such as prolonged bleeding, low or poor functioning platelets, bruising or bleeding easily, hereditary bleeding problems, low energy levels due to low hemoglobin or being anemic Endocrine: No reported endocrine signs or symptoms such as high or low blood sugar, rapid heart rate due to high thyroid levels, obesity or weight gain due to slow thyroid or thyroid disease Rheumatologic: No reported rheumatological signs and symptoms such as fatigue, joint pain, tenderness, swelling, redness, heat, stiffness, decreased range of motion, with or without associated rash Musculoskeletal: Negative for myasthenia gravis, muscular dystrophy, multiple sclerosis or malignant hyperthermia Work History: Working full time  Allergies  Mr. Denz has no known allergies.  Laboratory Chemistry Profile   Renal Lab Results  Component Value  Date   BUN 17 05/15/2022   CREATININE 0.81 07/30/2023   GFR 89.03 05/15/2022   GFRAA >60 07/15/2012   GFRNONAA >60 07/30/2023   PROTEINUR NEGATIVE 07/17/2023     Electrolytes Lab Results  Component Value Date   NA 136 05/15/2022   K 3.7 05/15/2022   CL 100 05/15/2022   CALCIUM  9.8 05/15/2022   CALCIUM  9.9 05/15/2022     Hepatic Lab Results  Component Value Date   AST 33 05/15/2022   ALT 39 05/15/2022   ALBUMIN  4.6 05/15/2022   ALKPHOS 59 05/15/2022   LIPASE 37 11/10/2021  ID Lab Results  Component Value Date   SARSCOV2NAA Not Detected 07/13/2019   STAPHAUREUS POSITIVE (A) 07/17/2023   MRSAPCR POSITIVE (A) 07/17/2023     Bone Lab Results  Component Value Date   VD25OH 21.37 (L) 05/15/2022   TESTOSTERONE  302.59 05/15/2022     Endocrine Lab Results  Component Value Date   GLUCOSE 94 05/15/2022   GLUCOSEU NEGATIVE 07/17/2023   TSH 2.14 05/15/2022   TESTOSTERONE  302.59 05/15/2022     Neuropathy No results found for: VITAMINB12, FOLATE, HGBA1C, HIV   CNS No results found for: COLORCSF, APPEARCSF, RBCCOUNTCSF, WBCCSF, POLYSCSF, LYMPHSCSF, EOSCSF, PROTEINCSF, GLUCCSF, JCVIRUS, CSFOLI, IGGCSF, LABACHR, ACETBL   Inflammation (CRP: Acute  ESR: Chronic) Lab Results  Component Value Date   CRP <1.0 05/15/2022   ESRSEDRATE 34 (H) 05/15/2022     Rheumatology Lab Results  Component Value Date   RF <14 05/15/2022   ANA POSITIVE (A) 05/15/2022   LABURIC 6.9 05/15/2022     Coagulation Lab Results  Component Value Date   PLT 133 (L) 07/31/2023     Cardiovascular Lab Results  Component Value Date   HGB 9.3 (L) 07/31/2023   HCT 28.0 (L) 07/31/2023     Screening Lab Results  Component Value Date   SARSCOV2NAA Not Detected 07/13/2019   STAPHAUREUS POSITIVE (A) 07/17/2023   MRSAPCR POSITIVE (A) 07/17/2023     Cancer No results found for: CEA, CA125, LABCA2   Allergens No results found for: ALMOND,  APPLE, ASPARAGUS, AVOCADO, BANANA, BARLEY, BASIL, BAYLEAF, GREENBEAN, LIMABEAN, WHITEBEAN, BEEFIGE, REDBEET, BLUEBERRY, BROCCOLI, CABBAGE, MELON, CARROT, CASEIN, CASHEWNUT, CAULIFLOWER, CELERY     Note: Lab results reviewed.  PFSH  Drug: Mr. Dumond  reports no history of drug use. Alcohol:  reports current alcohol use of about 6.0 - 8.0 standard drinks of alcohol per week. Tobacco:  reports that he has never smoked. He has never used smokeless tobacco. 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 Family: family history is not on file.  Past Surgical History:  Procedure Laterality Date    TENDON SHEATH INCISION (EG, FOR TRIGGER FINGER)-THUMB Right 09/06/2017   ANTERIOR LUMBAR FUSION N/A 07/29/2023   Procedure: L2-5 LATERAL LUMBAR INTERBODY FUSION;  Surgeon: Clois Fret, MD;  Location: [facility];  Service: Neurosurgery;  Laterality: N/A;   APPLICATION OF INTRAOPERATIVE CT SCAN N/A 07/29/2023   Procedure: APPLICATION OF INTRAOPERATIVE CT SCAN;  Surgeon: Clois Fret, MD;  Location: [facility];  Service: Neurosurgery;  Laterality: N/A;   CARPAL TUNNEL RELEASE Bilateral    COLONOSCOPY W/ POLYPECTOMY     INJECTION(S); SINGLE TENDON ORIGIN/INSERTION mIddle finger Right 08/11/2019   JOINT REPLACEMENT     TKA Left   KNEE ARTHROSCOPY     KNEE ARTHROSCOPY W/ MENISCECTOMY Left    NEUROPLASTY AND/OR TRANSPOSITION; MEDIAN NERVE AT CARPAL  TUNNEL Left 06/05/2016   NEUROPLASTY AND/OR TRANSPOSITION; MEDIAN NERVE AT CARPAL TUNNEL Right 06/26/2016   OLECRANON BURSECTOMY     POSTERIOR LUMBAR FUSION 4 LEVEL N/A 07/29/2023   Procedure: L2-S2 POSTERIOR SPINAL FUSION;  Surgeon: Clois Fret, MD;  Location: [facility];  Service: Neurosurgery;  Laterality: N/A;   REPLACEMENT  TOTAL KNEE Left    ROTATOR CUFF REPAIR Left    ROTATOR CUFF REPAIR Right    TENDON SHEATH INCISION (EG, FOR TRIGGER FINGER) Middle Left 08/11/2019   TRANSFORAMINAL LUMBAR INTERBODY FUSION (TLIF) WITH PEDICLE SCREW FIXATION 1 LEVEL N/A 07/29/2023   Procedure: OPEN L5-S1 TRANSFORAMINAL LUMBAR INTERBODY FUSION (TLIF);  Surgeon: Clois Fret, MD;  Location: [facility];  Service: Neurosurgery;  Laterality: N/A;   Active Ambulatory Problems    Diagnosis Date Noted   Acquired hallux rigidus 01/22/2018   Acquired trigger finger 10/30/2018   Essential hypertension 07/03/2011   Bicipital tenosynovitis 01/22/2018   Carpal tunnel syndrome on both sides 10/01/2013   Derangement of posterior horn of lateral meniscus 01/22/2018   Disorder of bursae of shoulder region 01/22/2018   Gout 11/03/2012   H/O adenomatous polyp of colon 05/04/2016   Bilateral hand pain 03/03/2019   Heavy alcohol use 07/03/2011   Knee pain 01/22/2018   Lesion of ulnar nerve 01/22/2018   Mixed hyperlipidemia 07/03/2011   Muscle weakness 01/22/2018   Neck pain 01/22/2018   Old anterior cruciate ligament disruption 01/22/2018   Obesity, unspecified 07/03/2011   Osteoarthritis of left knee 01/22/2018   Gluteal pain 05/15/2022   Leg length discrepancy 05/15/2022   Somatic dysfunction of spine, sacral 05/15/2022   Tear of right gluteus medius tendon 06/05/2022   Degenerative lumbar spinal stenosis 07/17/2022   Arthritis of right sacroiliac joint 08/29/2022   History of lumbar spinal fusion 07/29/2023   Acquired scoliosis 07/29/2023   Coronal plane imbalance 07/29/2023   Spondylolisthesis  of lumbar region 07/29/2023   Chronic right-sided low back pain with right-sided sciatica 07/29/2023   Lumbar stenosis with neurogenic claudication 07/29/2023   Acquired hallux valgus 10/04/2024   Trigger finger, right middle finger 01/14/2024   Disorder of rotator cuff 10/04/2024   Hyperlipidemia 10/04/2024   Connective tissue disease 10/04/2024   Osteoarthritis 10/04/2024   History of knee surgery 11/08/2021   Generalized pain of knee region 10/04/2024   Shoulder pain 10/04/2024   Prediabetes 03/12/2023   Elevated antinuclear antibody (ANA) level 10/04/2024   Positive ANA (antinuclear antibody) 04/03/2024   Other specified abnormal immunological findings in serum 11/26/2022   Osteoarthritis of finger, right 10/05/2020   Osteoarthritis of finger, left 10/05/2020   Obstructive sleep apnea syndrome 10/04/2024   OSA (obstructive sleep apnea) 06/03/2019   Nontraumatic rupture of tendon 10/04/2024   Knee joint effusion 10/04/2024   Full thickness rotator cuff tear 10/04/2024   Chronic non-seasonal allergic rhinitis 05/17/2020   Carpal tunnel syndrome 10/04/2024   Arthralgia of hand 10/04/2024   Chronic pain syndrome 10/04/2024   Pharmacologic therapy 10/04/2024   Disorder of skeletal system 10/04/2024   Problems influencing health status 10/04/2024   Low back pain of over 3 months duration 10/05/2024   Chronic low back pain (1ry area of Pain) (Bilateral) (L>R) w/o sciatica 10/05/2024   Spondylosis without myelopathy or radiculopathy, lumbosacral region 10/05/2024   S/P fusion of sacroiliac joint (Right) 10/05/2024   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   Resolved Ambulatory Problems    Diagnosis Date Noted   No Resolved Ambulatory Problems   Past Medical History:  Diagnosis Date   Actinic keratosis    Alcohol use    Basal cell carcinoma 04/18/2020   Basal cell carcinoma 08/16/2020   Basal cell carcinoma 10/12/2020   Basal cell carcinoma 12/05/2020   Basal cell  carcinoma 04/18/2022   BCC (basal cell carcinoma of skin) 10/30/2022   Chronic back pain  ED (erectile dysfunction)    Hypertension    Nose colonized with MRSA 07/17/2023   Scoliosis of lumbar spine    Skin cancer    Spondylolisthesis    Constitutional Exam  General appearance: Well nourished, well developed, and well hydrated. In no apparent acute distress Vitals:   10/05/24 0957  BP: 130/88  Pulse: 90  Resp: 16  Temp: (!) 97.2 F (36.2 C)  TempSrc: Temporal  SpO2: 98%  Weight: 205 lb (93 kg)  Height: 5' 7 (1.702 m)   BMI Assessment: Estimated body mass index is 32.11 kg/m as calculated from the following:   Height as of this encounter: 5' 7 (1.702 m).   Weight as of this encounter: 205 lb (93 kg).  BMI interpretation table: BMI level Category Range association with higher incidence of chronic pain  <18 kg/m2 Underweight   18.5-24.9 kg/m2 Ideal body weight   25-29.9 kg/m2 Overweight Increased incidence by 20%  30-34.9 kg/m2 Obese (Class I) Increased incidence by 68%  35-39.9 kg/m2 Severe obesity (Class II) Increased incidence by 136%  >40 kg/m2 Extreme obesity (Class III) Increased incidence by 254%   Patient's current BMI Ideal Body weight  Body mass index is 32.11 kg/m. Ideal body weight: 66.1 kg (145 lb 11.6 oz) Adjusted ideal body weight: 76.9 kg (169 lb 6.9 oz)   BMI Readings from Last 4 Encounters:  10/05/24 32.11 kg/m  07/16/24 32.73 kg/m  03/05/24 32.73 kg/m  01/16/24 31.47 kg/m   Wt Readings from Last 4 Encounters:  10/05/24 205 lb (93 kg)  07/16/24 209 lb (94.8 kg)  03/05/24 209 lb (94.8 kg)  01/16/24 207 lb (93.9 kg)    Psych/Mental status: Alert, oriented x 3 (person, place, & time)       Eyes: PERLA Respiratory: No evidence of acute respiratory distress  Assessment  Primary Diagnosis & Pertinent Problem List: The primary encounter diagnosis was Chronic pain syndrome. Diagnoses of Pharmacologic therapy, Disorder of skeletal system,  Problems influencing health status, Low back pain of over 3 months duration, Chronic low back pain (1ry area of Pain) (Bilateral) (L>R) w/o sciatica, Spondylosis without myelopathy or radiculopathy, lumbosacral region, S/P fusion of sacroiliac joint (Right), Failed back surgical syndrome, and History of lumbar spinal fusion were also pertinent to this visit.  Visit Diagnosis (New problems to examiner): 1. Chronic pain syndrome   2. Pharmacologic therapy   3. Disorder of skeletal system   4. Problems influencing health status   5. Low back pain of over 3 months duration   6. Chronic low back pain (1ry area of Pain) (Bilateral) (L>R) w/o sciatica   7. Spondylosis without myelopathy or radiculopathy, lumbosacral region   8. S/P fusion of sacroiliac joint (Right)   9. Failed back surgical syndrome   10. History of lumbar spinal fusion    Plan of Care (Initial workup plan)  Note: Mr. Eller was reminded that as per protocol, today's visit has been an evaluation only. We have not taken over the patient's controlled substance management.  Problem-specific plan: Assessment and Plan    Chronic low back pain with failed back surgery syndrome   He has experienced chronic low back pain for three years, primarily on the left side with intermittent right side pain. Previous spine and SI joint fusions have not alleviated the pain, which may be exacerbated by compensatory mechanisms due to a left knee replacement and limp. Extensive interventional treatments, including injections, have been ineffective. Current imaging shows hardware in place and some narrowing  in the thoracic spine, but not severe enough to preclude spinal cord stimulator placement. An x-ray of the lumbar spine and SI joints with flexion and extension has been ordered to assess for abnormal movement or hardware issues. Lab work will be updated to assess kidney and liver function prior to potential surgical intervention.  Arthrodesis status of  lumbar spine and sacroiliac joint   He is status post lumbar spine fusion and right SI joint fusion. The fusion did not alleviate back pain and may have contributed to compensatory pain mechanisms. Hardware in place may limit further surgical options.  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 Pain management has been challenging with limited success from previous interventions.  Planned spinal cord stimulator trial   A spinal cord stimulator trial is considered as the next alternative due to limited success of previous treatments. MRI of the thoracic spine shows areas of narrowing but not severe enough to preclude the trial. The trial will be conducted as an outpatient procedure with IV sedation to assess stimulation coverage. Risks include potential stimulation in lower extremities and discomfort if placed near ribs. Permanent device placement will be considered by neurosurgeons if the trial is successful. The spinal cord stimulator trial will be scheduled pending insurance approval. All previous medical records and imaging will be reviewed to finalize the treatment plan. A follow-up appointment will be scheduled in two weeks to discuss trial results and further management.       Lab Orders         Compliance Drug Analysis, Ur         Comp. Metabolic Panel (12)         Magnesium          Vitamin B12         Sedimentation rate         25-Hydroxy vitamin D  Lcms D2+D3         C-reactive protein     Imaging Orders         DG Lumbar Spine Complete W/Bend         DG Si Joints     Referral Orders  No referral(s) requested today   Procedure Orders    No procedure(s) ordered today   Pharmacotherapy (current): Medications ordered:  No orders of the defined types were placed in this encounter.  Medications administered  during this visit: Taiven B. Nicodemus had no medications administered during this visit.   Analgesic Pharmacotherapy:  Opioid Analgesics: For patients currently taking or requesting to take opioid analgesics, in accordance with [place]  Medical Board Guidelines, we will assess their risks and indications for the use of these substances. After completing our evaluation, we may offer recommendations, but we no longer take patients for medication management. The prescribing physician will ultimately decide, based on his/her training and level of comfort whether to adopt any of the recommendations, including whether or not to prescribe such medicines.  Membrane stabilizer: To be determined at a later time  Muscle relaxant: To be determined at a later time  NSAID: To be determined at a later time  Other analgesic(s): To be determined at a later time   Interventional management options: Mr. Treptow was informed that there is no guarantee that he would be a candidate for interventional therapies. The decision will be based on the results of diagnostic studies, as  well as Mr. Manna risk profile.  Procedure(s) under consideration:  Pending results of ordered studies     Interventional Therapies  Risk Factors  Considerations  Medical Comorbidities:           Planned  Pending:      Under consideration:   Pending   Completed: (Analgesic benefit)1  None at this time   Therapeutic  Palliative (PRN) options:   None established   Completed by other providers:   Multiple interventional procedures by other providers/practices including [facility], [facility], and [facility].   1(Analgesic benefit): Expressed in percentage (%). (Local anesthetic[LA] +/- sedation  L.A.Local Anesthetic  Steroid benefit  Ongoing benefit)   Provider-requested follow-up: Return in about 2 weeks (around 10/19/2024) for ( ), Eval-day (M,W), (Face2F), 2nd Visit, to review of ordered test(s).  Future Appointments  Date  Time Provider Department Center  10/15/2024  9:00 AM Claudene Lehmann, MD [facility] None  10/19/2024  8:00 AM Tanya Glisson, MD [facility] None   I discussed the assessment and treatment plan with the patient. The patient was provided an opportunity to ask questions and all were answered. The patient agreed with the plan and demonstrated an understanding of the instructions.  Patient advised to call back or seek an in-person evaluation if the symptoms or condition worsens.  Duration of encounter: 45 minutes.  Total time on encounter, as per AMA guidelines included both the face-to-face and non-face-to-face time personally spent by the physician and/or other qualified health care professional(s) on the day of the encounter (includes time in activities that require the physician or other qualified health care professional and does not include time in activities normally performed by clinical staff). Physician's time may include the following activities when performed: Preparing to see the patient (e.g., pre-charting review of records, searching for previously ordered imaging, lab work, and nerve conduction tests) Review of prior analgesic pharmacotherapies. Reviewing PMP Interpreting ordered tests (e.g., lab work, imaging, nerve conduction tests) Performing post-procedure evaluations, including interpretation of diagnostic procedures Obtaining and/or reviewing separately obtained history Performing a medically appropriate examination and/or evaluation Counseling and educating the patient/family/caregiver Ordering medications, tests, or procedures Referring and communicating with other health care professionals (when not separately reported) Documenting clinical information in the electronic or other health record Independently interpreting results (not separately reported) and communicating results to the patient/ family/caregiver Care coordination (not separately reported)  Note by:  Glisson DELENA Tanya, MD (TTS and AI technology used. I apologize for any typographical errors that were not detected and corrected.) Date: 10/05/2024; Time: 3:59 PM     [1]  Current Outpatient Medications:    acetaminophen  (TYLENOL ) 500 MG tablet, Take 1,000 mg by mouth every 6 (six) hours as needed., Disp: , Rfl:    allopurinol  (ZYLOPRIM ) 100 MG tablet, Take 100 mg by mouth daily., Disp: , Rfl:    atorvastatin  (LIPITOR) 80 MG tablet, Take 80 mg by mouth daily., Disp: , Rfl:    colchicine 0.6 MG tablet, TAKE 2 TABS @ ONSET OF FLARE FOLLOWED BY 1 TAB 1 HOUR LATER MAX OF 3 TABS IN 1 attack.  May repeat in 3 days, Disp: , Rfl:    fexofenadine (ALLEGRA) 180 MG tablet, Take 180 mg by mouth daily., Disp: , Rfl:    hydrochlorothiazide  (HYDRODIURIL ) 12.5 MG tablet, Take 12.5 mg by mouth daily., Disp: , Rfl:    lisinopril  (ZESTRIL ) 20 MG tablet, Take 20 mg by mouth daily., Disp: , Rfl:    tiZANidine  (ZANAFLEX ) 4 MG tablet, Take 1 tablet (4  mg total) by mouth every 8 (eight) hours as needed for muscle spasms., Disp: 90 tablet, Rfl: 0   verapamil  (CALAN -SR) 240 MG CR tablet, Take 240 mg by mouth daily., Disp: , Rfl:

## 2024-10-04 NOTE — Patient Instructions (Signed)

## 2024-10-05 ENCOUNTER — Ambulatory Visit: Admitting: Pain Medicine

## 2024-10-05 ENCOUNTER — Ambulatory Visit
Admission: RE | Admit: 2024-10-05 | Discharge: 2024-10-05 | Disposition: A | Source: Ambulatory Visit | Attending: Pain Medicine | Admitting: Pain Medicine

## 2024-10-05 ENCOUNTER — Encounter: Payer: Self-pay | Admitting: Pain Medicine

## 2024-10-05 VITALS — BP 130/88 | HR 90 | Temp 97.2°F | Resp 16 | Ht 67.0 in | Wt 205.0 lb

## 2024-10-05 DIAGNOSIS — Z79899 Other long term (current) drug therapy: Secondary | ICD-10-CM

## 2024-10-05 DIAGNOSIS — Z789 Other specified health status: Secondary | ICD-10-CM

## 2024-10-05 DIAGNOSIS — M961 Postlaminectomy syndrome, not elsewhere classified: Secondary | ICD-10-CM | POA: Diagnosis not present

## 2024-10-05 DIAGNOSIS — Z981 Arthrodesis status: Secondary | ICD-10-CM | POA: Insufficient documentation

## 2024-10-05 DIAGNOSIS — M899 Disorder of bone, unspecified: Secondary | ICD-10-CM

## 2024-10-05 DIAGNOSIS — M545 Low back pain, unspecified: Secondary | ICD-10-CM

## 2024-10-05 DIAGNOSIS — G8929 Other chronic pain: Secondary | ICD-10-CM | POA: Diagnosis present

## 2024-10-05 DIAGNOSIS — M47817 Spondylosis without myelopathy or radiculopathy, lumbosacral region: Secondary | ICD-10-CM | POA: Insufficient documentation

## 2024-10-05 DIAGNOSIS — G894 Chronic pain syndrome: Secondary | ICD-10-CM

## 2024-10-05 NOTE — Progress Notes (Signed)
 Safety precautions to be maintained throughout the outpatient stay will include: orient to surroundings, keep bed in low position, maintain call bell within reach at all times, provide assistance with transfer out of bed and ambulation.

## 2024-10-06 ENCOUNTER — Other Ambulatory Visit: Payer: Self-pay | Admitting: Pain Medicine

## 2024-10-12 LAB — COMP. METABOLIC PANEL (12)
AST: 33 [IU]/L (ref 0–40)
Albumin: 4.8 g/dL (ref 3.9–4.9)
Alkaline Phosphatase: 71 [IU]/L (ref 47–123)
BUN/Creatinine Ratio: 16 (ref 10–24)
BUN: 16 mg/dL (ref 8–27)
Bilirubin Total: 0.5 mg/dL (ref 0.0–1.2)
Calcium: 10.2 mg/dL (ref 8.6–10.2)
Chloride: 100 mmol/L (ref 96–106)
Creatinine, Ser: 1.02 mg/dL (ref 0.76–1.27)
Globulin, Total: 2.9 g/dL (ref 1.5–4.5)
Glucose: 107 mg/dL — ABNORMAL HIGH (ref 70–99)
Potassium: 4.5 mmol/L (ref 3.5–5.2)
Sodium: 138 mmol/L (ref 134–144)
Total Protein: 7.7 g/dL (ref 6.0–8.5)
eGFR: 83 mL/min/{1.73_m2}

## 2024-10-12 LAB — SEDIMENTATION RATE: Sed Rate: 43 mm/h — ABNORMAL HIGH (ref 0–30)

## 2024-10-12 LAB — C-REACTIVE PROTEIN: CRP: <1 mg/L (ref 0–10)

## 2024-10-12 LAB — MAGNESIUM: Magnesium: 2.2 mg/dL (ref 1.6–2.3)

## 2024-10-12 LAB — 25-HYDROXY VITAMIN D LCMS D2+D3
25-Hydroxy, Vitamin D-2: 3 ng/mL
25-Hydroxy, Vitamin D-3: 23 ng/mL
25-Hydroxy, Vitamin D: 26 ng/mL — ABNORMAL LOW

## 2024-10-12 LAB — VITAMIN B12: Vitamin B-12: 363 pg/mL (ref 232–1245)

## 2024-10-14 ENCOUNTER — Encounter: Payer: Self-pay | Admitting: Pain Medicine

## 2024-10-14 DIAGNOSIS — R892 Abnormal level of other drugs, medicaments and biological substances in specimens from other organs, systems and tissues: Secondary | ICD-10-CM | POA: Insufficient documentation

## 2024-10-14 LAB — COMPLIANCE DRUG ANALYSIS, UR

## 2024-10-15 ENCOUNTER — Ambulatory Visit (INDEPENDENT_AMBULATORY_CARE_PROVIDER_SITE_OTHER): Admitting: Dermatology

## 2024-10-15 ENCOUNTER — Encounter: Payer: Self-pay | Admitting: Dermatology

## 2024-10-15 DIAGNOSIS — C44612 Basal cell carcinoma of skin of right upper limb, including shoulder: Secondary | ICD-10-CM

## 2024-10-15 DIAGNOSIS — D492 Neoplasm of unspecified behavior of bone, soft tissue, and skin: Secondary | ICD-10-CM

## 2024-10-15 DIAGNOSIS — Z1283 Encounter for screening for malignant neoplasm of skin: Secondary | ICD-10-CM

## 2024-10-15 DIAGNOSIS — L821 Other seborrheic keratosis: Secondary | ICD-10-CM | POA: Diagnosis not present

## 2024-10-15 DIAGNOSIS — D1801 Hemangioma of skin and subcutaneous tissue: Secondary | ICD-10-CM

## 2024-10-15 DIAGNOSIS — D225 Melanocytic nevi of trunk: Secondary | ICD-10-CM

## 2024-10-15 DIAGNOSIS — L578 Other skin changes due to chronic exposure to nonionizing radiation: Secondary | ICD-10-CM

## 2024-10-15 DIAGNOSIS — L814 Other melanin hyperpigmentation: Secondary | ICD-10-CM | POA: Diagnosis not present

## 2024-10-15 DIAGNOSIS — Z85828 Personal history of other malignant neoplasm of skin: Secondary | ICD-10-CM | POA: Diagnosis not present

## 2024-10-15 DIAGNOSIS — W908XXA Exposure to other nonionizing radiation, initial encounter: Secondary | ICD-10-CM | POA: Diagnosis not present

## 2024-10-15 DIAGNOSIS — D229 Melanocytic nevi, unspecified: Secondary | ICD-10-CM

## 2024-10-15 DIAGNOSIS — D239 Other benign neoplasm of skin, unspecified: Secondary | ICD-10-CM

## 2024-10-15 HISTORY — DX: Other benign neoplasm of skin, unspecified: D23.9

## 2024-10-15 NOTE — Progress Notes (Signed)
 "  Follow-Up Visit   Subjective  Todd Madden is a 64 y.o. male who presents for the following: Skin Cancer Screening and Full Body Skin Exam. Hx of BCCs. Hx of AKs. Has used 5FU/Calcipotriene in the past.   The patient presents for Total-Body Skin Exam (TBSE) for skin cancer screening and mole check. The patient has spots, moles and lesions to be evaluated, some may be new or changing and the patient may have concern these could be cancer.    The following portions of the chart were reviewed this encounter and updated as appropriate: medications, allergies, medical history  Review of Systems:  No other skin or systemic complaints except as noted in HPI or Assessment and Plan.  Objective  Well appearing patient in no apparent distress; mood and affect are within normal limits.  A full examination was performed including scalp, head, eyes, ears, nose, lips, neck, chest, axillae, abdomen, back, buttocks, bilateral upper extremities, bilateral lower extremities, hands, feet, fingers, toes, fingernails, and toenails. All findings within normal limits unless otherwise noted below.   Relevant physical exam findings are noted in the Assessment and Plan.  Left Superior Buttock 8 mm irregular brown macule  Right upper lateral arm 7 mm pink papule with telangiectasias    Assessment & Plan   SKIN CANCER SCREENING PERFORMED TODAY.  HISTORY OF BASAL CELL CARCINOMA OF THE SKIN. Multiple, see history.  - No evidence of recurrence today - Recommend regular full body skin exams - Recommend daily broad spectrum sunscreen SPF 30+ to sun-exposed areas, reapply every 2 hours as needed.  - Call if any new or changing lesions are noted between office visits   ACTINIC DAMAGE - Chronic condition, secondary to cumulative UV/sun exposure - diffuse scaly erythematous macules with underlying dyspigmentation - Recommend daily broad spectrum sunscreen SPF 30+ to sun-exposed areas, reapply every 2 hours as  needed.  - Staying in the shade or wearing long sleeves, sun glasses (UVA+UVB protection) and wide brim hats (4-inch brim around the entire circumference of the hat) are also recommended for sun protection.  - Call for new or changing lesions.  LENTIGINES, SEBORRHEIC KERATOSES, HEMANGIOMAS - Benign normal skin lesions - Benign-appearing - Call for any changes  MELANOCYTIC NEVI - Tan-brown and/or pink-flesh-colored symmetric macules and papules - Benign appearing on exam today - Observation - Call clinic for new or changing moles - Recommend daily use of broad spectrum spf 30+ sunscreen to sun-exposed areas.       NEOPLASM OF SKIN (2) Left Superior Buttock - Epidermal / dermal shaving  Lesion diameter (cm):  0.8 Informed consent: discussed and consent obtained   Timeout: patient name, date of birth, surgical site, and procedure verified   Procedure prep:  Patient was prepped and draped in usual sterile fashion Prep type:  Isopropyl alcohol Anesthesia: the lesion was anesthetized in a standard fashion   Anesthetic:  1% lidocaine  w/ epinephrine  1-100,000 buffered w/ 8.4% NaHCO3 Instrument used: DermaBlade   Hemostasis achieved with: pressure and aluminum chloride   Outcome: patient tolerated procedure well   Post-procedure details: wound care instructions given    Specimen 1 - Surgical pathology Differential Diagnosis: Dysplastic nevus vs melanoma  Check Margins: Yes Right upper lateral arm - Skin / nail biopsy Type of biopsy: tangential   Informed consent: discussed and consent obtained   Timeout: patient name, date of birth, surgical site, and procedure verified   Procedure prep:  Patient was prepped and draped in usual sterile fashion Prep  type:  Isopropyl alcohol Anesthesia: the lesion was anesthetized in a standard fashion   Anesthetic:  1% lidocaine  w/ epinephrine  1-100,000 buffered w/ 8.4% NaHCO3 Instrument used: DermaBlade   Hemostasis achieved with: pressure and  aluminum chloride   Outcome: patient tolerated procedure well   Post-procedure details: sterile dressing applied and wound care instructions given   Dressing type: bandage and petrolatum    Specimen 2 - Surgical pathology Differential Diagnosis: BCC vs ISK  Check Margins: No MULTIPLE BENIGN NEVI   LENTIGINES   ACTINIC ELASTOSIS   SEBORRHEIC KERATOSES   CHERRY ANGIOMA    Return in about 1 year (around 10/15/2025) for TBSE, HxBCC.  I, Jill Parcell, CMA, am acting as scribe for Boneta Sharps, MD.   Documentation: I have reviewed the above documentation for accuracy and completeness, and I agree with the above.  Boneta Sharps, MD    "

## 2024-10-15 NOTE — Patient Instructions (Addendum)

## 2024-10-16 LAB — SURGICAL PATHOLOGY

## 2024-10-19 ENCOUNTER — Ambulatory Visit: Admitting: Pain Medicine

## 2024-10-19 ENCOUNTER — Ambulatory Visit: Payer: Self-pay | Admitting: Dermatology

## 2024-10-20 ENCOUNTER — Encounter: Payer: Self-pay | Admitting: Dermatology

## 2024-10-20 NOTE — Telephone Encounter (Signed)
-----   Message from Boneta Sharps, MD sent at 10/19/2024  7:08 PM EST ----- Diagnosis:  1. Skin, left superior buttock :       DYSPLASTIC COMPOUND NEVUS WITH MODERATE ATYPIA, LIMITED MARGINS FREE        2. Skin, right upper lateral arm :       BASAL CELL CARCINOMA, NODULAR PATTERN   Please call to share diagnosis and discuss treatment options.  L buttock, biopsy shows moderately atypical mole, it is benign and no further treatment needed R upper arm, biopsy shows BCC, EDC vs excision

## 2024-10-20 NOTE — Telephone Encounter (Signed)
 Spoke with patient and advised of biopsy results. Patient voiced understanding to all. ED&C scheduled for 11/05/24.

## 2024-11-02 ENCOUNTER — Ambulatory Visit: Admitting: Pain Medicine

## 2024-11-05 ENCOUNTER — Ambulatory Visit: Admitting: Dermatology

## 2025-10-21 ENCOUNTER — Ambulatory Visit: Admitting: Dermatology
